# Patient Record
Sex: Female | Born: 1985 | Race: White | Hispanic: No | Marital: Married | Smoking: Never smoker
Health system: Southern US, Community
[De-identification: ages and names within clinical notes are randomized; demographics above are authoritative.]

## PROBLEM LIST (undated history)

## (undated) DIAGNOSIS — Z3490 Encounter for supervision of normal pregnancy, unspecified, unspecified trimester: Secondary | ICD-10-CM

## (undated) DIAGNOSIS — Z8619 Personal history of other infectious and parasitic diseases: Secondary | ICD-10-CM

## (undated) DIAGNOSIS — K219 Gastro-esophageal reflux disease without esophagitis: Secondary | ICD-10-CM

## (undated) DIAGNOSIS — G43909 Migraine, unspecified, not intractable, without status migrainosus: Secondary | ICD-10-CM

## (undated) DIAGNOSIS — F419 Anxiety disorder, unspecified: Secondary | ICD-10-CM

## (undated) HISTORY — DX: Migraine, unspecified, not intractable, without status migrainosus: G43.909

## (undated) HISTORY — PX: COLONOSCOPY: SHX174

## (undated) HISTORY — PX: OOPHORECTOMY: SHX86

## (undated) HISTORY — DX: Anxiety disorder, unspecified: F41.9

## (undated) HISTORY — PX: WISDOM TOOTH EXTRACTION: SHX21

## (undated) HISTORY — DX: Encounter for supervision of normal pregnancy, unspecified, unspecified trimester: Z34.90

## (undated) HISTORY — PX: OTHER SURGICAL HISTORY: SHX169

## (undated) HISTORY — DX: Personal history of other infectious and parasitic diseases: Z86.19

## (undated) HISTORY — PX: UPPER GI ENDOSCOPY: SHX6162

---

## 2012-09-19 LAB — HEPATIC FUNCTION PANEL
ALT: 9 U/L (ref 7–35)
AST: 12 U/L — AB (ref 13–35)
Alkaline Phosphatase: 40 U/L (ref 25–125)
BILIRUBIN, TOTAL: 0.7 mg/dL

## 2012-09-19 LAB — LIPID PANEL
Cholesterol: 188 mg/dL (ref 0–200)
HDL: 64 mg/dL (ref 35–70)
LDL Cholesterol: 105 mg/dL
TRIGLYCERIDES: 94 mg/dL (ref 40–160)

## 2012-09-19 LAB — CBC AND DIFFERENTIAL
HCT: 39 % (ref 36–46)
Hemoglobin: 13.2 g/dL (ref 12.0–16.0)
WBC: 5.6 10^3/mL

## 2012-09-19 LAB — BASIC METABOLIC PANEL
BUN: 11 mg/dL (ref 4–21)
CREATININE: 0.7 mg/dL (ref 0.5–1.1)
Glucose: 93 mg/dL
POTASSIUM: 4.2 mmol/L (ref 3.4–5.3)
SODIUM: 137 mmol/L (ref 137–147)

## 2012-09-19 LAB — TSH: TSH: 0.55 u[IU]/mL (ref 0.41–5.90)

## 2014-06-21 NOTE — L&D Delivery Note (Cosign Needed)
Delivery Note Pt pushed approx 2hrs and 10 mins and at 10:49 PM a viable female was delivered via Vaginal, Spontaneous Delivery (Presentation: ; Occiput Anterior).  APGAR: 8, 9; weight 7 lb 1.9 oz (3229 g).  Infant lifted to pt's abd and dried/bulb suctioned; cord clamped and cut by grandmother of baby. Placenta status: Intact, Spontaneous.  Cord: 3 vessels     Anesthesia: Epidural  Episiotomy: None Lacerations: 2nd degree;Vaginal Suture Repair: 3.0 vicryl Est. Blood Loss (mL): 150  Mom to postpartum.  Baby to Couplet care / Skin to Skin.   Serita Grammes CNM 04/04/2015, 11:22 PM

## 2014-08-20 ENCOUNTER — Ambulatory Visit (INDEPENDENT_AMBULATORY_CARE_PROVIDER_SITE_OTHER): Payer: Commercial Indemnity | Admitting: Adult Health

## 2014-08-20 ENCOUNTER — Encounter: Payer: Self-pay | Admitting: Adult Health

## 2014-08-20 VITALS — BP 124/90 | HR 72 | Ht 64.0 in | Wt 163.5 lb

## 2014-08-20 DIAGNOSIS — Z3490 Encounter for supervision of normal pregnancy, unspecified, unspecified trimester: Secondary | ICD-10-CM

## 2014-08-20 DIAGNOSIS — Z3201 Encounter for pregnancy test, result positive: Secondary | ICD-10-CM | POA: Diagnosis not present

## 2014-08-20 HISTORY — DX: Encounter for supervision of normal pregnancy, unspecified, unspecified trimester: Z34.90

## 2014-08-20 LAB — POCT URINE PREGNANCY: Preg Test, Ur: POSITIVE

## 2014-08-20 MED ORDER — PRENATAL PLUS 27-1 MG PO TABS: 1.0000 | ORAL_TABLET | Freq: Every day | ORAL | Status: DC

## 2014-08-20 NOTE — Progress Notes (Signed)
Subjective:     Patient ID: Teresa Bright, female   DOB: 05/12/1986, 29 y.o.   MRN: [id number]  HPI Teresa Bright is a 29 year old white female, married in for having missed a period, requesting a UPT.She is new to this practice.  Review of Systems  +missed period Patient denies any headaches, hearing loss, fatigue, blurred vision, shortness of breath, chest pain, abdominal pain, problems with bowel movements, urination, or intercourse. No joint pain or mood swings.She has some heartburn and has had before now.  Reviewed past medical,surgical, social and family history. Reviewed medications and allergies.     Objective:   Physical Exam BP 124/90 mmHg  Pulse 72  Ht 5\' 4"  (1.626 m)  Wt 163 lb 8 oz (74.163 kg)  BMI 28.05 kg/m2  LMP 01/13/2016UPT +, about 6+5 weeks by LMP with  EDD 04/10/15, Skin warm and dry.. Lungs: clear to ausculation bilaterally. Cardiovascular: regular rate and rhythm.Abdomen soft, non tender.    Assessment:     Pregnant +UPT    Plan:     Rx prenatal plus #30 1 daily with 11 refills Return in 2 weeks for dating [place] Review handout on first trimester and new OB packet

## 2014-08-20 NOTE — Patient Instructions (Signed)
First Trimester of Pregnancy The first trimester of pregnancy is from week 1 until the end of week 12 (months 1 through 3). A week after a sperm fertilizes an egg, the egg will implant on the wall of the uterus. This embryo will begin to develop into a baby. Genes from you and your partner are forming the baby. The female genes determine whether the baby is a boy or a girl. At 6-8 weeks, the eyes and face are formed, and the heartbeat can be seen on ultrasound. At the end of 12 weeks, all the baby's organs are formed.  Now that you are pregnant, you will want to do everything you can to have a healthy baby. Two of the most important things are to get good prenatal care and to follow your health care provider's instructions. Prenatal care is all the medical care you receive before the baby's birth. This care will help prevent, find, and treat any problems during the pregnancy and childbirth. BODY CHANGES Your body goes through many changes during pregnancy. The changes vary from woman to woman.   You may gain or lose a couple of pounds at first.  You may feel sick to your stomach (nauseous) and throw up (vomit). If the vomiting is uncontrollable, call your health care provider.  You may tire easily.  You may develop headaches that can be relieved by medicines approved by your health care provider.  You may urinate more often. Painful urination may mean you have a bladder infection.  You may develop heartburn as a result of your pregnancy.  You may develop constipation because certain hormones are causing the muscles that push waste through your intestines to slow down.  You may develop hemorrhoids or swollen, bulging veins (varicose veins).  Your breasts may begin to grow larger and become tender. Your nipples may stick out more, and the tissue that surrounds them (areola) may become darker.  Your gums may bleed and may be sensitive to brushing and flossing.  Dark spots or blotches (chloasma,  mask of pregnancy) may develop on your face. This will likely fade after the baby is born.  Your menstrual periods will stop.  You may have a loss of appetite.  You may develop cravings for certain kinds of food.  You may have changes in your emotions from day to day, such as being excited to be pregnant or being concerned that something may go wrong with the pregnancy and baby.  You may have more vivid and strange dreams.  You may have changes in your hair. These can include thickening of your hair, rapid growth, and changes in texture. Some women also have hair loss during or after pregnancy, or hair that feels dry or thin. Your hair will most likely return to normal after your baby is born. WHAT TO EXPECT AT YOUR PRENATAL VISITS During a routine prenatal visit:  You will be weighed to make sure you and the baby are growing normally.  Your blood pressure will be taken.  Your abdomen will be measured to track your baby's growth.  The fetal heartbeat will be listened to starting around week 10 or 12 of your pregnancy.  Test results from any previous visits will be discussed. Your health care provider may ask you:  How you are feeling.  If you are feeling the baby move.  If you have had any abnormal symptoms, such as leaking fluid, bleeding, severe headaches, or abdominal cramping.  If you have any questions. Other tests   that may be performed during your first trimester include:  Blood tests to find your blood type and to check for the presence of any previous infections. They will also be used to check for low iron levels (anemia) and Rh antibodies. Later in the pregnancy, blood tests for diabetes will be done along with other tests if problems develop.  Urine tests to check for infections, diabetes, or protein in the urine.  An ultrasound to confirm the proper growth and development of the baby.  An amniocentesis to check for possible genetic problems.  Fetal screens for  spina bifida and Down syndrome.  You may need other tests to make sure you and the baby are doing well. HOME CARE INSTRUCTIONS  Medicines  Follow your health care provider's instructions regarding medicine use. Specific medicines may be either safe or unsafe to take during pregnancy.  Take your prenatal vitamins as directed.  If you develop constipation, try taking a stool softener if your health care provider approves. Diet  Eat regular, well-balanced meals. Choose a variety of foods, such as meat or vegetable-based protein, fish, milk and low-fat dairy products, vegetables, fruits, and whole grain breads and cereals. Your health care provider will help you determine the amount of weight gain that is right for you.  Avoid raw meat and uncooked cheese. These carry germs that can cause birth defects in the baby.  Eating four or five small meals rather than three large meals a day may help relieve nausea and vomiting. If you start to feel nauseous, eating a few soda crackers can be helpful. Drinking liquids between meals instead of during meals also seems to help nausea and vomiting.  If you develop constipation, eat more high-fiber foods, such as fresh vegetables or fruit and whole grains. Drink enough fluids to keep your urine clear or pale yellow. Activity and Exercise  Exercise only as directed by your health care provider. Exercising will help you:  Control your weight.  Stay in shape.  Be prepared for labor and delivery.  Experiencing pain or cramping in the lower abdomen or low back is a good sign that you should stop exercising. Check with your health care provider before continuing normal exercises.  Try to avoid standing for long periods of time. Move your legs often if you must stand in one place for a long time.  Avoid heavy lifting.  Wear low-heeled shoes, and practice good posture.  You may continue to have sex unless your health care provider directs you  otherwise. Relief of Pain or Discomfort  Wear a good support bra for breast tenderness.   Take warm sitz baths to soothe any pain or discomfort caused by hemorrhoids. Use hemorrhoid cream if your health care provider approves.   Rest with your legs elevated if you have leg cramps or low back pain.  If you develop varicose veins in your legs, wear support hose. Elevate your feet for 15 minutes, 3-4 times a day. Limit salt in your diet. Prenatal Care  Schedule your prenatal visits by the twelfth week of pregnancy. They are usually scheduled monthly at first, then more often in the last 2 months before delivery.  Write down your questions. Take them to your prenatal visits.  Keep all your prenatal visits as directed by your health care provider. Safety  Wear your seat belt at all times when driving.  Make a list of emergency phone numbers, including numbers for family, friends, the hospital, and police and fire departments. General Tips    Ask your health care provider for a referral to a local prenatal education class. Begin classes no later than at the beginning of month 6 of your pregnancy.  Ask for help if you have counseling or nutritional needs during pregnancy. Your health care provider can offer advice or refer you to specialists for help with various needs.  Do not use hot tubs, steam rooms, or saunas.  Do not douche or use tampons or scented sanitary pads.  Do not cross your legs for long periods of time.  Avoid cat litter boxes and soil used by cats. These carry germs that can cause birth defects in the baby and possibly loss of the fetus by miscarriage or stillbirth.  Avoid all smoking, herbs, alcohol, and medicines not prescribed by your health care provider. Chemicals in these affect the formation and growth of the baby.  Schedule a dentist appointment. At home, brush your teeth with a soft toothbrush and be gentle when you floss. SEEK MEDICAL CARE IF:   You have  dizziness.  You have mild pelvic cramps, pelvic pressure, or nagging pain in the abdominal area.  You have persistent nausea, vomiting, or diarrhea.  You have a bad smelling vaginal discharge.  You have pain with urination.  You notice increased swelling in your face, hands, legs, or ankles. SEEK IMMEDIATE MEDICAL CARE IF:   You have a fever.  You are leaking fluid from your vagina.  You have spotting or bleeding from your vagina.  You have severe abdominal cramping or pain.  You have rapid weight gain or loss.  You vomit blood or material that looks like coffee grounds.  You are exposed to [place] measles and have never had them.  You are exposed to fifth disease or chickenpox.  You develop a severe headache.  You have shortness of breath.  You have any kind of trauma, such as from a fall or a car accident. Document Released: 06/01/2001 Document Revised: 10/22/2013 Document Reviewed: 04/17/2013 [facility] Patient Information 2015 [place], [place]. This information is not intended to replace advice given to you by your health care provider. Make sure you discuss any questions you have with your health care provider. Return in 2 weeks for dating [place]

## 2014-09-03 ENCOUNTER — Other Ambulatory Visit: Payer: Commercial Indemnity

## 2014-09-09 ENCOUNTER — Ambulatory Visit (INDEPENDENT_AMBULATORY_CARE_PROVIDER_SITE_OTHER): Payer: Commercial Indemnity

## 2014-09-09 ENCOUNTER — Other Ambulatory Visit: Payer: Self-pay | Admitting: Adult Health

## 2014-09-09 ENCOUNTER — Encounter: Payer: Self-pay | Admitting: Adult Health

## 2014-09-09 DIAGNOSIS — O3680X0 Pregnancy with inconclusive fetal viability, not applicable or unspecified: Secondary | ICD-10-CM | POA: Diagnosis not present

## 2014-09-09 DIAGNOSIS — Z3490 Encounter for supervision of normal pregnancy, unspecified, unspecified trimester: Secondary | ICD-10-CM

## 2014-09-09 NOTE — Progress Notes (Signed)
U/S(9+5wks)single fetus,  CRL c/w LMP dates, cx appears closed, bilateral adnexa appears WNL, FHR-180 bpm,

## 2014-09-19 ENCOUNTER — Telehealth: Payer: Self-pay | Admitting: *Deleted

## 2014-09-19 NOTE — Telephone Encounter (Signed)
Left message x 1. JSY 

## 2014-09-20 NOTE — Telephone Encounter (Signed)
Left message x 2. JSY 

## 2014-09-23 NOTE — Telephone Encounter (Signed)
Pt never returned call after leaving 3 messages. Encounter closed. Roland

## 2014-09-23 NOTE — Telephone Encounter (Signed)
Left message x 3. JSY 

## 2014-09-24 ENCOUNTER — Ambulatory Visit (INDEPENDENT_AMBULATORY_CARE_PROVIDER_SITE_OTHER): Payer: Commercial Indemnity | Admitting: Women's Health

## 2014-09-24 ENCOUNTER — Encounter: Payer: Self-pay | Admitting: Women's Health

## 2014-09-24 VITALS — BP 122/78 | HR 84 | Wt 166.0 lb

## 2014-09-24 DIAGNOSIS — Z3401 Encounter for supervision of normal first pregnancy, first trimester: Secondary | ICD-10-CM

## 2014-09-24 DIAGNOSIS — Z0283 Encounter for blood-alcohol and blood-drug test: Secondary | ICD-10-CM

## 2014-09-24 DIAGNOSIS — Z369 Encounter for antenatal screening, unspecified: Secondary | ICD-10-CM

## 2014-09-24 DIAGNOSIS — Z331 Pregnant state, incidental: Secondary | ICD-10-CM

## 2014-09-24 DIAGNOSIS — Z3682 Encounter for antenatal screening for nuchal translucency: Secondary | ICD-10-CM

## 2014-09-24 DIAGNOSIS — Z1389 Encounter for screening for other disorder: Secondary | ICD-10-CM

## 2014-09-24 DIAGNOSIS — Z3400 Encounter for supervision of normal first pregnancy, unspecified trimester: Secondary | ICD-10-CM | POA: Insufficient documentation

## 2014-09-24 DIAGNOSIS — Z124 Encounter for screening for malignant neoplasm of cervix: Secondary | ICD-10-CM

## 2014-09-24 LAB — OB RESULTS CONSOLE ABO/RH: RH TYPE: POSITIVE

## 2014-09-24 LAB — OB RESULTS CONSOLE HIV ANTIBODY (ROUTINE TESTING): HIV: NONREACTIVE

## 2014-09-24 LAB — OB RESULTS CONSOLE GC/CHLAMYDIA
CHLAMYDIA, DNA PROBE: NEGATIVE
Gonorrhea: NEGATIVE

## 2014-09-24 LAB — OB RESULTS CONSOLE RUBELLA ANTIBODY, IGM: RUBELLA: IMMUNE

## 2014-09-24 LAB — POCT URINALYSIS DIPSTICK
Glucose, UA: NEGATIVE
KETONES UA: NEGATIVE
Leukocytes, UA: NEGATIVE
Nitrite, UA: NEGATIVE
Protein, UA: NEGATIVE
RBC UA: NEGATIVE

## 2014-09-24 NOTE — Addendum Note (Signed)
Addended by: Traci Sermon A on: 09/24/2014 03:29 PM   Modules accepted: Orders

## 2014-09-24 NOTE — Patient Instructions (Signed)
Nausea & Vomiting  Have saltine crackers or pretzels by your bed and eat a few bites before you raise your head out of bed in the morning  Eat small frequent meals throughout the day instead of large meals  Drink plenty of fluids throughout the day to stay hydrated, just don't drink a lot of fluids with your meals.  This can make your stomach fill up faster making you feel sick  Do not brush your teeth right after you eat  Products with real ginger are good for nausea, like ginger ale and ginger hard candy Make sure it says made with real ginger!  Sucking on sour candy like lemon heads is also good for nausea  If your prenatal vitamins make you nauseated, take them at night so you will sleep through the nausea  Sea Bands  If you feel like you need medicine for the nausea & vomiting please let us know  If you are unable to keep any fluids or food down please let us know   First Trimester of Pregnancy The first trimester of pregnancy is from week 1 until the end of week 12 (months 1 through 3). A week after a sperm fertilizes an egg, the egg will implant on the wall of the uterus. This embryo will begin to develop into a baby. Genes from you and your partner are forming the baby. The female genes determine whether the baby is a boy or a girl. At 6-8 weeks, the eyes and face are formed, and the heartbeat can be seen on ultrasound. At the end of 12 weeks, all the baby's organs are formed.  Now that you are pregnant, you will want to do everything you can to have a healthy baby. Two of the most important things are to get good prenatal care and to follow your health care provider's instructions. Prenatal care is all the medical care you receive before the baby's birth. This care will help prevent, find, and treat any problems during the pregnancy and childbirth. BODY CHANGES Your body goes through many changes during pregnancy. The changes vary from woman to woman.   You may gain or lose a  couple of pounds at first.  You may feel sick to your stomach (nauseous) and throw up (vomit). If the vomiting is uncontrollable, call your health care provider.  You may tire easily.  You may develop headaches that can be relieved by medicines approved by your health care provider.  You may urinate more often. Painful urination may mean you have a bladder infection.  You may develop heartburn as a result of your pregnancy.  You may develop constipation because certain hormones are causing the muscles that push waste through your intestines to slow down.  You may develop hemorrhoids or swollen, bulging veins (varicose veins).  Your breasts may begin to grow larger and become tender. Your nipples may stick out more, and the tissue that surrounds them (areola) may become darker.  Your gums may bleed and may be sensitive to brushing and flossing.  Dark spots or blotches (chloasma, mask of pregnancy) may develop on your face. This will likely fade after the baby is born.  Your menstrual periods will stop.  You may have a loss of appetite.  You may develop cravings for certain kinds of food.  You may have changes in your emotions from day to day, such as being excited to be pregnant or being concerned that something may go wrong with the pregnancy and baby.  You may have more vivid and strange dreams.  You may have changes in your hair. These can include thickening of your hair, rapid growth, and changes in texture. Some women also have hair loss during or after pregnancy, or hair that feels dry or thin. Your hair will most likely return to normal after your baby is born. WHAT TO EXPECT AT YOUR PRENATAL VISITS During a routine prenatal visit:  You will be weighed to make sure you and the baby are growing normally.  Your blood pressure will be taken.  Your abdomen will be measured to track your baby's growth.  The fetal heartbeat will be listened to starting around week 10 or 12  of your pregnancy.  Test results from any previous visits will be discussed. Your health care provider may ask you:  How you are feeling.  If you are feeling the baby move.  If you have had any abnormal symptoms, such as leaking fluid, bleeding, severe headaches, or abdominal cramping.  If you have any questions. Other tests that may be performed during your first trimester include:  Blood tests to find your blood type and to check for the presence of any previous infections. They will also be used to check for low iron levels (anemia) and Rh antibodies. Later in the pregnancy, blood tests for diabetes will be done along with other tests if problems develop.  Urine tests to check for infections, diabetes, or protein in the urine.  An ultrasound to confirm the proper growth and development of the baby.  An amniocentesis to check for possible genetic problems.  Fetal screens for spina bifida and Down syndrome.  You may need other tests to make sure you and the baby are doing well. HOME CARE INSTRUCTIONS  Medicines  Follow your health care provider's instructions regarding medicine use. Specific medicines may be either safe or unsafe to take during pregnancy.  Take your prenatal vitamins as directed.  If you develop constipation, try taking a stool softener if your health care provider approves. Diet  Eat regular, well-balanced meals. Choose a variety of foods, such as meat or vegetable-based protein, fish, milk and low-fat dairy products, vegetables, fruits, and whole grain breads and cereals. Your health care provider will help you determine the amount of weight gain that is right for you.  Avoid raw meat and uncooked cheese. These carry germs that can cause birth defects in the baby.  Eating four or five small meals rather than three large meals a day may help relieve nausea and vomiting. If you start to feel nauseous, eating a few soda crackers can be helpful. Drinking liquids  between meals instead of during meals also seems to help nausea and vomiting.  If you develop constipation, eat more high-fiber foods, such as fresh vegetables or fruit and whole grains. Drink enough fluids to keep your urine clear or pale yellow. Activity and Exercise  Exercise only as directed by your health care provider. Exercising will help you:  Control your weight.  Stay in shape.  Be prepared for labor and delivery.  Experiencing pain or cramping in the lower abdomen or low back is a good sign that you should stop exercising. Check with your health care provider before continuing normal exercises.  Try to avoid standing for long periods of time. Move your legs often if you must stand in one place for a long time.  Avoid heavy lifting.  Wear low-heeled shoes, and practice good posture.  You may continue to have  sex unless your health care provider directs you otherwise. Relief of Pain or Discomfort  Wear a good support bra for breast tenderness.   Take warm sitz baths to soothe any pain or discomfort caused by hemorrhoids. Use hemorrhoid cream if your health care provider approves.   Rest with your legs elevated if you have leg cramps or low back pain.  If you develop varicose veins in your legs, wear support hose. Elevate your feet for 15 minutes, 3-4 times a day. Limit salt in your diet. Prenatal Care  Schedule your prenatal visits by the twelfth week of pregnancy. They are usually scheduled monthly at first, then more often in the last 2 months before delivery.  Write down your questions. Take them to your prenatal visits.  Keep all your prenatal visits as directed by your health care provider. Safety  Wear your seat belt at all times when driving.  Make a list of emergency phone numbers, including numbers for family, friends, the hospital, and police and fire departments. General Tips  Ask your health care provider for a referral to a local prenatal education  class. Begin classes no later than at the beginning of month 6 of your pregnancy.  Ask for help if you have counseling or nutritional needs during pregnancy. Your health care provider can offer advice or refer you to specialists for help with various needs.  Do not use hot tubs, steam rooms, or saunas.  Do not douche or use tampons or scented sanitary pads.  Do not cross your legs for long periods of time.  Avoid cat litter boxes and soil used by cats. These carry germs that can cause birth defects in the baby and possibly loss of the fetus by miscarriage or stillbirth.  Avoid all smoking, herbs, alcohol, and medicines not prescribed by your health care provider. Chemicals in these affect the formation and growth of the baby.  Schedule a dentist appointment. At home, brush your teeth with a soft toothbrush and be gentle when you floss. SEEK MEDICAL CARE IF:   You have dizziness.  You have mild pelvic cramps, pelvic pressure, or nagging pain in the abdominal area.  You have persistent nausea, vomiting, or diarrhea.  You have a bad smelling vaginal discharge.  You have pain with urination.  You notice increased swelling in your face, hands, legs, or ankles. SEEK IMMEDIATE MEDICAL CARE IF:   You have a fever.  You are leaking fluid from your vagina.  You have spotting or bleeding from your vagina.  You have severe abdominal cramping or pain.  You have rapid weight gain or loss.  You vomit blood or material that looks like coffee grounds.  You are exposed to [place] measles and have never had them.  You are exposed to fifth disease or chickenpox.  You develop a severe headache.  You have shortness of breath.  You have any kind of trauma, such as from a fall or a car accident. Document Released: 06/01/2001 Document Revised: 10/22/2013 Document Reviewed: 04/17/2013 [facility] Patient Information 2015 [place], [place]. This information is not intended to replace advice given  to you by your health care provider. Make sure you discuss any questions you have with your health care provider.

## 2014-09-24 NOTE — Progress Notes (Signed)
  Subjective:  Teresa Bright is a 29 y.o. G59P0 Caucasian female at [redacted]w[redacted]d by LMP c/w 9wk u/s, being seen today for her first obstetrical visit.  Her obstetrical history is significant for primigravida.  Pregnancy history fully reviewed.  Patient reports no complaints. Denies vb, cramping, uti s/s, abnormal/malodorous vag d/c, or vulvovaginal itching/irritation.  BP 122/78 mmHg  Pulse 84  Wt 166 lb (75.297 kg)  LMP 07/03/2014  HISTORY: OB History  Gravida Para Term Preterm AB SAB TAB Ectopic Multiple Living  1             # Outcome Date GA Lbr Len/2nd Weight Sex Delivery Anes PTL Lv  1 Current              Past Medical History  Diagnosis Date  . Pregnant 08/20/2014  . Medical history non-contributory    Past Surgical History  Procedure Laterality Date  . Wisdom tooth extraction     Family History  Problem Relation Age of Onset  . Cancer Maternal Grandmother     skin  . Diabetes Maternal Grandmother   . Stroke Maternal Grandfather   . Aneurysm Maternal Grandfather     brain  . Cancer Paternal Grandmother     bone and breast    Exam   System:     General: Well developed & nourished, no acute distress   Skin: Warm & dry, normal coloration and turgor, no rashes   Neurologic: Alert & oriented, normal mood   Cardiovascular: Regular rate & rhythm   Respiratory: Effort & rate normal, LCTAB, acyanotic   Abdomen: Soft, non tender   Extremities: normal strength, tone  Thin prep pap smear neg 2014 in [place]  FHR: 175 via doppler   Assessment:   Pregnancy: G1P0 Patient Active Problem List   Diagnosis Date Noted  . Supervision of normal first pregnancy 09/24/2014    Priority: High    [redacted]w[redacted]d G1P0 New OB visit  Plan:  Initial labs drawn Continue prenatal vitamins Problem list reviewed and updated Reviewed n/v relief measures and warning s/s to report Reviewed recommended weight gain based on pre-gravid BMI Encouraged well-balanced diet Genetic Screening  discussed Integrated Screen: requested Cystic fibrosis screening discussed requested Ultrasound discussed; fetal survey: requested Follow up in 1 weeks for 1st IT/NT and visit [place] completed  Tawnya Crook CNM, [facility] 09/24/2014 3:17 PM

## 2014-09-26 LAB — GC/CHLAMYDIA PROBE AMP
Chlamydia trachomatis, NAA: NEGATIVE
Neisseria gonorrhoeae by PCR: NEGATIVE

## 2014-09-26 LAB — URINE CULTURE: ORGANISM ID, BACTERIA: NO GROWTH

## 2014-09-28 LAB — PMP SCREEN PROFILE (10S), URINE
AMPHETAMINE SCRN UR: NEGATIVE ng/mL
BARBITURATE SCRN UR: NEGATIVE ng/mL
Benzodiazepine Screen, Urine: NEGATIVE ng/mL
Cannabinoids Ur Ql Scn: NEGATIVE ng/mL
Cocaine(Metab.)Screen, Urine: NEGATIVE ng/mL
Creatinine(Crt), U: 98.2 mg/dL (ref 20.0–300.0)
Methadone Scn, Ur: NEGATIVE ng/mL
Opiate Scrn, Ur: NEGATIVE ng/mL
Oxycodone+Oxymorphone Ur Ql Scn: NEGATIVE ng/mL
PCP Scrn, Ur: NEGATIVE ng/mL
PH UR, DRUG SCRN: 5.3 (ref 4.5–8.9)
PROPOXYPHENE SCREEN: NEGATIVE ng/mL

## 2014-09-28 LAB — URINALYSIS, ROUTINE W REFLEX MICROSCOPIC
Bilirubin, UA: NEGATIVE
Glucose, UA: NEGATIVE
KETONES UA: NEGATIVE
LEUKOCYTES UA: NEGATIVE
Nitrite, UA: NEGATIVE
Protein, UA: NEGATIVE
RBC, UA: NEGATIVE
SPEC GRAV UA: 1.017 (ref 1.005–1.030)
Urobilinogen, Ur: 0.2 mg/dL (ref 0.2–1.0)
pH, UA: 6 (ref 5.0–7.5)

## 2014-09-28 LAB — CBC
HCT: 39.4 % (ref 34.0–46.6)
HEMOGLOBIN: 12.8 g/dL (ref 11.1–15.9)
MCH: 29.2 pg (ref 26.6–33.0)
MCHC: 32.5 g/dL (ref 31.5–35.7)
MCV: 90 fL (ref 79–97)
Platelets: 271 10*3/uL (ref 150–379)
RBC: 4.39 x10E6/uL (ref 3.77–5.28)
RDW: 13.2 % (ref 12.3–15.4)
WBC: 8 10*3/uL (ref 3.4–10.8)

## 2014-09-28 LAB — ABO/RH: Rh Factor: POSITIVE

## 2014-09-28 LAB — ANTIBODY SCREEN: Antibody Screen: NEGATIVE

## 2014-09-28 LAB — HEPATITIS B SURFACE ANTIGEN: Hepatitis B Surface Ag: NEGATIVE

## 2014-09-28 LAB — VARICELLA ZOSTER ANTIBODY, IGG: Varicella zoster IgG: 2216 index (ref >165)

## 2014-09-28 LAB — RPR: RPR: NONREACTIVE

## 2014-09-28 LAB — CYSTIC FIBROSIS MUTATION 97: Interpretation: NOT DETECTED

## 2014-09-28 LAB — HIV ANTIBODY (ROUTINE TESTING W REFLEX): HIV Screen 4th Generation wRfx: NONREACTIVE

## 2014-09-28 LAB — RUBELLA SCREEN: RUBELLA: 2.43 {index} (ref >0.99)

## 2014-10-03 ENCOUNTER — Ambulatory Visit (INDEPENDENT_AMBULATORY_CARE_PROVIDER_SITE_OTHER): Payer: Commercial Indemnity | Admitting: Advanced Practice Midwife

## 2014-10-03 ENCOUNTER — Ambulatory Visit (INDEPENDENT_AMBULATORY_CARE_PROVIDER_SITE_OTHER): Payer: Commercial Indemnity

## 2014-10-03 ENCOUNTER — Encounter: Payer: Self-pay | Admitting: Advanced Practice Midwife

## 2014-10-03 VITALS — BP 120/60 | HR 80 | Wt 168.0 lb

## 2014-10-03 DIAGNOSIS — Z36 Encounter for antenatal screening of mother: Secondary | ICD-10-CM

## 2014-10-03 DIAGNOSIS — Z369 Encounter for antenatal screening, unspecified: Secondary | ICD-10-CM

## 2014-10-03 DIAGNOSIS — Z3401 Encounter for supervision of normal first pregnancy, first trimester: Secondary | ICD-10-CM

## 2014-10-03 DIAGNOSIS — Z3682 Encounter for antenatal screening for nuchal translucency: Secondary | ICD-10-CM

## 2014-10-03 DIAGNOSIS — O3481 Maternal care for other abnormalities of pelvic organs, first trimester: Secondary | ICD-10-CM

## 2014-10-03 DIAGNOSIS — Z331 Pregnant state, incidental: Secondary | ICD-10-CM

## 2014-10-03 DIAGNOSIS — N83201 Unspecified ovarian cyst, right side: Secondary | ICD-10-CM | POA: Insufficient documentation

## 2014-10-03 DIAGNOSIS — Z1389 Encounter for screening for other disorder: Secondary | ICD-10-CM

## 2014-10-03 DIAGNOSIS — N83209 Unspecified ovarian cyst, unspecified side: Secondary | ICD-10-CM

## 2014-10-03 LAB — POCT URINALYSIS DIPSTICK
Blood, UA: NEGATIVE
GLUCOSE UA: NEGATIVE
Ketones, UA: NEGATIVE
Leukocytes, UA: NEGATIVE
NITRITE UA: NEGATIVE
Protein, UA: NEGATIVE

## 2014-10-03 NOTE — Patient Instructions (Signed)
Ovarian Cyst (Simple) An ovarian cyst is a fluid-filled sac that forms on an ovary. The ovaries are small organs that produce eggs in women. Various types of cysts can form on the ovaries. Most are not cancerous. Many do not cause problems, and they often go away on their own. Some may cause symptoms and require treatment. Common types of ovarian cysts include:  Functional cysts--These cysts may occur every month during the menstrual cycle. This is normal. The cysts usually go away with the next menstrual cycle if the woman does not get pregnant. Usually, there are no symptoms with a functional cyst.  Endometrioma cysts--These cysts form from the tissue that lines the uterus. They are also called "chocolate cysts" because they become filled with blood that turns brown. This type of cyst can cause pain in the lower abdomen during intercourse and with your menstrual period.  Cystadenoma cysts--This type develops from the cells on the outside of the ovary. These cysts can get very big and cause lower abdomen pain and pain with intercourse. This type of cyst can twist on itself, cut off its blood supply, and cause severe pain. It can also easily rupture and cause a lot of pain.  Dermoid cysts--This type of cyst is sometimes found in both ovaries. These cysts may contain different kinds of body tissue, such as skin, teeth, hair, or cartilage. They usually do not cause symptoms unless they get very big.  Theca lutein cysts--These cysts occur when too much of a certain hormone (human chorionic gonadotropin) is produced and overstimulates the ovaries to produce an egg. This is most common after procedures used to assist with the conception of a baby (in vitro fertilization). CAUSES   Fertility drugs can cause a condition in which multiple large cysts are formed on the ovaries. This is called ovarian hyperstimulation syndrome.  A condition called polycystic ovary syndrome can cause hormonal imbalances that  can lead to nonfunctional ovarian cysts. SIGNS AND SYMPTOMS  Many ovarian cysts do not cause symptoms. If symptoms are present, they may include:  Pelvic pain or pressure.  Pain in the lower abdomen.  Pain during sexual intercourse.  Increasing girth (swelling) of the abdomen.  Abnormal menstrual periods.  Increasing pain with menstrual periods.  Stopping having menstrual periods without being pregnant. DIAGNOSIS  These cysts are commonly found during a routine or annual pelvic exam. Tests may be ordered to find out more about the cyst. These tests may include:  Ultrasound.  X-ray of the pelvis.  CT scan.  MRI.  Blood tests. TREATMENT  Many ovarian cysts go away on their own without treatment. Your health care provider may want to check your cyst regularly for 2-3 months to see if it changes. For women in menopause, it is particularly important to monitor a cyst closely because of the higher rate of ovarian cancer in menopausal women. When treatment is needed, it may include any of the following:  A procedure to drain the cyst (aspiration). This may be done using a long needle and ultrasound. It can also be done through a laparoscopic procedure. This involves using a thin, lighted tube with a tiny camera on the end (laparoscope) inserted through a small incision.  Surgery to remove the whole cyst. This may be done using laparoscopic surgery or an open surgery involving a larger incision in the lower abdomen.  Hormone treatment or birth control pills. These methods are sometimes used to help dissolve a cyst. HOME CARE INSTRUCTIONS   Only take  over-the-counter or prescription medicines as directed by your health care provider.  Follow up with your health care provider as directed.  Get regular pelvic exams and Pap tests. SEEK MEDICAL CARE IF:   Your periods are late, irregular, or painful, or they stop.  Your pelvic pain or abdominal pain does not go away.  Your  abdomen becomes larger or swollen.  You have pressure on your bladder or trouble emptying your bladder completely.  You have pain during sexual intercourse.  You have feelings of fullness, pressure, or discomfort in your stomach.  You lose weight for no apparent reason.  You feel generally ill.  You become constipated.  You lose your appetite.  You develop acne.  You have an increase in body and facial hair.  You are gaining weight, without changing your exercise and eating habits.  You think you are pregnant. SEEK IMMEDIATE MEDICAL CARE IF:   You have increasing abdominal pain.  You feel sick to your stomach (nauseous), and you throw up (vomit).  You develop a fever that comes on suddenly.  You have abdominal pain during a bowel movement.  Your menstrual periods become heavier than usual. MAKE SURE YOU:  Understand these instructions.  Will watch your condition.  Will get help right away if you are not doing well or get worse. Document Released: 06/07/2005 Document Revised: 06/12/2013 Document Reviewed: 02/12/2013 [facility] Patient Information 2015 [place], [place]. This information is not intended to replace advice given to you by your health care provider. Make sure you discuss any questions you have with your health care provider.

## 2014-10-03 NOTE — Progress Notes (Signed)
Pt denies any problems or concerns at this time.  

## 2014-10-03 NOTE — Progress Notes (Signed)
G1P0 [redacted]w[redacted]d Estimated Date of Delivery: 04/09/15  Last menstrual period 07/03/2014.   BP weight and urine results all reviewed and noted.  Please refer to the obstetrical flow sheet for the fundal height and fetal heart rate documentation: NT/IT today:  [place] [redacted]w[redacted]d c/w dates,nt 1.20mm,nb present,RT ov simple cyst 11.1x7.4x9.6cm,normal lt ov,pos fht 158bpm  Patient denies any bleeding and no rupture of membranes symptoms or regular contractions. She is completely asymptomatic as far as the cyst goes Patient is without complaints. All questions were answered.  Plan:  Continued routine obstetrical care  Follow up in 4 weeks for OB appointment, 2nd IT

## 2014-10-03 NOTE — Progress Notes (Signed)
[place] [redacted]w[redacted]d c/w dates,nt 1.64mm,nb present,RT ov simple cyst 11.1x7.4x9.6cm,normal lt ov,pos fht 158bpm

## 2014-10-05 LAB — MATERNAL SCREEN, INTEGRATED #1
CROWN RUMP LENGTH MAT SCREEN: 71.2 mm
GEST. AGE ON COLLECTION DATE: 13 wk
Maternal Age at EDD: 30 years
Nuchal Translucency (NT): 1.4 mm
Number of Fetuses: 1
PAPP-A Value: 361.5 ng/mL
Weight: 168 [lb_av]

## 2014-10-31 ENCOUNTER — Ambulatory Visit (INDEPENDENT_AMBULATORY_CARE_PROVIDER_SITE_OTHER): Payer: Commercial Indemnity | Admitting: Advanced Practice Midwife

## 2014-10-31 VITALS — BP 112/78 | HR 72 | Wt 177.0 lb

## 2014-10-31 DIAGNOSIS — Z3402 Encounter for supervision of normal first pregnancy, second trimester: Secondary | ICD-10-CM

## 2014-10-31 DIAGNOSIS — Z363 Encounter for antenatal screening for malformations: Secondary | ICD-10-CM

## 2014-10-31 DIAGNOSIS — Z1389 Encounter for screening for other disorder: Secondary | ICD-10-CM

## 2014-10-31 DIAGNOSIS — Z331 Pregnant state, incidental: Secondary | ICD-10-CM

## 2014-10-31 LAB — POCT URINALYSIS DIPSTICK
Blood, UA: NEGATIVE
Glucose, UA: NEGATIVE
KETONES UA: NEGATIVE
Nitrite, UA: NEGATIVE
Protein, UA: NEGATIVE

## 2014-10-31 NOTE — Patient Instructions (Signed)
[place] [employer] based

## 2014-10-31 NOTE — Addendum Note (Signed)
Addended by: Doyne Keel on: 10/31/2014 02:23 PM   Modules accepted: Orders

## 2014-10-31 NOTE — Progress Notes (Signed)
G1P0 [redacted]w[redacted]d Estimated Date of Delivery: 04/09/15  Blood pressure 112/78, pulse 72, weight 177 lb (80.287 kg), last menstrual period 07/03/2014.   BP weight and urine results all reviewed and noted.  Please refer to the obstetrical flow sheet for the fundal height and fetal heart rate documentation:  Patient denies any bleeding and no rupture of membranes symptoms or regular contractions. Patient is without complaints. All questions were answered.  Plan:  Continued routine obstetrical care, 2nd it  Follow up in 2 weeks for OB appointment, anatomy scan

## 2014-11-02 LAB — MATERNAL SCREEN, INTEGRATED #2
ADSF: 1.18
AFP MARKER: 27.8 ng/mL
AFP MoM: 0.87
CROWN RUMP LENGTH: 71.2 mm
DIA MOM: 0.29
DIA VALUE: 46.6 pg/mL
Estriol, Unconjugated: 1.18 ng/mL
Gest. Age on Collection Date: 13 weeks
Gestational Age: 17 weeks
MATERNAL AGE AT EDD: 30 a
NUMBER OF FETUSES: 1
Nuchal Translucency (NT): 1.4 mm
Nuchal Translucency MoM: 0.88
PAPP-A MoM: 0.34
PAPP-A Value: 361.5 ng/mL
Test Results:: NEGATIVE
Weight: 168 [lb_av]
Weight: 177 [lb_av]
hCG MoM: 0.49
hCG Value: 12.5 IU/mL

## 2014-11-14 ENCOUNTER — Ambulatory Visit (INDEPENDENT_AMBULATORY_CARE_PROVIDER_SITE_OTHER): Payer: Commercial Indemnity

## 2014-11-14 ENCOUNTER — Ambulatory Visit (INDEPENDENT_AMBULATORY_CARE_PROVIDER_SITE_OTHER): Payer: Commercial Indemnity | Admitting: Advanced Practice Midwife

## 2014-11-14 ENCOUNTER — Encounter: Payer: Self-pay | Admitting: Advanced Practice Midwife

## 2014-11-14 ENCOUNTER — Other Ambulatory Visit: Payer: Self-pay | Admitting: Advanced Practice Midwife

## 2014-11-14 VITALS — BP 120/70 | HR 84 | Wt 169.0 lb

## 2014-11-14 DIAGNOSIS — Z331 Pregnant state, incidental: Secondary | ICD-10-CM

## 2014-11-14 DIAGNOSIS — Z36 Encounter for antenatal screening of mother: Secondary | ICD-10-CM | POA: Diagnosis not present

## 2014-11-14 DIAGNOSIS — O3482 Maternal care for other abnormalities of pelvic organs, second trimester: Principal | ICD-10-CM

## 2014-11-14 DIAGNOSIS — N83201 Unspecified ovarian cyst, right side: Secondary | ICD-10-CM

## 2014-11-14 DIAGNOSIS — Z3402 Encounter for supervision of normal first pregnancy, second trimester: Secondary | ICD-10-CM

## 2014-11-14 DIAGNOSIS — N83209 Unspecified ovarian cyst, unspecified side: Secondary | ICD-10-CM

## 2014-11-14 DIAGNOSIS — Z1389 Encounter for screening for other disorder: Secondary | ICD-10-CM

## 2014-11-14 DIAGNOSIS — Z363 Encounter for antenatal screening for malformations: Secondary | ICD-10-CM

## 2014-11-14 LAB — POCT URINALYSIS DIPSTICK
Blood, UA: NEGATIVE
Glucose, UA: NEGATIVE
KETONES UA: NEGATIVE
LEUKOCYTES UA: NEGATIVE
Nitrite, UA: NEGATIVE
PROTEIN UA: NEGATIVE

## 2014-11-14 NOTE — Progress Notes (Signed)
Pt denies any problems or concerns at this time.  

## 2014-11-14 NOTE — Progress Notes (Signed)
[place] 19+1wks measurements c/w dates,simple rt ov cyst 9.2 x 8.6 x 7.6cm venous and art flow seen,fht 154bpm,sdp of fluid 5.1cm,4.7 cm cx, fundal pl gr 0,limited view of spine,no obvious abn seen,efw 293g

## 2014-11-14 NOTE — Progress Notes (Signed)
G1P0 [redacted]w[redacted]d Estimated Date of Delivery: 04/09/15  Last menstrual period 07/03/2014.   BP weight and urine results all reviewed and noted.  Please refer to the obstetrical flow sheet for the fundal height and fetal heart rate documentation: Anatomy scan today:  Normal appearing female, but couldn't see all of spine.  Cyst decreased to 9cms  Patient reports good fetal movement, denies any bleeding and no rupture of membranes symptoms or regular contractions. Patient is without complaints. All questions were answered.  Plan:  Continued routine obstetrical care, recheck anatomy 28 weeks  Follow up in 4 weeks for OB appointment,

## 2014-11-16 LAB — US OB COMP + 14 WK

## 2014-12-12 ENCOUNTER — Ambulatory Visit (INDEPENDENT_AMBULATORY_CARE_PROVIDER_SITE_OTHER): Payer: Commercial Indemnity | Admitting: Obstetrics & Gynecology

## 2014-12-12 ENCOUNTER — Encounter: Payer: Self-pay | Admitting: Obstetrics & Gynecology

## 2014-12-12 VITALS — BP 128/80 | HR 80 | Wt 178.0 lb

## 2014-12-12 DIAGNOSIS — Z1389 Encounter for screening for other disorder: Secondary | ICD-10-CM

## 2014-12-12 DIAGNOSIS — Z331 Pregnant state, incidental: Secondary | ICD-10-CM

## 2014-12-12 DIAGNOSIS — Z3492 Encounter for supervision of normal pregnancy, unspecified, second trimester: Secondary | ICD-10-CM

## 2014-12-12 LAB — POCT URINALYSIS DIPSTICK
Blood, UA: NEGATIVE
Glucose, UA: NEGATIVE
Ketones, UA: NEGATIVE
Leukocytes, UA: NEGATIVE
Nitrite, UA: NEGATIVE
Protein, UA: NEGATIVE

## 2014-12-12 NOTE — Progress Notes (Signed)
G1P0 [redacted]w[redacted]d Estimated Date of Delivery: 04/09/15  Blood pressure 128/80, pulse 80, weight 178 lb (80.74 kg), last menstrual period 07/03/2014.   BP weight and urine results all reviewed and noted.  Please refer to the obstetrical flow sheet for the fundal height and fetal heart rate documentation:  Patient reports good fetal movement, denies any bleeding and no rupture of membranes symptoms or regular contractions. Patient is without complaints. All questions were answered.  Plan:  Continued routine obstetrical care,   Follow up in 4 weeks for OB appointment,

## 2015-01-08 ENCOUNTER — Other Ambulatory Visit: Payer: Self-pay | Admitting: Obstetrics & Gynecology

## 2015-01-08 DIAGNOSIS — IMO0002 Reserved for concepts with insufficient information to code with codable children: Secondary | ICD-10-CM

## 2015-01-08 DIAGNOSIS — Z0489 Encounter for examination and observation for other specified reasons: Secondary | ICD-10-CM

## 2015-01-09 ENCOUNTER — Ambulatory Visit (INDEPENDENT_AMBULATORY_CARE_PROVIDER_SITE_OTHER): Payer: Commercial Indemnity | Admitting: Advanced Practice Midwife

## 2015-01-09 ENCOUNTER — Other Ambulatory Visit: Payer: Self-pay | Admitting: Obstetrics & Gynecology

## 2015-01-09 ENCOUNTER — Ambulatory Visit (INDEPENDENT_AMBULATORY_CARE_PROVIDER_SITE_OTHER): Payer: Commercial Indemnity

## 2015-01-09 ENCOUNTER — Other Ambulatory Visit: Payer: Commercial Indemnity

## 2015-01-09 VITALS — BP 114/56 | HR 71 | Wt 184.0 lb

## 2015-01-09 DIAGNOSIS — O3482 Maternal care for other abnormalities of pelvic organs, second trimester: Principal | ICD-10-CM

## 2015-01-09 DIAGNOSIS — Z331 Pregnant state, incidental: Secondary | ICD-10-CM

## 2015-01-09 DIAGNOSIS — Z36 Encounter for antenatal screening of mother: Secondary | ICD-10-CM

## 2015-01-09 DIAGNOSIS — IMO0002 Reserved for concepts with insufficient information to code with codable children: Secondary | ICD-10-CM

## 2015-01-09 DIAGNOSIS — N83201 Unspecified ovarian cyst, right side: Secondary | ICD-10-CM

## 2015-01-09 DIAGNOSIS — Z131 Encounter for screening for diabetes mellitus: Secondary | ICD-10-CM

## 2015-01-09 DIAGNOSIS — N83209 Unspecified ovarian cyst, unspecified side: Secondary | ICD-10-CM

## 2015-01-09 DIAGNOSIS — Z0489 Encounter for examination and observation for other specified reasons: Secondary | ICD-10-CM

## 2015-01-09 DIAGNOSIS — Z3402 Encounter for supervision of normal first pregnancy, second trimester: Secondary | ICD-10-CM

## 2015-01-09 DIAGNOSIS — Z369 Encounter for antenatal screening, unspecified: Secondary | ICD-10-CM

## 2015-01-09 DIAGNOSIS — N8320 Unspecified ovarian cysts: Secondary | ICD-10-CM | POA: Diagnosis not present

## 2015-01-09 DIAGNOSIS — Z1389 Encounter for screening for other disorder: Secondary | ICD-10-CM

## 2015-01-09 LAB — POCT URINALYSIS DIPSTICK
Blood, UA: NEGATIVE
KETONES UA: NEGATIVE
NITRITE UA: NEGATIVE

## 2015-01-09 NOTE — Progress Notes (Signed)
G1P0 [redacted]w[redacted]d Estimated Date of Delivery: 04/09/15  Blood pressure 114/56, pulse 71, weight 184 lb (83.462 kg), last menstrual period 07/03/2014.   BP weight and urine results all reviewed and noted.  Please refer to the obstetrical flow sheet for the fundal height and fetal heart rate documentation:Us 27+1wks,measurements c/w dates,post fundal pl,spinal anatomy complete w/no obvious abn seen, simple rt ov cyst w/venous and art flow seen 9.7x 7.3 x 9.4cm,normal lt ov,sdp of fluid 3.3HL,KT 4.4cm,cephalic,fht 625WLS  Patient reports good fetal movement, denies any bleeding and no rupture of membranes symptoms or regular contractions. Patient is without complaints. All questions were answered.  Plan:  Continued routine obstetrical care, PN2 today  Follow up in 3 weeks for OB appointment,

## 2015-01-09 NOTE — Progress Notes (Signed)
[place] 27+1wks,measurements c/w dates,post fundal pl,spinal anatomy complete w/no obvious abn seen, simple rt ov cyst w/venous and art flow seen 9.7x 7.3 x 9.4cm,normal lt ov,sdp of fluid 9.7QB,HA 4.4cm,cephalic,fht 193XTK

## 2015-01-10 LAB — GLUCOSE TOLERANCE, 2 HOURS W/ 1HR
GLUCOSE, 1 HOUR: 161 mg/dL (ref 65–179)
GLUCOSE, FASTING: 77 mg/dL (ref 65–91)
Glucose, 2 hour: 113 mg/dL (ref 65–152)

## 2015-01-10 LAB — CBC
HEMATOCRIT: 36.3 % (ref 34.0–46.6)
Hemoglobin: 12.2 g/dL (ref 11.1–15.9)
MCH: 30.3 pg (ref 26.6–33.0)
MCHC: 33.6 g/dL (ref 31.5–35.7)
MCV: 90 fL (ref 79–97)
Platelets: 239 10*3/uL (ref 150–379)
RBC: 4.02 x10E6/uL (ref 3.77–5.28)
RDW: 13.3 % (ref 12.3–15.4)
WBC: 8 10*3/uL (ref 3.4–10.8)

## 2015-01-10 LAB — HSV 2 ANTIBODY, IGG

## 2015-01-10 LAB — HIV ANTIBODY (ROUTINE TESTING W REFLEX): HIV SCREEN 4TH GENERATION: NONREACTIVE

## 2015-01-10 LAB — RPR: RPR: NONREACTIVE

## 2015-01-10 LAB — ANTIBODY SCREEN: Antibody Screen: NEGATIVE

## 2015-01-14 ENCOUNTER — Telehealth: Payer: Self-pay | Admitting: Women's Health

## 2015-01-22 ENCOUNTER — Telehealth: Payer: Self-pay | Admitting: Women's Health

## 2015-01-22 NOTE — Telephone Encounter (Signed)
Pt states what can she take for allergies at 29 weeks of pregnancy. Pt informed she can take OTC Zyrtec or Claritin. Pt verbalized understanding.

## 2015-01-29 ENCOUNTER — Ambulatory Visit (INDEPENDENT_AMBULATORY_CARE_PROVIDER_SITE_OTHER): Payer: Commercial Indemnity | Admitting: Women's Health

## 2015-01-29 ENCOUNTER — Encounter: Payer: Self-pay | Admitting: Women's Health

## 2015-01-29 VITALS — BP 102/64 | HR 80 | Wt 192.0 lb

## 2015-01-29 DIAGNOSIS — Z1389 Encounter for screening for other disorder: Secondary | ICD-10-CM

## 2015-01-29 DIAGNOSIS — Z3403 Encounter for supervision of normal first pregnancy, third trimester: Secondary | ICD-10-CM

## 2015-01-29 DIAGNOSIS — Z331 Pregnant state, incidental: Secondary | ICD-10-CM

## 2015-01-29 LAB — POCT URINALYSIS DIPSTICK
Blood, UA: NEGATIVE
Glucose, UA: NEGATIVE
Ketones, UA: NEGATIVE
Nitrite, UA: NEGATIVE
PROTEIN UA: NEGATIVE

## 2015-01-29 NOTE — Patient Instructions (Signed)
[place] Pediatricians/Family Doctors:  [facility] [phone]                 [place] [phone] (usually not accepting new patients unless you have family there already, you are always welcome to call and ask)            [facility] ([place]) [phone]   [facility] Pediatricians/Family Doctors:   [place]: [phone]  [facility]: [phone]   Call the office [phone]) or go to [facility] if:  You begin to have strong, frequent contractions  Your water breaks.  Sometimes it is a big gush of fluid, sometimes it is just a trickle that keeps getting your panties wet or running down your legs  You have vaginal bleeding.  It is normal to have a small amount of spotting if your cervix was checked.   You don't feel your baby moving like normal.  If you don't, get you something to eat and drink and lay down and focus on feeling your baby move.  You should feel at least 10 movements in 2 hours.  If you don't, you should call the office or go to [facility].    Tdap Vaccine  It is recommended that you get the Tdap vaccine during the third trimester of EACH pregnancy to help protect your baby from getting pertussis (whooping cough)  27-36 weeks is the BEST time to do this so that you can pass the protection on to your baby. During pregnancy is better than after pregnancy, but if you are unable to get it during pregnancy it will be offered at the hospital.   You can get this vaccine at the health department or your family doctor  Everyone who will be around your baby should also be up-to-date on their vaccines. Adults (who are not pregnant) only need 1 dose of Tdap during adulthood.   Third Trimester of Pregnancy The third trimester is from week 29 through week 42, months 7 through 9. The third trimester is a time when the fetus  is growing rapidly. At the end of the ninth month, the fetus is about 20 inches in length and weighs 6-10 pounds.  BODY CHANGES Your body goes through many changes during pregnancy. The changes vary from woman to woman.   Your weight will continue to increase. You can expect to gain 25-35 pounds (11-16 kg) by the end of the pregnancy.  You may begin to get stretch marks on your hips, abdomen, and breasts.  You may urinate more often because the fetus is moving lower into your pelvis and pressing on your bladder.  You may develop or continue to have heartburn as a result of your pregnancy.  You may develop constipation because certain hormones are causing the muscles that push waste through your intestines to slow down.  You may develop hemorrhoids or swollen, bulging veins (varicose veins).  You may have pelvic pain because of the weight gain and pregnancy hormones relaxing your joints between the bones in your pelvis. Backaches may result from overexertion of the muscles supporting your posture.  You may have changes in your hair. These can include thickening of your hair, rapid growth, and changes in texture. Some women also have hair loss during or after pregnancy, or hair that feels dry or thin. Your hair will most likely return to normal after your  baby is born.  Your breasts will continue to grow and be tender. A yellow discharge may leak from your breasts called colostrum.  Your belly button may stick out.  You may feel short of breath because of your expanding uterus.  You may notice the fetus "dropping," or moving lower in your abdomen.  You may have a bloody mucus discharge. This usually occurs a few days to a week before labor begins.  Your cervix becomes thin and soft (effaced) near your due date. WHAT TO EXPECT AT YOUR PRENATAL EXAMS  You will have prenatal exams every 2 weeks until week 36. Then, you will have weekly prenatal exams. During a routine prenatal  visit:  You will be weighed to make sure you and the fetus are growing normally.  Your blood pressure is taken.  Your abdomen will be measured to track your baby's growth.  The fetal heartbeat will be listened to.  Any test results from the previous visit will be discussed.  You may have a cervical check near your due date to see if you have effaced. At around 36 weeks, your caregiver will check your cervix. At the same time, your caregiver will also perform a test on the secretions of the vaginal tissue. This test is to determine if a type of bacteria, Group B streptococcus, is present. Your caregiver will explain this further. Your caregiver may ask you:  What your birth plan is.  How you are feeling.  If you are feeling the baby move.  If you have had any abnormal symptoms, such as leaking fluid, bleeding, severe headaches, or abdominal cramping.  If you have any questions. Other tests or screenings that may be performed during your third trimester include:  Blood tests that check for low iron levels (anemia).  Fetal testing to check the health, activity level, and growth of the fetus. Testing is done if you have certain medical conditions or if there are problems during the pregnancy. FALSE LABOR You may feel small, irregular contractions that eventually go away. These are called Braxton Hicks contractions, or false labor. Contractions may last for hours, days, or even weeks before true labor sets in. If contractions come at regular intervals, intensify, or become painful, it is best to be seen by your caregiver.  SIGNS OF LABOR   Menstrual-like cramps.  Contractions that are 5 minutes apart or less.  Contractions that start on the top of the uterus and spread down to the lower abdomen and back.  A sense of increased pelvic pressure or back pain.  A watery or bloody mucus discharge that comes from the vagina. If you have any of these signs before the 37th week of  pregnancy, call your caregiver right away. You need to go to the hospital to get checked immediately. HOME CARE INSTRUCTIONS   Avoid all smoking, herbs, alcohol, and unprescribed drugs. These chemicals affect the formation and growth of the baby.  Follow your caregiver's instructions regarding medicine use. There are medicines that are either safe or unsafe to take during pregnancy.  Exercise only as directed by your caregiver. Experiencing uterine cramps is a good sign to stop exercising.  Continue to eat regular, healthy meals.  Wear a good support bra for breast tenderness.  Do not use hot tubs, steam rooms, or saunas.  Wear your seat belt at all times when driving.  Avoid raw meat, uncooked cheese, cat litter boxes, and soil used by cats. These carry germs that can cause birth defects  in the baby.  Take your prenatal vitamins.  Try taking a stool softener (if your caregiver approves) if you develop constipation. Eat more high-fiber foods, such as fresh vegetables or fruit and whole grains. Drink plenty of fluids to keep your urine clear or pale yellow.  Take warm sitz baths to soothe any pain or discomfort caused by hemorrhoids. Use hemorrhoid cream if your caregiver approves.  If you develop varicose veins, wear support hose. Elevate your feet for 15 minutes, 3-4 times a day. Limit salt in your diet.  Avoid heavy lifting, wear low heal shoes, and practice good posture.  Rest a lot with your legs elevated if you have leg cramps or low back pain.  Visit your dentist if you have not gone during your pregnancy. Use a soft toothbrush to brush your teeth and be gentle when you floss.  A sexual relationship may be continued unless your caregiver directs you otherwise.  Do not travel far distances unless it is absolutely necessary and only with the approval of your caregiver.  Take prenatal classes to understand, practice, and ask questions about the labor and delivery.  Make a  trial run to the hospital.  Pack your hospital bag.  Prepare the baby's nursery.  Continue to go to all your prenatal visits as directed by your caregiver. SEEK MEDICAL CARE IF:  You are unsure if you are in labor or if your water has broken.  You have dizziness.  You have mild pelvic cramps, pelvic pressure, or nagging pain in your abdominal area.  You have persistent nausea, vomiting, or diarrhea.  You have a bad smelling vaginal discharge.  You have pain with urination. SEEK IMMEDIATE MEDICAL CARE IF:   You have a fever.  You are leaking fluid from your vagina.  You have spotting or bleeding from your vagina.  You have severe abdominal cramping or pain.  You have rapid weight loss or gain.  You have shortness of breath with chest pain.  You notice sudden or extreme swelling of your face, hands, ankles, feet, or legs.  You have not felt your baby move in over an hour.  You have severe headaches that do not go away with medicine.  You have vision changes. Document Released: 06/01/2001 Document Revised: 06/12/2013 Document Reviewed: 08/08/2012 [facility] Patient Information 2015 [place], [place]. This information is not intended to replace advice given to you by your health care provider. Make sure you discuss any questions you have with your health care provider.

## 2015-01-29 NOTE — Progress Notes (Signed)
Low-risk OB appointment G1P0 [redacted]w[redacted]d Estimated Date of Delivery: 04/09/15 BP 102/64 mmHg  Pulse 80  Wt 192 lb (87.091 kg)  LMP 07/03/2014  BP, weight, and urine reviewed.  Refer to obstetrical flow sheet for FH & FHR.  Reports good fm.  Denies regular uc's, lof, vb, or uti s/s. No complaints. Reviewed pn2 results, ptl s/s, fkc. Plan:  Continue routine obstetrical care  F/U in 2wks for OB appointment

## 2015-02-12 ENCOUNTER — Ambulatory Visit (INDEPENDENT_AMBULATORY_CARE_PROVIDER_SITE_OTHER): Payer: Commercial Indemnity | Admitting: Advanced Practice Midwife

## 2015-02-12 VITALS — BP 120/86 | HR 84 | Wt 194.0 lb

## 2015-02-12 DIAGNOSIS — Z3403 Encounter for supervision of normal first pregnancy, third trimester: Secondary | ICD-10-CM

## 2015-02-13 NOTE — Progress Notes (Signed)
G1P0 [redacted]w[redacted]d Estimated Date of Delivery: 04/09/15  Blood pressure 120/86, pulse 84, weight 194 lb (87.998 kg), last menstrual period 07/03/2014.   BP weight and urine results all reviewed and noted.  Please refer to the obstetrical flow sheet for the fundal height and fetal heart rate documentation:  Patient reports good fetal movement, denies any bleeding and no rupture of membranes symptoms or regular contractions. Patient is without complaints. All questions were answered.  Plan:  Continued routine obstetrical care,   Follow up in 2 weeks for OB appointment,

## 2015-02-25 ENCOUNTER — Encounter: Payer: Self-pay | Admitting: Women's Health

## 2015-02-25 ENCOUNTER — Ambulatory Visit (INDEPENDENT_AMBULATORY_CARE_PROVIDER_SITE_OTHER): Payer: Commercial Indemnity | Admitting: Women's Health

## 2015-02-25 VITALS — BP 110/62 | HR 88 | Wt 195.0 lb

## 2015-02-25 DIAGNOSIS — Z331 Pregnant state, incidental: Secondary | ICD-10-CM

## 2015-02-25 DIAGNOSIS — Z1389 Encounter for screening for other disorder: Secondary | ICD-10-CM

## 2015-02-25 DIAGNOSIS — Z3403 Encounter for supervision of normal first pregnancy, third trimester: Secondary | ICD-10-CM

## 2015-02-25 LAB — POCT URINALYSIS DIPSTICK
Blood, UA: NEGATIVE
GLUCOSE UA: NEGATIVE
Ketones, UA: NEGATIVE
Nitrite, UA: NEGATIVE
PROTEIN UA: NEGATIVE

## 2015-02-25 NOTE — Patient Instructions (Signed)
Call the office ([phone]) or go to [facility] if:  You begin to have strong, frequent contractions  Your water breaks.  Sometimes it is a big gush of fluid, sometimes it is just a trickle that keeps getting your panties wet or running down your legs  You have vaginal bleeding.  It is normal to have a small amount of spotting if your cervix was checked.   You don't feel your baby moving like normal.  If you don't, get you something to eat and drink and lay down and focus on feeling your baby move.  You should feel at least 10 movements in 2 hours.  If you don't, you should call the office or go to [facility].    Preterm Labor Information Preterm labor is when labor starts at less than 37 weeks of pregnancy. The normal length of a pregnancy is 39 to 41 weeks. CAUSES Often, there is no identifiable underlying cause as to why a woman goes into preterm labor. One of the most common known causes of preterm labor is infection. Infections of the uterus, cervix, vagina, amniotic sac, bladder, kidney, or even the lungs (pneumonia) can cause labor to start. Other suspected causes of preterm labor include:   Urogenital infections, such as yeast infections and bacterial vaginosis.   Uterine abnormalities (uterine shape, uterine septum, fibroids, or bleeding from the placenta).   A cervix that has been operated on (it may fail to stay closed).   Malformations in the fetus.   Multiple gestations (twins, triplets, and so on).   Breakage of the amniotic sac.  RISK FACTORS  Having a previous history of preterm labor.   Having premature rupture of membranes (PROM).   Having a placenta that covers the opening of the cervix (placenta previa).   Having a placenta that separates from the uterus (placental abruption).   Having a cervix that is too weak to hold the fetus in the uterus (incompetent cervix).   Having too much fluid in the amniotic sac (polyhydramnios).   Taking  illegal drugs or smoking while pregnant.   Not gaining enough weight while pregnant.   Being younger than 18 and older than 30 years old.   Having a low socioeconomic status.   Being African American. SYMPTOMS Signs and symptoms of preterm labor include:   Menstrual-like cramps, abdominal pain, or back pain.  Uterine contractions that are regular, as frequent as six in an hour, regardless of their intensity (may be mild or painful).  Contractions that start on the top of the uterus and spread down to the lower abdomen and back.   A sense of increased pelvic pressure.   A watery or bloody mucus discharge that comes from the vagina.  TREATMENT Depending on the length of the pregnancy and other circumstances, your health care provider may suggest bed rest. If necessary, there are medicines that can be given to stop contractions and to mature the fetal lungs. If labor happens before 34 weeks of pregnancy, a prolonged hospital stay may be recommended. Treatment depends on the condition of both you and the fetus.  WHAT SHOULD YOU DO IF YOU THINK YOU ARE IN PRETERM LABOR? Call your health care provider right away. You will need to go to the hospital to get checked immediately. HOW CAN YOU PREVENT PRETERM LABOR IN FUTURE PREGNANCIES? You should:   Stop smoking if you smoke.  Maintain healthy weight gain and avoid chemicals and drugs that are not necessary.  Be watchful for   any type of infection.  Inform your health care provider if you have a known history of preterm labor. Document Released: 08/28/2003 Document Revised: 02/07/2013 Document Reviewed: 07/10/2012 [employer] Patient Information 2015 [employer]. This information is not intended to replace advice given to you by your health care provider. Make sure you discuss any questions you have with your health care provider.  

## 2015-02-25 NOTE — Progress Notes (Signed)
Low-risk OB appointment G1P0 [redacted]w[redacted]d Estimated Date of Delivery: 04/09/15 BP 110/62 mmHg  Pulse 88  Wt 195 lb (88.451 kg)  LMP 07/03/2014  BP, weight, and urine reviewed.  Refer to obstetrical flow sheet for FH & FHR.  Reports good fm.  Denies regular uc's, lof, vb, or uti s/s. No complaints. Reviewed ptl s/s, fkc. Plan:  Continue routine obstetrical care  F/U in 2wks for OB appointment

## 2015-03-11 ENCOUNTER — Ambulatory Visit (INDEPENDENT_AMBULATORY_CARE_PROVIDER_SITE_OTHER): Payer: Commercial Indemnity | Admitting: Women's Health

## 2015-03-11 ENCOUNTER — Encounter: Payer: Self-pay | Admitting: Women's Health

## 2015-03-11 VITALS — BP 116/60 | HR 80 | Wt 200.0 lb

## 2015-03-11 DIAGNOSIS — Z331 Pregnant state, incidental: Secondary | ICD-10-CM

## 2015-03-11 DIAGNOSIS — Z3403 Encounter for supervision of normal first pregnancy, third trimester: Secondary | ICD-10-CM

## 2015-03-11 DIAGNOSIS — Z1389 Encounter for screening for other disorder: Secondary | ICD-10-CM

## 2015-03-11 LAB — POCT URINALYSIS DIPSTICK
GLUCOSE UA: NEGATIVE
Ketones, UA: NEGATIVE
Nitrite, UA: NEGATIVE
RBC UA: NEGATIVE

## 2015-03-11 NOTE — Patient Instructions (Signed)
Call the office ([phone]) or go to [facility] if:  You begin to have strong, frequent contractions  Your water breaks.  Sometimes it is a big gush of fluid, sometimes it is just a trickle that keeps getting your panties wet or running down your legs  You have vaginal bleeding.  It is normal to have a small amount of spotting if your cervix was checked.   You don't feel your baby moving like normal.  If you don't, get you something to eat and drink and lay down and focus on feeling your baby move.  You should feel at least 10 movements in 2 hours.  If you don't, you should call the office or go to [facility].    Preterm Labor Information Preterm labor is when labor starts at less than 37 weeks of pregnancy. The normal length of a pregnancy is 39 to 41 weeks. CAUSES Often, there is no identifiable underlying cause as to why a woman goes into preterm labor. One of the most common known causes of preterm labor is infection. Infections of the uterus, cervix, vagina, amniotic sac, bladder, kidney, or even the lungs (pneumonia) can cause labor to start. Other suspected causes of preterm labor include:   Urogenital infections, such as yeast infections and bacterial vaginosis.   Uterine abnormalities (uterine shape, uterine septum, fibroids, or bleeding from the placenta).   A cervix that has been operated on (it may fail to stay closed).   Malformations in the fetus.   Multiple gestations (twins, triplets, and so on).   Breakage of the amniotic sac.  RISK FACTORS  Having a previous history of preterm labor.   Having premature rupture of membranes (PROM).   Having a placenta that covers the opening of the cervix (placenta previa).   Having a placenta that separates from the uterus (placental abruption).   Having a cervix that is too weak to hold the fetus in the uterus (incompetent cervix).   Having too much fluid in the amniotic sac (polyhydramnios).   Taking  illegal drugs or smoking while pregnant.   Not gaining enough weight while pregnant.   Being younger than 18 and older than 30 years old.   Having a low socioeconomic status.   Being African American. SYMPTOMS Signs and symptoms of preterm labor include:   Menstrual-like cramps, abdominal pain, or back pain.  Uterine contractions that are regular, as frequent as six in an hour, regardless of their intensity (may be mild or painful).  Contractions that start on the top of the uterus and spread down to the lower abdomen and back.   A sense of increased pelvic pressure.   A watery or bloody mucus discharge that comes from the vagina.  TREATMENT Depending on the length of the pregnancy and other circumstances, your health care provider may suggest bed rest. If necessary, there are medicines that can be given to stop contractions and to mature the fetal lungs. If labor happens before 34 weeks of pregnancy, a prolonged hospital stay may be recommended. Treatment depends on the condition of both you and the fetus.  WHAT SHOULD YOU DO IF YOU THINK YOU ARE IN PRETERM LABOR? Call your health care provider right away. You will need to go to the hospital to get checked immediately. HOW CAN YOU PREVENT PRETERM LABOR IN FUTURE PREGNANCIES? You should:   Stop smoking if you smoke.  Maintain healthy weight gain and avoid chemicals and drugs that are not necessary.  Be watchful for   any type of infection.  Inform your health care provider if you have a known history of preterm labor. Document Released: 08/28/2003 Document Revised: 02/07/2013 Document Reviewed: 07/10/2012 [employer] Patient Information 2015 [employer]. This information is not intended to replace advice given to you by your health care provider. Make sure you discuss any questions you have with your health care provider.  

## 2015-03-11 NOTE — Progress Notes (Signed)
Low-risk OB appointment G1P0 [redacted]w[redacted]d Estimated Date of Delivery: 04/09/15 BP 116/60 mmHg  Pulse 80  Wt 200 lb (90.719 kg)  LMP 07/03/2014  BP, weight, and urine reviewed.  Refer to obstetrical flow sheet for FH & FHR.  Reports good fm.  Denies regular uc's, lof, vb, or uti s/s. 37lb wt gain of recommended 15-25lb. Discussed decreasing carbs, increasing water/exercise.  Reviewed ptl s/s, fkc. Plan:  Continue routine obstetrical care  F/U in 1wk for OB appointment and gbs

## 2015-03-20 ENCOUNTER — Encounter: Payer: Self-pay | Admitting: Advanced Practice Midwife

## 2015-03-20 ENCOUNTER — Ambulatory Visit (INDEPENDENT_AMBULATORY_CARE_PROVIDER_SITE_OTHER): Payer: Commercial Indemnity | Admitting: Advanced Practice Midwife

## 2015-03-20 VITALS — BP 110/72 | HR 84 | Wt 202.0 lb

## 2015-03-20 DIAGNOSIS — Z1389 Encounter for screening for other disorder: Secondary | ICD-10-CM

## 2015-03-20 DIAGNOSIS — Z369 Encounter for antenatal screening, unspecified: Secondary | ICD-10-CM

## 2015-03-20 DIAGNOSIS — Z23 Encounter for immunization: Secondary | ICD-10-CM | POA: Diagnosis not present

## 2015-03-20 DIAGNOSIS — Z331 Pregnant state, incidental: Secondary | ICD-10-CM

## 2015-03-20 DIAGNOSIS — Z3403 Encounter for supervision of normal first pregnancy, third trimester: Secondary | ICD-10-CM

## 2015-03-20 LAB — OB RESULTS CONSOLE GBS: GBS: NEGATIVE

## 2015-03-20 LAB — POCT URINALYSIS DIPSTICK
Glucose, UA: NEGATIVE
Ketones, UA: NEGATIVE
Leukocytes, UA: NEGATIVE
Nitrite, UA: NEGATIVE
PROTEIN UA: NEGATIVE
RBC UA: NEGATIVE

## 2015-03-20 NOTE — Progress Notes (Signed)
G1P0 [redacted]w[redacted]d Estimated Date of Delivery: 04/09/15  Blood pressure 110/72, pulse 84, weight 202 lb (91.627 kg), last menstrual period 07/03/2014.   BP weight and urine results all reviewed and noted.  Please refer to the obstetrical flow sheet for the fundal height and fetal heart rate documentation:  Patient reports good fetal movement, denies any bleeding and no rupture of membranes symptoms or regular contractions. Patient is without complaints. All questions were answered.  Orders Placed This Encounter  Procedures  . GC/Chlamydia Probe Amp  . Strep Gp B NAA+Rflx  . POCT urinalysis dipstick    Plan:  Continued routine obstetrical care, GBS today  Return in about 1 week (around 03/27/2015) for LROB.

## 2015-03-20 NOTE — Progress Notes (Signed)
Pt states she still has some sensitivity at her bottom, possible hemorrhoid.

## 2015-03-22 LAB — STREP GP B NAA+RFLX: Strep Gp B NAA+Rflx: NEGATIVE

## 2015-03-22 LAB — GC/CHLAMYDIA PROBE AMP
CHLAMYDIA, DNA PROBE: NEGATIVE
Neisseria gonorrhoeae by PCR: NEGATIVE

## 2015-03-26 ENCOUNTER — Ambulatory Visit (INDEPENDENT_AMBULATORY_CARE_PROVIDER_SITE_OTHER): Payer: Commercial Indemnity | Admitting: Women's Health

## 2015-03-26 ENCOUNTER — Encounter: Payer: Self-pay | Admitting: Women's Health

## 2015-03-26 VITALS — BP 118/62 | HR 72 | Wt 205.0 lb

## 2015-03-26 DIAGNOSIS — Z1389 Encounter for screening for other disorder: Secondary | ICD-10-CM

## 2015-03-26 DIAGNOSIS — Z331 Pregnant state, incidental: Secondary | ICD-10-CM

## 2015-03-26 DIAGNOSIS — Z3403 Encounter for supervision of normal first pregnancy, third trimester: Secondary | ICD-10-CM

## 2015-03-26 LAB — POCT URINALYSIS DIPSTICK
GLUCOSE UA: NEGATIVE
NITRITE UA: NEGATIVE
Protein, UA: NEGATIVE
RBC UA: NEGATIVE

## 2015-03-26 NOTE — Progress Notes (Signed)
Low-risk OB appointment G1P0 [redacted]w[redacted]d Estimated Date of Delivery: 04/09/15 BP 118/62 mmHg  Pulse 72  Wt 205 lb (92.987 kg)  LMP 07/03/2014  BP, weight, and urine reviewed.  Refer to obstetrical flow sheet for FH & FHR.  Reports good fm.  Denies regular uc's, lof, vb, or uti s/s. No complaints. Reviewed gbs-, labor s/s, fkc. Plan:  Continue routine obstetrical care  F/U in 1wk for OB appointment

## 2015-03-26 NOTE — Patient Instructions (Signed)
Call the office ([phone]) or go to [facility] if:  You begin to have strong, frequent contractions  Your water breaks.  Sometimes it is a big gush of fluid, sometimes it is just a trickle that keeps getting your panties wet or running down your legs  You have vaginal bleeding.  It is normal to have a small amount of spotting if your cervix was checked.   You don't feel your baby moving like normal.  If you don't, get you something to eat and drink and lay down and focus on feeling your baby move.  You should feel at least 10 movements in 2 hours.  If you don't, you should call the office or go to [facility].    Braxton Hicks Contractions Contractions of the uterus can occur throughout pregnancy. Contractions are not always a sign that you are in labor.  WHAT ARE BRAXTON HICKS CONTRACTIONS?  Contractions that occur before labor are called Braxton Hicks contractions, or false labor. Toward the end of pregnancy (32-34 weeks), these contractions can develop more often and may become more forceful. This is not true labor because these contractions do not result in opening (dilatation) and thinning of the cervix. They are sometimes difficult to tell apart from true labor because these contractions can be forceful and people have different pain tolerances. You should not feel embarrassed if you go to the hospital with false labor. Sometimes, the only way to tell if you are in true labor is for your health care provider to look for changes in the cervix. If there are no prenatal problems or other health problems associated with the pregnancy, it is completely safe to be sent home with false labor and await the onset of true labor. HOW CAN YOU TELL THE DIFFERENCE BETWEEN TRUE AND FALSE LABOR? False Labor  The contractions of false labor are usually shorter and not as hard as those of true labor.   The contractions are usually irregular.   The contractions are often felt in the front of  the lower abdomen and in the groin.   The contractions may go away when you walk around or change positions while lying down.   The contractions get weaker and are shorter lasting as time goes on.   The contractions do not usually become progressively stronger, regular, and closer together as with true labor.  True Labor  Contractions in true labor last 30-70 seconds, become very regular, usually become more intense, and increase in frequency.   The contractions do not go away with walking.   The discomfort is usually felt in the top of the uterus and spreads to the lower abdomen and low back.   True labor can be determined by your health care provider with an exam. This will show that the cervix is dilating and getting thinner.  WHAT TO REMEMBER  Keep up with your usual exercises and follow other instructions given by your health care provider.   Take medicines as directed by your health care provider.   Keep your regular prenatal appointments.   Eat and drink lightly if you think you are going into labor.   If Braxton Hicks contractions are making you uncomfortable:   Change your position from lying down or resting to walking, or from walking to resting.   Sit and rest in a tub of warm water.   Drink 2-3 glasses of water. Dehydration may cause these contractions.   Do slow and deep breathing several times an hour.    WHEN SHOULD I SEEK IMMEDIATE MEDICAL CARE? Seek immediate medical care if:  Your contractions become stronger, more regular, and closer together.   You have fluid leaking or gushing from your vagina.   You have a fever.   You pass blood-tinged mucus.   You have vaginal bleeding.   You have continuous abdominal pain.   You have low back pain that you never had before.   You feel your baby's head pushing down and causing pelvic pressure.   Your baby is not moving as much as it used to.    This information is not intended to  replace advice given to you by your health care provider. Make sure you discuss any questions you have with your health care provider.   Document Released: 06/07/2005 Document Revised: 06/12/2013 Document Reviewed: 03/19/2013 Elsevier Interactive Patient Education [employer].

## 2015-04-02 ENCOUNTER — Encounter: Payer: Self-pay | Admitting: Women's Health

## 2015-04-02 ENCOUNTER — Ambulatory Visit (INDEPENDENT_AMBULATORY_CARE_PROVIDER_SITE_OTHER): Payer: Commercial Indemnity | Admitting: Women's Health

## 2015-04-02 VITALS — BP 122/80 | HR 80 | Wt 205.5 lb

## 2015-04-02 DIAGNOSIS — Z3A39 39 weeks gestation of pregnancy: Secondary | ICD-10-CM | POA: Diagnosis not present

## 2015-04-02 DIAGNOSIS — Z3403 Encounter for supervision of normal first pregnancy, third trimester: Secondary | ICD-10-CM

## 2015-04-02 DIAGNOSIS — Z331 Pregnant state, incidental: Secondary | ICD-10-CM

## 2015-04-02 DIAGNOSIS — Z1389 Encounter for screening for other disorder: Secondary | ICD-10-CM

## 2015-04-02 LAB — POCT URINALYSIS DIPSTICK
GLUCOSE UA: NEGATIVE
Ketones, UA: NEGATIVE
LEUKOCYTES UA: NEGATIVE
NITRITE UA: NEGATIVE
Protein, UA: NEGATIVE
RBC UA: NEGATIVE

## 2015-04-02 NOTE — Progress Notes (Signed)
Low-risk OB appointment G1P0 [redacted]w[redacted]d Estimated Date of Delivery: 04/09/15 BP 122/80 mmHg  Pulse 80  Wt 205 lb 8 oz (93.214 kg)  LMP 07/03/2014  BP, weight, and urine reviewed.  Refer to obstetrical flow sheet for FH & FHR.  Reports good fm.  Denies regular uc's, lof, vb, or uti s/s. No complaints. SVE per request: 1/th/-3, vtx Reviewed labor s/s, fkc. Plan:  Continue routine obstetrical care  F/U in 1wk for OB appointment

## 2015-04-02 NOTE — Patient Instructions (Signed)
Call the office ([phone]) or go to [facility] if:  You begin to have strong, frequent contractions  Your water breaks.  Sometimes it is a big gush of fluid, sometimes it is just a trickle that keeps getting your panties wet or running down your legs  You have vaginal bleeding.  It is normal to have a small amount of spotting if your cervix was checked.   You don't feel your baby moving like normal.  If you don't, get you something to eat and drink and lay down and focus on feeling your baby move.  You should feel at least 10 movements in 2 hours.  If you don't, you should call the office or go to [facility].    Braxton Hicks Contractions Contractions of the uterus can occur throughout pregnancy. Contractions are not always a sign that you are in labor.  WHAT ARE BRAXTON HICKS CONTRACTIONS?  Contractions that occur before labor are called Braxton Hicks contractions, or false labor. Toward the end of pregnancy (32-34 weeks), these contractions can develop more often and may become more forceful. This is not true labor because these contractions do not result in opening (dilatation) and thinning of the cervix. They are sometimes difficult to tell apart from true labor because these contractions can be forceful and people have different pain tolerances. You should not feel embarrassed if you go to the hospital with false labor. Sometimes, the only way to tell if you are in true labor is for your health care provider to look for changes in the cervix. If there are no prenatal problems or other health problems associated with the pregnancy, it is completely safe to be sent home with false labor and await the onset of true labor. HOW CAN YOU TELL THE DIFFERENCE BETWEEN TRUE AND FALSE LABOR? False Labor  The contractions of false labor are usually shorter and not as hard as those of true labor.   The contractions are usually irregular.   The contractions are often felt in the front of  the lower abdomen and in the groin.   The contractions may go away when you walk around or change positions while lying down.   The contractions get weaker and are shorter lasting as time goes on.   The contractions do not usually become progressively stronger, regular, and closer together as with true labor.  True Labor  Contractions in true labor last 30-70 seconds, become very regular, usually become more intense, and increase in frequency.   The contractions do not go away with walking.   The discomfort is usually felt in the top of the uterus and spreads to the lower abdomen and low back.   True labor can be determined by your health care provider with an exam. This will show that the cervix is dilating and getting thinner.  WHAT TO REMEMBER  Keep up with your usual exercises and follow other instructions given by your health care provider.   Take medicines as directed by your health care provider.   Keep your regular prenatal appointments.   Eat and drink lightly if you think you are going into labor.   If Braxton Hicks contractions are making you uncomfortable:   Change your position from lying down or resting to walking, or from walking to resting.   Sit and rest in a tub of warm water.   Drink 2-3 glasses of water. Dehydration may cause these contractions.   Do slow and deep breathing several times an hour.    WHEN SHOULD I SEEK IMMEDIATE MEDICAL CARE? Seek immediate medical care if:  Your contractions become stronger, more regular, and closer together.   You have fluid leaking or gushing from your vagina.   You have a fever.   You pass blood-tinged mucus.   You have vaginal bleeding.   You have continuous abdominal pain.   You have low back pain that you never had before.   You feel your baby's head pushing down and causing pelvic pressure.   Your baby is not moving as much as it used to.    This information is not intended to  replace advice given to you by your health care provider. Make sure you discuss any questions you have with your health care provider.   Document Released: 06/07/2005 Document Revised: 06/12/2013 Document Reviewed: 03/19/2013 Elsevier Interactive Patient Education [employer].

## 2015-04-04 ENCOUNTER — Inpatient Hospital Stay (HOSPITAL_COMMUNITY)
Admission: AD | Admit: 2015-04-04 | Discharge: 2015-04-06 | DRG: 775 | Disposition: A | Discharge status: Home / Self Care | Payer: Managed Care, Other (non HMO) | Source: Ambulatory Visit | Attending: Obstetrics and Gynecology | Admitting: Obstetrics and Gynecology

## 2015-04-04 ENCOUNTER — Inpatient Hospital Stay (HOSPITAL_COMMUNITY): Payer: Managed Care, Other (non HMO) | Admitting: Anesthesiology

## 2015-04-04 ENCOUNTER — Encounter (HOSPITAL_COMMUNITY): Payer: Self-pay | Admitting: *Deleted

## 2015-04-04 DIAGNOSIS — O3483 Maternal care for other abnormalities of pelvic organs, third trimester: Secondary | ICD-10-CM

## 2015-04-04 DIAGNOSIS — O99214 Obesity complicating childbirth: Secondary | ICD-10-CM | POA: Diagnosis present

## 2015-04-04 DIAGNOSIS — Z6835 Body mass index (BMI) 35.0-35.9, adult: Secondary | ICD-10-CM | POA: Diagnosis not present

## 2015-04-04 DIAGNOSIS — Z3A39 39 weeks gestation of pregnancy: Secondary | ICD-10-CM

## 2015-04-04 DIAGNOSIS — N83209 Unspecified ovarian cyst, unspecified side: Secondary | ICD-10-CM

## 2015-04-04 DIAGNOSIS — Z3403 Encounter for supervision of normal first pregnancy, third trimester: Secondary | ICD-10-CM

## 2015-04-04 DIAGNOSIS — IMO0001 Reserved for inherently not codable concepts without codable children: Secondary | ICD-10-CM

## 2015-04-04 HISTORY — DX: Gastro-esophageal reflux disease without esophagitis: K21.9

## 2015-04-04 LAB — TYPE AND SCREEN
ABO/RH(D): O POS
Antibody Screen: NEGATIVE

## 2015-04-04 LAB — ABO/RH: ABO/RH(D): O POS

## 2015-04-04 LAB — CBC
HEMATOCRIT: 41 % (ref 36.0–46.0)
HEMOGLOBIN: 14 g/dL (ref 12.0–15.0)
MCH: 30 pg (ref 26.0–34.0)
MCHC: 34.1 g/dL (ref 30.0–36.0)
MCV: 88 fL (ref 78.0–100.0)
Platelets: 246 10*3/uL (ref 150–400)
RBC: 4.66 MIL/uL (ref 3.87–5.11)
RDW: 13.2 % (ref 11.5–15.5)
WBC: 13.9 10*3/uL — AB (ref 4.0–10.5)

## 2015-04-04 MED ORDER — OXYTOCIN BOLUS FROM INFUSION
500.0000 mL | INTRAVENOUS | Status: DC
  Administered 2015-04-04: 500 mL via INTRAVENOUS

## 2015-04-04 MED ORDER — FENTANYL 2.5 MCG/ML BUPIVACAINE 1/10 % EPIDURAL INFUSION (WH - ANES)
14.0000 mL/h | INTRAMUSCULAR | Status: DC | PRN
  Administered 2015-04-04: 14 mL/h via EPIDURAL
  Filled 2015-04-04: qty 125

## 2015-04-04 MED ORDER — FENTANYL 2.5 MCG/ML BUPIVACAINE 1/10 % EPIDURAL INFUSION (WH - ANES)
INTRAMUSCULAR | Status: DC | PRN
  Administered 2015-04-04: 14 mL/h via EPIDURAL

## 2015-04-04 MED ORDER — OXYCODONE-ACETAMINOPHEN 5-325 MG PO TABS: 2.0000 | ORAL_TABLET | ORAL | Status: DC | PRN

## 2015-04-04 MED ORDER — LIDOCAINE HCL (PF) 1 % IJ SOLN
30.0000 mL | INTRAMUSCULAR | Status: DC | PRN
  Filled 2015-04-04: qty 30

## 2015-04-04 MED ORDER — FENTANYL 2.5 MCG/ML BUPIVACAINE 1/10 % EPIDURAL INFUSION (WH - ANES): 14.0000 mL/h | INTRAMUSCULAR | Status: DC | PRN

## 2015-04-04 MED ORDER — LIDOCAINE HCL (PF) 1 % IJ SOLN
INTRAMUSCULAR | Status: DC | PRN
  Administered 2015-04-04 (×2): 5 mL

## 2015-04-04 MED ORDER — ONDANSETRON HCL 4 MG/2ML IJ SOLN: 4.0000 mg | Freq: Four times a day (QID) | INTRAMUSCULAR | Status: DC | PRN

## 2015-04-04 MED ORDER — PRENATAL PLUS 27-1 MG PO TABS: 1.0000 | ORAL_TABLET | Freq: Every day | ORAL | Status: DC

## 2015-04-04 MED ORDER — EPHEDRINE 5 MG/ML INJ: 10.0000 mg | INTRAVENOUS | Status: DC | PRN

## 2015-04-04 MED ORDER — FLEET ENEMA 7-19 GM/118ML RE ENEM: 1.0000 | ENEMA | RECTAL | Status: DC | PRN

## 2015-04-04 MED ORDER — OXYTOCIN 40 UNITS IN LACTATED RINGERS INFUSION - SIMPLE MED
62.5000 mL/h | INTRAVENOUS | Status: DC
  Filled 2015-04-04: qty 1000

## 2015-04-04 MED ORDER — LACTATED RINGERS IV SOLN
INTRAVENOUS | Status: DC
  Administered 2015-04-04 (×2): via INTRAVENOUS

## 2015-04-04 MED ORDER — ACETAMINOPHEN 325 MG PO TABS
650.0000 mg | ORAL_TABLET | ORAL | Status: DC | PRN
  Administered 2015-04-04: 650 mg via ORAL
  Filled 2015-04-04: qty 2

## 2015-04-04 MED ORDER — OXYCODONE-ACETAMINOPHEN 5-325 MG PO TABS: 1.0000 | ORAL_TABLET | ORAL | Status: DC | PRN

## 2015-04-04 MED ORDER — CITRIC ACID-SODIUM CITRATE 334-500 MG/5ML PO SOLN
30.0000 mL | ORAL | Status: DC | PRN
  Administered 2015-04-04: 30 mL via ORAL
  Filled 2015-04-04: qty 15

## 2015-04-04 MED ORDER — LACTATED RINGERS IV SOLN
500.0000 mL | INTRAVENOUS | Status: DC | PRN
Start: 2015-04-04 — End: 2015-04-05

## 2015-04-04 MED ORDER — DIPHENHYDRAMINE HCL 50 MG/ML IJ SOLN
12.5000 mg | INTRAMUSCULAR | Status: DC | PRN
Start: 2015-04-04 — End: 2015-04-05

## 2015-04-04 MED ORDER — PHENYLEPHRINE 40 MCG/ML (10ML) SYRINGE FOR IV PUSH (FOR BLOOD PRESSURE SUPPORT)
80.0000 ug | PREFILLED_SYRINGE | INTRAVENOUS | Status: DC | PRN
  Filled 2015-04-04: qty 20

## 2015-04-04 NOTE — H&P (Signed)
Chief Complaint:  Labor Eval  HPI: Teresa Bright is a 29 y.o. G1P0 at [redacted]w[redacted]d who presents to maternity admissions reporting contractions starting ~4am this morning. Denies leakage of fluid. Contractions have increased in intensity over time. In the MAU patient had a notable change in cervical dilation and some bloody show. Denies flank blood. No fevers, chills, HA, vision change, or leg pain.  Patient admitted to the L&D suites. FOB at bedside.  Pregnancy Course:  [place]  Initiated Care at  [place], 42yo, 2nd baby, married  Dating By LMP c/w 9wk u/s  Pap 2014 neg in [place]  GC/CT Initial: -/-               36+wks:-/-  Genetic Screen NT/IT: normal  CF screen neg  Anatomic [place] Normal female  Flu vaccine 03/20/15  Tdap Recommended ~ 28wks, got 03/21/15  Glucose Screen  2 hr normal: 77/161/113  GBS neg  Feed Preference breast  Contraception Possibly nexplanon  Circumcision Yes at [place] Registered for cb & bf classes at [facility]!  Pediatrician [place]     Past Medical History: Past Medical History  Diagnosis Date  . Pregnant 08/20/2014  . GERD (gastroesophageal reflux disease)     Past obstetric history: OB History  Gravida Para Term Preterm AB SAB TAB Ectopic Multiple Living  1             # Outcome Date GA Lbr Len/2nd Weight Sex Delivery Anes PTL Lv  1 Current               Past Surgical History: Past Surgical History  Procedure Laterality Date  . Wisdom tooth extraction       Family History: Family History  Problem Relation Age of Onset  . Cancer Maternal Grandmother     skin  . Diabetes Maternal Grandmother   . Stroke Maternal Grandfather   . Aneurysm Maternal Grandfather     brain  . Cancer Paternal Grandmother     bone and breast    Social History: Social History  Substance Use Topics  . Smoking status: Never Smoker   . Smokeless tobacco: Never Used  . Alcohol Use: No     Comment:  occassionally prior to pregnancy    Allergies: No Known Allergies  Meds:  Prescriptions prior to admission  Medication Sig Dispense Refill Last Dose  . calcium carbonate (TUMS - DOSED IN MG ELEMENTAL CALCIUM) 500 MG chewable tablet Chew 2 tablets by mouth 3 (three) times daily as needed for heartburn.    04/03/2015 at Unknown time  . Omega-3 Fatty Acids (OVEGA-3) 500 MG CAPS Take 1 capsule by mouth daily.   04/03/2015 at Unknown time  . prenatal vitamin w/FE, FA (PRENATAL 1 + 1) 27-1 MG TABS tablet Take 1 tablet by mouth daily at 12 noon. 30 each 11 04/03/2015 at Unknown time    ROS: Pertinent findings in history of present illness.  Physical Exam  Blood pressure 124/82, pulse 100, temperature 98.2 F (36.8 C), temperature source Oral, resp. rate 18, last menstrual period 07/03/2014. GENERAL: Well-developed, well-nourished female in no acute distress.  HEENT: normocephalic HEART: normal rate RESP: normal effort ABDOMEN:  gravid appropriate for gestational age 70: alert and oriented Dilation: 5.5 Effacement (%): 100 Cervical Position: Middle Station: -2 Presentation: Vertex Exam by:: Velna Ochs RN  FHT:  Baseline 145, moderate variability, no decelerations Contractions: q 3-4 mins   Labs: No results found for  this or any previous visit (from the past 24 hour(s)).  Imaging:  No results found.  Assessment: 1. Labor: active, spontaneous 2. Fetal Wellbeing: Category 1  3. Pain Control: Epidural desired 4. GBS: neg 5. 39.2 week IUP  Plan:  1. Admit to BS per consult with MD 2. Routine L&D orders 3. Analgesia/anesthesia PRN      Medication List    ASK your doctor about these medications        calcium carbonate 500 MG chewable tablet  Commonly known as:  TUMS - dosed in mg elemental calcium  Chew 2 tablets by mouth 3 (three) times daily as needed for heartburn.     OVEGA-3 500 MG Caps  Take 1 capsule by mouth daily.     prenatal vitamin w/FE, FA 27-1 MG Tabs  tablet  Take 1 tablet by mouth daily at 12 noon.        Elberta Leatherwood, MD 04/04/2015 3:13 PM   OB fellow attestation: I have seen and examined this patient; I agree with above documentation in the resident's note.   Teresa Bright is a 29 y.o. G1P0 here for SOL  PE: BP 131/88 mmHg  Pulse 100  Temp(Src) 98.2 F (36.8 C) (Oral)  Resp 20  Ht 5\' 4"  (1.626 m)  Wt 205 lb (92.987 kg)  BMI 35.17 kg/m2  LMP 07/03/2014 Gen: calm comfortable, NAD Resp: normal effort, no distress Abd: gravid  ROS, labs, PMH reviewed  Plan:  1. Admit to BS, expectant management.  2. Routine L&D orders 3. Analgesia/anesthesia PRN   Caren Macadam, MD Family Medicine, OB Fellow 04/04/2015, 4:08 PM

## 2015-04-04 NOTE — Progress Notes (Signed)
Teresa Bright is a 29 y.o. G1P0 at [redacted]w[redacted]d   Subjective:  Pushing x 36mins now; making good progress  Objective: BP 110/76 mmHg  Pulse 98  Temp(Src) 100 F (37.8 C) (Axillary)  Resp 18  Ht 5\' 4"  (1.626 m)  Wt 92.987 kg (205 lb)  BMI 35.17 kg/m2  SpO2 97%  LMP 07/03/2014      FHT:  FHR: 140s bpm, variability: moderate,  accelerations:  Present,  decelerations:  Absent- occ mi variables UC:   regular, every 2 minutes SVE:   Dilation: 10 Effacement (%): 100 Station: +2 Exam by:: Rochel Brome, RN   Labs: Lab Results  Component Value Date   WBC 13.9* 04/04/2015   HGB 14.0 04/04/2015   HCT 41.0 04/04/2015   MCV 88.0 04/04/2015   PLT 246 04/04/2015    Assessment / Plan: End 1st stage  Keep pushing w/ contractions Anticipate SVD  Teresa Bright CNM 04/04/2015, 9:17 PM

## 2015-04-04 NOTE — Anesthesia Preprocedure Evaluation (Signed)
Anesthesia Evaluation  Patient identified by MRN, date of birth, ID band Patient awake and Patient confused    Reviewed: Allergy & Precautions, H&P , NPO status , Patient's Chart, lab work & pertinent test results  Airway Mallampati: II       Dental   Pulmonary    Pulmonary exam normal breath sounds clear to auscultation       Cardiovascular Exercise Tolerance: Good Normal cardiovascular exam Rhythm:regular Rate:Normal     Neuro/Psych    GI/Hepatic   Endo/Other  Morbid obesity  Renal/GU      Musculoskeletal   Abdominal   Peds  Hematology   Anesthesia Other Findings   Reproductive/Obstetrics (+) Pregnancy                             Anesthesia Physical Anesthesia Plan  ASA: II  Anesthesia Plan: Epidural   Post-op Pain Management:    Induction:   Airway Management Planned:   Additional Equipment:   Intra-op Plan:   Post-operative Plan:   Informed Consent: I have reviewed the patients History and Physical, chart, labs and discussed the procedure including the risks, benefits and alternatives for the proposed anesthesia with the patient or authorized representative who has indicated his/her understanding and acceptance.     Plan Discussed with:   Anesthesia Plan Comments:         Anesthesia Quick Evaluation

## 2015-04-04 NOTE — MAU Note (Signed)
Been contracting since 0400, now every 5-7 min.  No bleeding or leaking, passed mucous plug.  Was 1 cm on Wed.

## 2015-04-04 NOTE — Progress Notes (Signed)
Kyleah Pensabene is a 29 y.o. G1P0 at [redacted]w[redacted]d admitted for active labor  Subjective: Patient progressing very well. Family at bedside. Epidural in place w/ adequate pain relief. Bulging bag noted by nursing. Agreeable w/ AROM.  Objective: BP 126/83 mmHg  Pulse 92  Temp(Src) 98.1 F (36.7 C) (Oral)  Resp 20  Ht 5\' 4"  (1.626 m)  Wt 92.987 kg (205 lb)  BMI 35.17 kg/m2  SpO2 97%  LMP 07/03/2014   Gen: NAD, A/O FHT:  FHR: 145 bpm, variability: moderate,  accelerations:  Present,  decelerations:  Absent UC:   regular, every 2 minutes  SVE:   Dilation: Lip/rim Effacement (%): 100 Station: 0 Exam by:: McKeag  Labs: Lab Results  Component Value Date   WBC 13.9* 04/04/2015   HGB 14.0 04/04/2015   HCT 41.0 04/04/2015   MCV 88.0 04/04/2015   PLT 246 04/04/2015    Assessment / Plan: Spontaneous labor, progressing normally  AROM performed. Patient tolerated well. Light Meconium stained amniotic fluid noted.   Labor: Progressing normally Fetal Wellbeing:  Category I Pain Control:  Epidural Pre-eclampsia: no I/D:  GBS neg Anticipated MOD:  NSVD  Elberta Leatherwood, MD,MS,  PGY2 04/04/2015 6:12 PM

## 2015-04-04 NOTE — Anesthesia Procedure Notes (Signed)
Epidural Patient location during procedure: OB Start time: 04/04/2015 4:16 PM End time: 04/04/2015 4:32 PM  Staffing Anesthesiologist: Alexis Frock  Preanesthetic Checklist Completed: patient identified, site marked, surgical consent, pre-op evaluation, timeout performed, IV checked, risks and benefits discussed and monitors and equipment checked  Epidural Patient position: sitting Prep: site prepped and draped and DuraPrep Patient monitoring: heart rate, continuous pulse ox and blood pressure Approach: midline Location: L3-L4 Injection technique: LOR air  Needle:  Needle type: Tuohy  Needle gauge: 17 G Needle length: 9 cm and 9 Needle insertion depth: 5 cm Catheter type: closed end flexible Catheter size: 20 Guage Catheter at skin depth: 14 cm Test dose: negative  Assessment Events: blood not aspirated, injection not painful, no injection resistance, negative IV test and no paresthesia  Additional Notes   Patient tolerated the insertion well without complications.Reason for block:procedure for pain

## 2015-04-05 LAB — RPR: RPR: NONREACTIVE

## 2015-04-05 MED ORDER — DIBUCAINE 1 % RE OINT
1.0000 "application " | TOPICAL_OINTMENT | RECTAL | Status: DC | PRN
  Filled 2015-04-05: qty 28

## 2015-04-05 MED ORDER — OXYCODONE-ACETAMINOPHEN 5-325 MG PO TABS: 2.0000 | ORAL_TABLET | ORAL | Status: DC | PRN

## 2015-04-05 MED ORDER — IBUPROFEN 600 MG PO TABS
600.0000 mg | ORAL_TABLET | Freq: Four times a day (QID) | ORAL | Status: DC
  Administered 2015-04-05 – 2015-04-06 (×5): 600 mg via ORAL
  Filled 2015-04-05 (×6): qty 1

## 2015-04-05 MED ORDER — ONDANSETRON HCL 4 MG/2ML IJ SOLN: 4.0000 mg | INTRAMUSCULAR | Status: DC | PRN

## 2015-04-05 MED ORDER — SIMETHICONE 80 MG PO CHEW: 80.0000 mg | CHEWABLE_TABLET | ORAL | Status: DC | PRN

## 2015-04-05 MED ORDER — DIPHENHYDRAMINE HCL 25 MG PO CAPS: 25.0000 mg | ORAL_CAPSULE | Freq: Four times a day (QID) | ORAL | Status: DC | PRN

## 2015-04-05 MED ORDER — OXYCODONE-ACETAMINOPHEN 5-325 MG PO TABS
1.0000 | ORAL_TABLET | ORAL | Status: DC | PRN
  Administered 2015-04-05: 1 via ORAL
  Filled 2015-04-05: qty 1

## 2015-04-05 MED ORDER — ACETAMINOPHEN 325 MG PO TABS: 650.0000 mg | ORAL_TABLET | ORAL | Status: DC | PRN

## 2015-04-05 MED ORDER — WITCH HAZEL-GLYCERIN EX PADS: 1.0000 "application " | MEDICATED_PAD | CUTANEOUS | Status: DC | PRN

## 2015-04-05 MED ORDER — LANOLIN HYDROUS EX OINT: TOPICAL_OINTMENT | CUTANEOUS | Status: DC | PRN

## 2015-04-05 MED ORDER — ZOLPIDEM TARTRATE 5 MG PO TABS: 5.0000 mg | ORAL_TABLET | Freq: Every evening | ORAL | Status: DC | PRN

## 2015-04-05 MED ORDER — BENZOCAINE-MENTHOL 20-0.5 % EX AERO
1.0000 "application " | INHALATION_SPRAY | CUTANEOUS | Status: DC | PRN
  Administered 2015-04-05: 1 via TOPICAL
  Filled 2015-04-05 (×2): qty 56

## 2015-04-05 MED ORDER — SENNOSIDES-DOCUSATE SODIUM 8.6-50 MG PO TABS
2.0000 | ORAL_TABLET | ORAL | Status: DC
  Administered 2015-04-05: 2 via ORAL
  Filled 2015-04-05: qty 2

## 2015-04-05 MED ORDER — PRENATAL MULTIVITAMIN CH
1.0000 | ORAL_TABLET | Freq: Every day | ORAL | Status: DC
  Administered 2015-04-05 – 2015-04-06 (×2): 1 via ORAL
  Filled 2015-04-05 (×2): qty 1

## 2015-04-05 MED ORDER — TETANUS-DIPHTH-ACELL PERTUSSIS 5-2.5-18.5 LF-MCG/0.5 IM SUSP
0.5000 mL | Freq: Once | INTRAMUSCULAR | Status: DC
  Filled 2015-04-05: qty 0.5

## 2015-04-05 MED ORDER — ONDANSETRON HCL 4 MG PO TABS: 4.0000 mg | ORAL_TABLET | ORAL | Status: DC | PRN

## 2015-04-05 NOTE — Lactation Note (Signed)
This note was copied from the chart of [place]. Lactation Consultation Note  Patient Name: Teresa Bright Date: 04/05/2015 Reason for consult: Initial assessment   Initial consult at 75 hours old.  GA 39.2; BW 7 lbs, 1.9 oz.  Moderate MSF.  Mom is a P1.   Infant has breastfed x1 (23 min) + attempts x5 (0-6 min); voids-2; stools-3; LS-5 by RN. Dad holding infant upon entering room.  Offered assistance with latching and mom consented.   Taught mom how to sandwich breast and use asymmetrical latching technique.  Taught dad how to assist at breast with teacup hold.   Infant latched, after unwrapping, to left breast football hold.  Several small sucks; few swallows and then infant went to sleep after 10 minutes.  Lots stimulation needed to keep him sucking.  When infant came off breast parents were able to re-latch him back onto left breast using techniques taught independently.  LS-7 Educated on size of infant's stomach, cluster feeding, and feeding cues.  Encouraged putting baby STS and hand expressing if it has been 3 hours since last feeding.   Mom has hand pump in room.  Taught how to hand express with return demonstration and observation of colostrum appearing on nipple tip.  Mom reports having a medela DEBP at home she received from insurance company she plans to use after discharge. Lactation brochure given and informed of hospital support group and outpatient services. Encouraged to call for assistance as needed during the night.     Maternal Data Has patient been taught Hand Expression?: Yes (with return demonstration and tiny drop colostrum appearing on nipple tip) Does the patient have breastfeeding experience prior to this delivery?: No  Feeding Feeding Type: Breast Fed  LATCH Score/Interventions Latch: Repeated attempts needed to sustain latch, nipple held in mouth throughout feeding, stimulation needed to elicit sucking reflex. Intervention(s): Breast  compression;Assist with latch;Breast massage  Audible Swallowing: A few with stimulation Intervention(s): Hand expression  Type of Nipple: Everted at rest and after stimulation (semi flat but everts well with stimulation) Intervention(s): Shells  Comfort (Breast/Nipple): Soft / non-tender     Hold (Positioning): Assistance needed to correctly position infant at breast and maintain latch. Intervention(s): Skin to skin;Support Pillows;Breastfeeding basics reviewed  LATCH Score: 7  Lactation Tools Discussed/Used WIC Program: No   Consult Status Consult Status: Follow-up Date: 04/06/15 Follow-up type: In-patient    Merlene Laughter 04/05/2015, 8:34 PM

## 2015-04-05 NOTE — Progress Notes (Signed)
Post Partum Day #1 Subjective: no complaints, up ad lib and tolerating PO; breastfeeding going well; plans Nexplanon for contraception  Objective: Blood pressure 115/69, pulse 94, temperature 98.7 F (37.1 C), temperature source Oral, resp. rate 20, height 5\' 4"  (1.626 m), weight 92.987 kg (205 lb), last menstrual period 07/03/2014, SpO2 96 %, unknown if currently breastfeeding.  Physical Exam:  General: alert, cooperative and no distress  Heart: RRR Lungs: nl effort Lochia: appropriate Uterine Fundus: firm DVT Evaluation: No evidence of DVT seen on physical exam.   Recent Labs  04/04/15 1550  HGB 14.0  HCT 41.0    Assessment/Plan: Plan for discharge tomorrow   LOS: 1 day   Aztlan Coll, [place] 04/05/2015, 7:03 AM

## 2015-04-05 NOTE — Anesthesia Postprocedure Evaluation (Signed)
Anesthesia Post Note  Patient: [name]  Procedure(s) Performed: * No procedures listed *  Anesthesia type: Epidural  Patient location: Mother/Baby  Post pain: Pain level controlled  Post assessment: Post-op Vital signs reviewed  Last Vitals:  Filed Vitals:   04/05/15 0600  BP: 115/69  Pulse: 94  Temp: 37.1 C  Resp: 20    Post vital signs: Reviewed  Level of consciousness:alert  Complications: No apparent anesthesia complications

## 2015-04-06 MED ORDER — IBUPROFEN 600 MG PO TABS: 600.0000 mg | ORAL_TABLET | Freq: Four times a day (QID) | ORAL | Status: DC | PRN

## 2015-04-06 NOTE — Discharge Instructions (Signed)
NO SEX UNTIL AFTER YOU GET YOUR BIRTH CONTROL  Postpartum Care After Vaginal Delivery After you deliver your newborn (postpartum period), the usual stay in the hospital is 24-72 hours. If there were problems with your labor or delivery, or if you have other medical problems, you might be in the hospital longer.  While you are in the hospital, you will receive help and instructions on how to care for yourself and your newborn during the postpartum period.  While you are in the hospital:  Be sure to tell your nurses if you have pain or discomfort, as well as where you feel the pain and what makes the pain worse.  If you had an incision made near your vagina (episiotomy) or if you had some tearing during delivery, the nurses may put ice packs on your episiotomy or tear. The ice packs may help to reduce the pain and swelling.  If you are breastfeeding, you may feel uncomfortable contractions of your uterus for a couple of weeks. This is normal. The contractions help your uterus get back to normal size.  It is normal to have some bleeding after delivery.  For the first 1-3 days after delivery, the flow is red and the amount may be similar to a period.  It is common for the flow to start and stop.  In the first few days, you may pass some small clots. Let your nurses know if you begin to pass large clots or your flow increases.  Do not  flush blood clots down the toilet before having the nurse look at them.  During the next 3-10 days after delivery, your flow should become more watery and pink or brown-tinged in color.  Ten to fourteen days after delivery, your flow should be a small amount of yellowish-white discharge.  The amount of your flow will decrease over the first few weeks after delivery. Your flow may stop in 6-8 weeks. Most women have had their flow stop by 12 weeks after delivery.  You should change your sanitary pads frequently.  Wash your hands thoroughly with soap and water  for at least 20 seconds after changing pads, using the toilet, or before holding or feeding your newborn.  You should feel like you need to empty your bladder within the first 6-8 hours after delivery.  In case you become weak, lightheaded, or faint, call your nurse before you get out of bed for the first time and before you take a shower for the first time.  Within the first few days after delivery, your breasts may begin to feel tender and full. This is called engorgement. Breast tenderness usually goes away within 48-72 hours after engorgement occurs. You may also notice milk leaking from your breasts. If you are not breastfeeding, do not stimulate your breasts. Breast stimulation can make your breasts produce more milk.  Spending as much time as possible with your newborn is very important. During this time, you and your newborn can feel close and get to know each other. Having your newborn stay in your room (rooming in) will help to strengthen the bond with your newborn. It will give you time to get to know your newborn and become comfortable caring for your newborn.  Your hormones change after delivery. Sometimes the hormone changes can temporarily cause you to feel sad or tearful. These feelings should not last more than a few days. If these feelings last longer than that, you should talk to your caregiver.  If desired, talk  to your caregiver about methods of family planning or contraception.  Talk to your caregiver about immunizations. Your caregiver may want you to have the following immunizations before leaving the hospital:  Tetanus, diphtheria, and pertussis (Tdap) or tetanus and diphtheria (Td) immunization. It is very important that you and your family (including grandparents) or others caring for your newborn are up-to-date with the Tdap or Td immunizations. The Tdap or Td immunization can help protect your newborn from getting ill.  Rubella immunization.  Varicella (chickenpox)  immunization.  Influenza immunization. You should receive this annual immunization if you did not receive the immunization during your pregnancy.   This information is not intended to replace advice given to you by your health care provider. Make sure you discuss any questions you have with your health care provider.   Document Released: 04/04/2007 Document Revised: 03/01/2012 Document Reviewed: 02/02/2012 Elsevier Interactive Patient Education [employer].  Breastfeeding Deciding to breastfeed is one of the best choices you can make for you and your baby. A change in hormones during pregnancy causes your breast tissue to grow and increases the number and size of your milk ducts. These hormones also allow proteins, sugars, and fats from your blood supply to make breast milk in your milk-producing glands. Hormones prevent breast milk from being released before your baby is born as well as prompt milk flow after birth. Once breastfeeding has begun, thoughts of your baby, as well as his or her sucking or crying, can stimulate the release of milk from your milk-producing glands.  BENEFITS OF BREASTFEEDING For Your Baby  Your first milk (colostrum) helps your baby's digestive system function better.  There are antibodies in your milk that help your baby fight off infections.  Your baby has a lower incidence of asthma, allergies, and sudden infant death syndrome.  The nutrients in breast milk are better for your baby than infant formulas and are designed uniquely for your baby's needs.  Breast milk improves your baby's brain development.  Your baby is less likely to develop other conditions, such as childhood obesity, asthma, or type 2 diabetes mellitus. For You  Breastfeeding helps to create a very special bond between you and your baby.  Breastfeeding is convenient. Breast milk is always available at the correct temperature and costs nothing.  Breastfeeding helps to burn calories  and helps you lose the weight gained during pregnancy.  Breastfeeding makes your uterus contract to its prepregnancy size faster and slows bleeding (lochia) after you give birth.   Breastfeeding helps to lower your risk of developing type 2 diabetes mellitus, osteoporosis, and breast or ovarian cancer later in life. SIGNS THAT YOUR BABY IS HUNGRY Early Signs of Hunger  Increased alertness or activity.  Stretching.  Movement of the head from side to side.  Movement of the head and opening of the mouth when the corner of the mouth or cheek is stroked (rooting).  Increased sucking sounds, smacking lips, cooing, sighing, or squeaking.  Hand-to-mouth movements.  Increased sucking of fingers or hands. Late Signs of Hunger  Fussing.  Intermittent crying. Extreme Signs of Hunger Signs of extreme hunger will require calming and consoling before your baby will be able to breastfeed successfully. Do not wait for the following signs of extreme hunger to occur before you initiate breastfeeding:  Restlessness.  A loud, strong cry.  Screaming. BREASTFEEDING BASICS Breastfeeding Initiation  Find a comfortable place to sit or lie down, with your neck and back well supported.  Place a  pillow or rolled up blanket under your baby to bring him or her to the level of your breast (if you are seated). Nursing pillows are specially designed to help support your arms and your baby while you breastfeed.  Make sure that your baby's abdomen is facing your abdomen.  Gently massage your breast. With your fingertips, massage from your chest wall toward your nipple in a circular motion. This encourages milk flow. You may need to continue this action during the feeding if your milk flows slowly.  Support your breast with 4 fingers underneath and your thumb above your nipple. Make sure your fingers are well away from your nipple and your baby's mouth.  Stroke your baby's lips gently with your finger or  nipple.  When your baby's mouth is open wide enough, quickly bring your baby to your breast, placing your entire nipple and as much of the colored area around your nipple (areola) as possible into your baby's mouth.  More areola should be visible above your baby's upper lip than below the lower lip.  Your baby's tongue should be between his or her lower gum and your breast.  Ensure that your baby's mouth is correctly positioned around your nipple (latched). Your baby's lips should create a seal on your breast and be turned out (everted).  It is common for your baby to suck about 2-3 minutes in order to start the flow of breast milk. Latching Teaching your baby how to latch on to your breast properly is very important. An improper latch can cause nipple pain and decreased milk supply for you and poor weight gain in your baby. Also, if your baby is not latched onto your nipple properly, he or she may swallow some air during feeding. This can make your baby fussy. Burping your baby when you switch breasts during the feeding can help to get rid of the air. However, teaching your baby to latch on properly is still the best way to prevent fussiness from swallowing air while breastfeeding. Signs that your baby has successfully latched on to your nipple:  Silent tugging or silent sucking, without causing you pain.  Swallowing heard between every 3-4 sucks.  Muscle movement above and in front of his or her ears while sucking. Signs that your baby has not successfully latched on to nipple:  Sucking sounds or smacking sounds from your baby while breastfeeding.  Nipple pain. If you think your baby has not latched on correctly, slip your finger into the corner of your baby's mouth to break the suction and place it between your baby's gums. Attempt breastfeeding initiation again. Signs of Successful Breastfeeding Signs from your baby:  A gradual decrease in the number of sucks or complete cessation of  sucking.  Falling asleep.  Relaxation of his or her body.  Retention of a small amount of milk in his or her mouth.  Letting go of your breast by himself or herself. Signs from you:  Breasts that have increased in firmness, weight, and size 1-3 hours after feeding.  Breasts that are softer immediately after breastfeeding.  Increased milk volume, as well as a change in milk consistency and color by the fifth day of breastfeeding.  Nipples that are not sore, cracked, or bleeding. Signs That Your [employer] Books is Getting Enough Milk  Wetting at least 3 diapers in a 24-hour period. The urine should be clear and pale yellow by age 37 days.  At least 3 stools in a 24-hour period by age 35  days. The stool should be soft and yellow.  At least 3 stools in a 24-hour period by age 54 days. The stool should be seedy and yellow.  No loss of weight greater than 10% of birth weight during the first 63 days of age.  Average weight gain of 4-7 ounces (113-198 g) per week after age 43 days.  Consistent daily weight gain by age 57 days, without weight loss after the age of 2 weeks. After a feeding, your baby may spit up a small amount. This is common. BREASTFEEDING FREQUENCY AND DURATION Frequent feeding will help you make more milk and can prevent sore nipples and breast engorgement. Breastfeed when you feel the need to reduce the fullness of your breasts or when your baby shows signs of hunger. This is called "breastfeeding on demand." Avoid introducing a pacifier to your baby while you are working to establish breastfeeding (the first 4-6 weeks after your baby is born). After this time you may choose to use a pacifier. Research has shown that pacifier use during the first year of a baby's life decreases the risk of sudden infant death syndrome (SIDS). Allow your baby to feed on each breast as long as he or she wants. Breastfeed until your baby is finished feeding. When your baby unlatches or falls asleep while  feeding from the first breast, offer the second breast. Because newborns are often sleepy in the first few weeks of life, you may need to awaken your baby to get him or her to feed. Breastfeeding times will vary from baby to baby. However, the following rules can serve as a guide to help you ensure that your baby is properly fed:  Newborns (babies 73 weeks of age or younger) may breastfeed every 1-3 hours.  Newborns should not go longer than 3 hours during the day or 5 hours during the night without breastfeeding.  You should breastfeed your baby a minimum of 8 times in a 24-hour period until you begin to introduce solid foods to your baby at around 53 months of age. BREAST MILK PUMPING Pumping and storing breast milk allows you to ensure that your baby is exclusively fed your breast milk, even at times when you are unable to breastfeed. This is especially important if you are going back to work while you are still breastfeeding or when you are not able to be present during feedings. Your lactation consultant can give you guidelines on how long it is safe to store breast milk. A breast pump is a machine that allows you to pump milk from your breast into a sterile bottle. The pumped breast milk can then be stored in a refrigerator or freezer. Some breast pumps are operated by hand, while others use electricity. Ask your lactation consultant which type will work best for you. Breast pumps can be purchased, but some hospitals and breastfeeding support groups lease breast pumps on a monthly basis. A lactation consultant can teach you how to hand express breast milk, if you prefer not to use a pump. CARING FOR YOUR BREASTS WHILE YOU BREASTFEED Nipples can become dry, cracked, and sore while breastfeeding. The following recommendations can help keep your breasts moisturized and healthy:  Avoid using soap on your nipples.  Wear a supportive bra. Although not required, special nursing bras and tank tops are  designed to allow access to your breasts for breastfeeding without taking off your entire bra or top. Avoid wearing underwire-style bras or extremely tight bras.  Air dry your  nipples for 3-79minutes after each feeding.  Use only cotton bra pads to absorb leaked breast milk. Leaking of breast milk between feedings is normal.  Use lanolin on your nipples after breastfeeding. Lanolin helps to maintain your skin's normal moisture barrier. If you use pure lanolin, you do not need to wash it off before feeding your baby again. Pure lanolin is not toxic to your baby. You may also hand express a few drops of breast milk and gently massage that milk into your nipples and allow the milk to air dry. In the first few weeks after giving birth, some women experience extremely full breasts (engorgement). Engorgement can make your breasts feel heavy, warm, and tender to the touch. Engorgement peaks within 3-5 days after you give birth. The following recommendations can help ease engorgement:  Completely empty your breasts while breastfeeding or pumping. You may want to start by applying warm, moist heat (in the shower or with warm water-soaked hand towels) just before feeding or pumping. This increases circulation and helps the milk flow. If your baby does not completely empty your breasts while breastfeeding, pump any extra milk after he or she is finished.  Wear a snug bra (nursing or regular) or tank top for 1-2 days to signal your body to slightly decrease milk production.  Apply ice packs to your breasts, unless this is too uncomfortable for you.  Make sure that your baby is latched on and positioned properly while breastfeeding. If engorgement persists after 48 hours of following these recommendations, contact your health care provider or a Science writer. OVERALL HEALTH CARE RECOMMENDATIONS WHILE BREASTFEEDING  Eat healthy foods. Alternate between meals and snacks, eating 3 of each per day. Because  what you eat affects your breast milk, some of the foods may make your baby more irritable than usual. Avoid eating these foods if you are sure that they are negatively affecting your baby.  Drink milk, fruit juice, and water to satisfy your thirst (about 10 glasses a day).  Rest often, relax, and continue to take your prenatal vitamins to prevent fatigue, stress, and anemia.  Continue breast self-awareness checks.  Avoid chewing and smoking tobacco. Chemicals from cigarettes that pass into breast milk and exposure to secondhand smoke may harm your baby.  Avoid alcohol and drug use, including marijuana. Some medicines that may be harmful to your baby can pass through breast milk. It is important to ask your health care provider before taking any medicine, including all over-the-counter and prescription medicine as well as vitamin and herbal supplements. It is possible to become pregnant while breastfeeding. If birth control is desired, ask your health care provider about options that will be safe for your baby. SEEK MEDICAL CARE IF:  You feel like you want to stop breastfeeding or have become frustrated with breastfeeding.  You have painful breasts or nipples.  Your nipples are cracked or bleeding.  Your breasts are red, tender, or warm.  You have a swollen area on either breast.  You have a fever or chills.  You have nausea or vomiting.  You have drainage other than breast milk from your nipples.  Your breasts do not become full before feedings by the fifth day after you give birth.  You feel sad and depressed.  Your baby is too sleepy to eat well.  Your baby is having trouble sleeping.   Your baby is wetting less than 3 diapers in a 24-hour period.  Your baby has less than 3 stools in a  24-hour period.  Your baby's skin or the white part of his or her eyes becomes yellow.   Your baby is not gaining weight by 57 days of age. SEEK IMMEDIATE MEDICAL CARE IF:  Your baby  is overly tired (lethargic) and does not want to wake up and feed.  Your baby develops an unexplained fever.   This information is not intended to replace advice given to you by your health care provider. Make sure you discuss any questions you have with your health care provider.   Document Released: 06/07/2005 Document Revised: 02/26/2015 Document Reviewed: 11/29/2012 Elsevier Interactive Patient Education [employer].

## 2015-04-06 NOTE — Lactation Note (Signed)
This note was copied from the chart of [place]. Lactation Consultation Note  Patient Name: Boy Priti Consoli [id number] Date: 04/06/2015  baby is 63 hour old , using a Nipple shield for latching #20 , Resized - #20 NS fits the best, #24 NS to large.  LC instructed mom and dad how to apply the NS , and to use the curved tip syringe to instill EBM or formula  Into the top of the NIpple shield to give the baby an appetizer. LC suggested continuing appetizer until the mil came in  And breast milk was more free flowing. Also recommended feeding the baby on the 1st breast 15 -20 mins , and supplementing  Afterwards 20 -30 ml. Post pump both breast for for 10 -15 mins to enhance volume , save milk, supplement back to baby. Sore nipple and engorgement prevention and tx reviewed. Per mom has a DEBP at home [facility] ) , and a hand pump from the hospital.  LC stressed to mom and dad in the event the baby won't latch with the NS , to feed the baby with a medium slow flow nipple.  Feed the baby at least at 3 hours. ( Goal is at least 8 times a day or greater )  LC offered mom and dad an [place] O/P apt . Mom concerned if insurance will pay for it. Mom preferred to call insurance company Monday and  Call [facility] office back to schedule apt .  LC also instructed mom on the use shells .  Baby awake for a short interval at consult - [place] assisted with latch , right breast , football position, and latched for a few strong sucks and released.  Areola semi compressible , and needed to be compressed until the baby started sucking . Lc reviewed basics - hand expressed small drops. Dad took notes for the Lactation Plan and both mom and dad receptive to teaching. Mother informed of post-discharge support and given phone number to the lactation department, including services for phone call assistance; out-patient appointments; and breastfeeding support group. List of other breastfeeding resources in the community given in  the handout. Encouraged mother to call for problems or concerns related to breastfeeding.    Maternal Data    Feeding  [facility] Score/Interventions                      Lactation Tools Discussed/Used     Consult Status      Myer Haff 04/06/2015, 1:22 PM

## 2015-04-06 NOTE — Discharge Summary (Signed)
OB Discharge Summary     Patient Name: Teresa Bright DOB: [birth year] MRN: [id number]  Date of admission: 04/04/2015 Delivering MD: Serita Grammes D   Date of discharge: 04/06/2015  Admitting diagnosis: 39WKS,LABOR Intrauterine pregnancy: [redacted]w[redacted]d     Secondary diagnosis: None     Discharge diagnosis: Term Pregnancy Delivered                                                                                                Post partum procedures:none  Augmentation: AROM  Complications: None  Hospital course:  Onset of Labor With Vaginal Delivery     29 y.o. yo G1P1001 at [redacted]w[redacted]d was admitted in Active Laboron 04/04/2015. Patient had an uncomplicated labor course as follows:  Membrane Rupture Time/Date: 5:57 PM ,04/04/2015   Intrapartum Procedures: Episiotomy: None [1]                                         Lacerations:  2nd degree [3];Vaginal [6]  Patient had a delivery of a Viable infant. 04/04/2015  Information for the patient's newborn:  Kyran, [place] [[id number]]  Delivery Method: Vaginal, Spontaneous Delivery (Filed from Delivery Summary)    Pateint had an uncomplicated postpartum course.  She is ambulating, tolerating a regular diet, passing flatus, and urinating well. Patient is discharged home in stable condition on No discharge date for patient encounter.Marland Kitchen    Physical exam  Filed Vitals:   04/05/15 0950 04/05/15 1500 04/05/15 1846 04/06/15 0550  BP: 115/64 112/78 113/63 112/50  Pulse: 88 92 91 73  Temp: 98.1 F (36.7 C) 98.8 F (37.1 C) 98.4 F (36.9 C) 98 F (36.7 C)  TempSrc: Oral Oral Oral Oral  Resp: 18 18 18 16   Height:      Weight:      SpO2: 98% 98%     General: alert, cooperative and no distress Lochia: appropriate Uterine Fundus: firm Incision: N/A DVT Evaluation: No evidence of DVT seen on physical exam. Negative Homan's sign. No cords or calf tenderness. No significant calf/ankle edema. Labs: Lab Results  Component Value Date   WBC  13.9* 04/04/2015   HGB 14.0 04/04/2015   HCT 41.0 04/04/2015   MCV 88.0 04/04/2015   PLT 246 04/04/2015   No flowsheet data found.  Discharge instruction: per After Visit Summary and "Baby and Me Booklet".  Medications:  Current facility-administered medications:  .  acetaminophen (TYLENOL) tablet 650 mg, 650 mg, Oral, Q4H PRN, Myrtis Ser, CNM .  benzocaine-Menthol (DERMOPLAST) 20-0.5 % topical spray 1 application, 1 application, Topical, PRN, Myrtis Ser, CNM, 1 application at 37/10/62 1005 .  witch hazel-glycerin (TUCKS) pad 1 application, 1 application, Topical, PRN **AND** dibucaine (NUPERCAINAL) 1 % rectal ointment 1 application, 1 application, Rectal, PRN, Myrtis Ser, CNM .  diphenhydrAMINE (BENADRYL) capsule 25 mg, 25 mg, Oral, Q6H PRN, Myrtis Ser, CNM .  ibuprofen (ADVIL,MOTRIN) tablet 600 mg, 600 mg, Oral, 4 times per day, Myrtis Ser, CNM, 600 mg at 04/05/15  2310 .  lanolin ointment, , Topical, PRN, Myrtis Ser, CNM .  ondansetron (ZOFRAN) tablet 4 mg, 4 mg, Oral, Q4H PRN **OR** ondansetron (ZOFRAN) injection 4 mg, 4 mg, Intravenous, Q4H PRN, Myrtis Ser, CNM .  oxyCODONE-acetaminophen (PERCOCET/ROXICET) 5-325 MG per tablet 1 tablet, 1 tablet, Oral, Q4H PRN, Myrtis Ser, CNM, 1 tablet at 04/05/15 1521 .  oxyCODONE-acetaminophen (PERCOCET/ROXICET) 5-325 MG per tablet 2 tablet, 2 tablet, Oral, Q4H PRN, Myrtis Ser, CNM .  prenatal multivitamin tablet 1 tablet, 1 tablet, Oral, Q1200, Myrtis Ser, CNM, 1 tablet at 04/05/15 1152 .  senna-docusate (Senokot-S) tablet 2 tablet, 2 tablet, Oral, Q24H, Myrtis Ser, CNM, 2 tablet at 04/05/15 2311 .  simethicone (MYLICON) chewable tablet 80 mg, 80 mg, Oral, PRN, Myrtis Ser, CNM .  Tdap (BOOSTRIX) injection 0.5 mL, 0.5 mL, Intramuscular, Once, Myrtis Ser, CNM, 0.5 mL at 04/05/15 1000 .  zolpidem (AMBIEN) tablet 5 mg, 5 mg, Oral, QHS PRN, Myrtis Ser, CNM  Diet: routine  diet  Activity: Advance as tolerated. Pelvic rest for 6 weeks.   Outpatient follow up:6 weeks  Postpartum contraception: Nexplanon, abstinence until  Newborn Data: Live born female  Birth Weight: 7 lb 1.9 oz (3229 g) APGAR: 8, 9  Baby Feeding: Breast Disposition:home with mother  [facility]   04/06/2015 Tawnya Crook, CNM

## 2015-04-09 ENCOUNTER — Encounter: Payer: Commercial Indemnity | Admitting: Advanced Practice Midwife

## 2015-04-11 ENCOUNTER — Ambulatory Visit (HOSPITAL_COMMUNITY)
Admission: RE | Admit: 2015-04-11 | Discharge: 2015-04-11 | Disposition: A | Discharge status: Home / Self Care | Payer: Managed Care, Other (non HMO) | Source: Ambulatory Visit | Attending: Obstetrics and Gynecology | Admitting: Obstetrics and Gynecology

## 2015-04-11 NOTE — Lactation Note (Signed)
Lactation Consult for [place] (DOB: [birth year])  Mother's reason for visit: "help w/latching & tips for BF w/large breasts" Consult:  Initial Lactation Consultant:  Teresa Bright  ________________________________________________________________________ BW: 5397Q (7# 1.9oz) D/c wt: 6# 12.3 oz Wt on 10-18 & 10-20: 6# 15 oz Today's weight: 3182g (7# 0.2oz) (Only 1.5% below BW)  ________________________________________________________________________  Mother's Name: Teresa Bright Type of delivery:  Vag Breastfeeding Experience: primip Maternal Medical Conditions:  None Maternal Medications:  PNV, IB  ________________________________________________________________________  Breastfeeding History (Post Discharge)  Frequency of breastfeeding:8-10 times/day (half of the feeds are good feedings) Duration of feeding:   Supplementation  Formula:  Volume 15-12ml Frequency: prn Total volume per day: variable       Brand: Similac Alimentum  Breastmilk: Volume 30-60 ml Frequency: as available from pumping Total volume per day: variable  Method:  Bottle  Infant Intake and Output Assessment  Voids: 5-6 in 24 hrs.  Color:  Clear yellow Stools: 5-6 in 24 hrs.  Color:  Brown  ________________________________________________________________________  Maternal Breast Assessment  Breast:  Soft and Filling Nipple:  Erect _______________________________________________________________________ Feeding Assessment/Evaluation  Initial feeding assessment:  Infant's oral assessment:  Variance. See below.   Attached assessment:  Deep  Lips flanged:  Yes.    Suck assessment:  Nutritive  Tools:  Nipple shield 20 mm Instructed on use and cleaning of tool:  Yes.    Pre-feed weight: 3182 g  (7 lb. 0.2 oz.) Post-feed weight: 3250 g Amount transferred: 68 ml Amount supplemented: 1 ml or less (to prefill nipple shield) L breast, football, 40  min  Pre-feed weight: 3250g Post-feed weight: 3278 g  Amount transferred: 28 ml Amount supplemented:1 ml or less(to prefill nipple shield) R breast, football  Total amount transferred: 96 ml Total supplement given: 2 ml or less  Teresa Bright is 62 week old and is almost back to BW. Per parents, he does not consistently feed well at the breast. On average, he feeds well at the breast about 50% of the time. When he does not feed well, parents give him a bottle of EBM or formula. Mom uses a size 20 nipple shield for feedings.   Mom is large-breasted and she has had concerns about Teresa Bright being able to breathe during feedings, etc. Her milk has transitioned, but by palpation, her volume is not as much as I had expected. Mom was unsure if her milk had come all the way in.  Teresa Bright was placed at the breast with the nipple shield. Once the nipple shield was prefilled w/a little bit of formula he latched with ease. His lips were flanged & his swallows were readily noticeable. We repeated the same on the other side & he did very well.  Mom was shown how to get an asymmetric latch. He transferred a total of 29mL.    It is probable that the parents have had such a difficult time feeding Teresa Bright b/c they were not consistently prefilling the nipple shield to entice him to latch. Also, after speaking w/Mom, it became apparent that she was handling her breasts in a way that was likely interfering w/latch (e.g. pulling the breast away from his nose out of concern for the size of her breasts).  Mom was reassured and shown ways to deal with latching a baby when large-breasted.   Mom does not have "low milk supply;" I believe that her breasts have been somewhat under-stimulated and she could potentially have a higher volume. Over the past  2 days, Mom has pumped tid.  Prior to that, she had only pumped once/day. Mom's nipples are a small diameter. The size 24 flange she has been using was likely too big.  Mom was provided size 21  flanges. Flange size was checked on R side & it fit her well.   Oral exam: Teresa Bright has a good suck & normal palate. He was noted to have restricted elevation & lateralization. A frenulum is easily visible when he cries & it is easily palpated. There is a possibility that he would not need a nipple shield if there were an increase in tongue mobility.    Parents were pleased w/how well Teresa Bright did at the breast. He finished the feeding on his own.  Parents were given tips on how they could make the feeding at home more like this one.    Plan:  1. Feed him at breast, using nipple shield that has been prefilled w/EBM or breastmilk (extra curved-tip syringes were provided). Mom also provided w/a "contact" nipple shield.  2. Elevate breasts for feeding & "lift" underside of breast during feedings to increase drainage of lower quadrants. 3. Obtain better-fitting bras so she can have good support, etc. 4. If Teresa Bright feeds well, Mom can pump bid.  If Teresa Bright does not feed well, she needs to pump after those feedings. Mom to use size 21 flanges.   Parents given anticipatory guidance about his impending growth spurt.  Parents to return to peds office for a weight check on Tuesday, Oct 25th.  Call if any questions.   Elinor Dodge, RN, IBCLC

## 2015-04-22 ENCOUNTER — Telehealth: Payer: Self-pay | Admitting: *Deleted

## 2015-04-22 DIAGNOSIS — Z029 Encounter for administrative examinations, unspecified: Secondary | ICD-10-CM

## 2015-04-22 NOTE — Telephone Encounter (Signed)
Pt informed FMLA forms completed and ready for pick up will need to pay $29.00 form fee if not paid already. Pt verbalized understanding.

## 2015-05-12 ENCOUNTER — Encounter: Payer: Self-pay | Admitting: Women's Health

## 2015-05-12 ENCOUNTER — Ambulatory Visit (INDEPENDENT_AMBULATORY_CARE_PROVIDER_SITE_OTHER): Payer: Managed Care, Other (non HMO) | Admitting: Women's Health

## 2015-05-12 VITALS — BP 126/64 | HR 64 | Wt 188.0 lb

## 2015-05-12 DIAGNOSIS — N83291 Other ovarian cyst, right side: Secondary | ICD-10-CM | POA: Diagnosis not present

## 2015-05-12 DIAGNOSIS — N83201 Unspecified ovarian cyst, right side: Secondary | ICD-10-CM

## 2015-05-12 NOTE — Patient Instructions (Signed)
NO SEX UNTIL AFTER YOU GET YOUR BIRTH CONTROL   Etonogestrel implant What is this medicine? ETONOGESTREL (et oh noe JES trel) is a contraceptive (birth control) device. It is used to prevent pregnancy. It can be used for up to 3 years. This medicine may be used for other purposes; ask your health care provider or pharmacist if you have questions. What should I tell my health care provider before I take this medicine? They need to know if you have any of these conditions: -abnormal vaginal bleeding -blood vessel disease or blood clots -cancer of the breast, cervix, or liver -depression -diabetes -gallbladder disease -headaches -heart disease or recent heart attack -high blood pressure -high cholesterol -kidney disease -liver disease -renal disease -seizures -tobacco smoker -an unusual or allergic reaction to etonogestrel, other hormones, anesthetics or antiseptics, medicines, foods, dyes, or preservatives -pregnant or trying to get pregnant -breast-feeding How should I use this medicine? This device is inserted just under the skin on the inner side of your upper arm by a health care professional. Talk to your pediatrician regarding the use of this medicine in children. Special care may be needed. Overdosage: If you think you have taken too much of this medicine contact a poison control center or emergency room at once. NOTE: This medicine is only for you. Do not share this medicine with others. What if I miss a dose? This does not apply. What may interact with this medicine? Do not take this medicine with any of the following medications: -amprenavir -bosentan -fosamprenavir This medicine may also interact with the following medications: -barbiturate medicines for inducing sleep or treating seizures -certain medicines for fungal infections like ketoconazole and itraconazole -griseofulvin -medicines to treat seizures like carbamazepine, felbamate, oxcarbazepine, phenytoin,  topiramate -modafinil -phenylbutazone -rifampin -some medicines to treat HIV infection like atazanavir, indinavir, lopinavir, nelfinavir, tipranavir, ritonavir -St. John's wort This list may not describe all possible interactions. Give your health care provider a list of all the medicines, herbs, non-prescription drugs, or dietary supplements you use. Also tell them if you smoke, drink alcohol, or use illegal drugs. Some items may interact with your medicine. What should I watch for while using this medicine? This product does not protect you against HIV infection (AIDS) or other sexually transmitted diseases. You should be able to feel the implant by pressing your fingertips over the skin where it was inserted. Contact your doctor if you cannot feel the implant, and use a non-hormonal birth control method (such as condoms) until your doctor confirms that the implant is in place. If you feel that the implant may have broken or become bent while in your arm, contact your healthcare provider. What side effects may I notice from receiving this medicine? Side effects that you should report to your doctor or health care professional as soon as possible: -allergic reactions like skin rash, itching or hives, swelling of the face, lips, or tongue -breast lumps -changes in emotions or moods -depressed mood -heavy or prolonged menstrual bleeding -pain, irritation, swelling, or bruising at the insertion site -scar at site of insertion -signs of infection at the insertion site such as fever, and skin redness, pain or discharge -signs of pregnancy -signs and symptoms of a blood clot such as breathing problems; changes in vision; chest pain; severe, sudden headache; pain, swelling, warmth in the leg; trouble speaking; sudden numbness or weakness of the face, arm or leg -signs and symptoms of liver injury like dark yellow or brown urine; general ill feeling or flu-like  symptoms; light-colored stools; loss of  appetite; nausea; right upper belly pain; unusually weak or tired; yellowing of the eyes or skin -unusual vaginal bleeding, discharge -signs and symptoms of a stroke like changes in vision; confusion; trouble speaking or understanding; severe headaches; sudden numbness or weakness of the face, arm or leg; trouble walking; dizziness; loss of balance or coordination Side effects that usually do not require medical attention (Report these to your doctor or health care professional if they continue or are bothersome.): -acne -back pain -breast pain -changes in weight -dizziness -general ill feeling or flu-like symptoms -headache -irregular menstrual bleeding -nausea -sore throat -vaginal irritation or inflammation This list may not describe all possible side effects. Call your doctor for medical advice about side effects. You may report side effects to FDA at 1-800-FDA-1088. Where should I keep my medicine? This drug is given in a hospital or clinic and will not be stored at home. NOTE: This sheet is a summary. It may not cover all possible information. If you have questions about this medicine, talk to your doctor, pharmacist, or health care provider.    2016, Elsevier/Gold Standard. (2014-03-22 14:07:06)

## 2015-05-12 NOTE — Progress Notes (Signed)
Patient ID: Teresa Bright, female   DOB: 01-23-1986, 29 y.o.   MRN: [id number] Subjective:    Teresa Bright is a 29 y.o. G44P1001 Caucasian female who presents for a postpartum visit. She is 5 weeks postpartum following a spontaneous vaginal delivery at 39.2 gestational weeks. Anesthesia: epidural. I have fully reviewed the prenatal and intrapartum course. Postpartum course has been uncomplicated. Baby's course has been uncomplicated. Baby is feeding by breast x 2wks, now bottle. Bleeding no bleeding. Bowel function is normal. Bladder function is normal. Patient is sexually active. Last sexual activity: 11/19. Contraception method is coitus interruptus and wants nexplanon. Postpartum depression screening: negative. Score 8.  Last pap 2014 and was neg in [place]. Large 9.7cm Rt simple ovarian cyst during pregnancy, asymptomatic  The following portions of the patient's history were reviewed and updated as appropriate: allergies, current medications, past medical history, past surgical history and problem list.  Review of Systems Pertinent items are noted in HPI.   Filed Vitals:   05/12/15 1017  BP: 126/64  Pulse: 64  Weight: 188 lb (85.276 kg)   No LMP recorded.  Objective:   General:  alert, cooperative and no distress   Breasts:  deferred, no complaints  Lungs: clear to auscultation bilaterally  Heart:  regular rate and rhythm  Abdomen: soft, nontender   Vulva: normal  Vagina: normal vagina  Cervix:  closed  Corpus: Well-involuted  Adnexa:  Non-palpable  Rectal Exam: No hemorrhoids        Assessment:   Postpartum exam 5 wks s/p SVB Bottlefeeding Depression screening Contraception counseling  Rt simple ovarian cyst during pregnancy  Plan:   Contraception: abstinence until nexplanon placed Follow up in: 11/29 for bhcg am, then pelvic US to recheck ovarian cyst and f/u US and nexplanon insertion w/ me pm (verified w/ Teresa Bright all can be done in same visit)  Tawnya Crook CNM, WHNP-BC 05/12/2015 10:30 AM

## 2015-05-20 ENCOUNTER — Ambulatory Visit (INDEPENDENT_AMBULATORY_CARE_PROVIDER_SITE_OTHER): Payer: Managed Care, Other (non HMO) | Admitting: Women's Health

## 2015-05-20 ENCOUNTER — Other Ambulatory Visit: Payer: Managed Care, Other (non HMO)

## 2015-05-20 ENCOUNTER — Encounter: Payer: Self-pay | Admitting: Women's Health

## 2015-05-20 ENCOUNTER — Other Ambulatory Visit: Payer: Self-pay | Admitting: Women's Health

## 2015-05-20 ENCOUNTER — Ambulatory Visit (INDEPENDENT_AMBULATORY_CARE_PROVIDER_SITE_OTHER): Payer: Managed Care, Other (non HMO)

## 2015-05-20 VITALS — BP 122/74 | HR 84 | Wt 189.0 lb

## 2015-05-20 DIAGNOSIS — Z30017 Encounter for initial prescription of implantable subdermal contraceptive: Secondary | ICD-10-CM

## 2015-05-20 DIAGNOSIS — Z3040 Encounter for surveillance of contraceptives, unspecified: Secondary | ICD-10-CM

## 2015-05-20 DIAGNOSIS — N83201 Unspecified ovarian cyst, right side: Secondary | ICD-10-CM

## 2015-05-20 LAB — BETA HCG QUANT (REF LAB)

## 2015-05-20 NOTE — Progress Notes (Signed)
PELVIC US TA/TV: homogenous anteverted uterus,normal lt ov,simple cyst rt ov 6.9 x 5.5 x 5cm w/arterial and venous flow seen,EEC 15.19mm

## 2015-05-20 NOTE — Progress Notes (Addendum)
Patient ID: Teresa Bright, female   DOB: 1985-09-08, 29 y.o.   MRN: [id number]  Teresa Bright is a 29 y.o. year old Caucasian female here for Nexplanon insertion. She is 6wks s/p SVB.  No LMP recorded., last sexual intercourse was 11/19, and her pregnancy test today was negative.  Risks/benefits/side effects of Nexplanon have been discussed and her questions have been answered.  Specifically, a failure rate of 06/998 has been reported, with an increased failure rate if pt takes [place] and/or antiseizure medicaitons.  Teresa Bright is aware of the common side effect of irregular bleeding, which the incidence of decreases over time. She also had a Rt simple cyst during pregnancy: 11x7cm> down to 9.7x7.3x9.4cm (July), repeated u/s today and is down to 6.9x5.5x5cm, asymptomatic, w/ good doppler flow  BP 122/74 mmHg  Pulse 84  Wt 189 lb (85.73 kg)  Breastfeeding? No  Results for orders placed or performed in visit on 05/20/15 (from the past 24 hour(s))  Beta HCG, Quant (tumor marker)   Collection Time: 05/20/15  9:14 AM  Result Value Ref Range   hCG Quant <1 mIU/mL     She is right-handed, so her left arm, approximately 4 inches proximal from the elbow, was cleansed with alcohol and anesthetized with 2cc of 2% Lidocaine.  The area was cleansed again with betadine and the Nexplanon was inserted per manufacturer's recommendations without difficulty.  A steri-strip and pressure bandage were applied.  Pt was instructed to keep the area clean and dry, remove pressure bandage in 24 hours, and keep insertion site covered with the steri-strip for 3-5 days.  Back up contraception was recommended for 2 weeks.  She was given a card indicating date Nexplanon was inserted and date it needs to be removed. Follow-up 9mths for pap & physical and f/u u/s ovarian cyst- discussed w/ [facility] who agrees w/ poc. Reviewed torsion s/s.    Tawnya Crook CNM, [facility] 05/20/2015 2:06 PM

## 2015-05-20 NOTE — Addendum Note (Signed)
Addended by: Roma Schanz on: 05/20/2015 02:56 PM   Modules accepted: Orders

## 2015-05-20 NOTE — Patient Instructions (Signed)

## 2015-08-12 ENCOUNTER — Encounter: Payer: Self-pay | Admitting: Women's Health

## 2015-08-12 ENCOUNTER — Ambulatory Visit (INDEPENDENT_AMBULATORY_CARE_PROVIDER_SITE_OTHER): Payer: Managed Care, Other (non HMO) | Admitting: Women's Health

## 2015-08-12 DIAGNOSIS — Z3202 Encounter for pregnancy test, result negative: Secondary | ICD-10-CM | POA: Diagnosis not present

## 2015-08-12 DIAGNOSIS — Z3046 Encounter for surveillance of implantable subdermal contraceptive: Secondary | ICD-10-CM

## 2015-08-12 LAB — POCT URINE PREGNANCY: PREG TEST UR: NEGATIVE

## 2015-08-12 MED ORDER — NORETHIN-ETH ESTRAD-FE BIPHAS 1 MG-10 MCG / 10 MCG PO TABS: 1.0000 | ORAL_TABLET | Freq: Every day | ORAL | Status: DC

## 2015-08-12 NOTE — Addendum Note (Signed)
Addended by: Traci Sermon A on: 08/12/2015 03:47 PM   Modules accepted: Orders

## 2015-08-12 NOTE — Patient Instructions (Signed)
Oral Contraception Use Oral contraceptive pills (OCPs) are medicines taken to prevent pregnancy. OCPs work by preventing the ovaries from releasing eggs. The hormones in OCPs also cause the cervical mucus to thicken, preventing the sperm from entering the uterus. The hormones also cause the uterine lining to become thin, not allowing a fertilized egg to attach to the inside of the uterus. OCPs are highly effective when taken exactly as prescribed. However, OCPs do not prevent sexually transmitted diseases (STDs). Safe sex practices, such as using condoms along with an OCP, can help prevent STDs. Before taking OCPs, you may have a physical exam and Pap test. Your health care provider may also order blood tests if necessary. Your health care provider will make sure you are a good candidate for oral contraception. Discuss with your health care provider the possible side effects of the OCP you may be prescribed. When starting an OCP, it can take 2 to 3 months for the body to adjust to the changes in hormone levels in your body.  HOW TO TAKE ORAL CONTRACEPTIVE PILLS Your health care provider may advise you on how to start taking the first cycle of OCPs. Otherwise, you can:   Start on day 1 of your menstrual period. You will not need any backup contraceptive protection with this start time.   Start on the first Sunday after your menstrual period or the day you get your prescription. In these cases, you will need to use backup contraceptive protection for the first week.   Start the pill at any time of your cycle. If you take the pill within 5 days of the start of your period, you are protected against pregnancy right away. In this case, you will not need a backup form of birth control. If you start at any other time of your menstrual cycle, you will need to use another form of birth control for 7 days. If your OCP is the type called a minipill, it will protect you from pregnancy after taking it for 2 days (48  hours). After you have started taking OCPs:   If you forget to take 1 pill, take it as soon as you remember. Take the next pill at the regular time.   If you miss 2 or more pills, call your health care provider because different pills have different instructions for missed doses. Use backup birth control until your next menstrual period starts.   If you use a 28-day pack that contains inactive pills and you miss 1 of the last 7 pills (pills with no hormones), it will not matter. Throw away the rest of the non-hormone pills and start a new pill pack.  No matter which day you start the OCP, you will always start a new pack on that same day of the week. Have an extra pack of OCPs and a backup contraceptive method available in case you miss some pills or lose your OCP pack.  HOME CARE INSTRUCTIONS   Do not smoke.   Always use a condom to protect against STDs. OCPs do not protect against STDs.   Use a calendar to mark your menstrual period days.   Read the information and directions that came with your OCP. Talk to your health care provider if you have questions.  SEEK MEDICAL CARE IF:   You develop nausea and vomiting.   You have abnormal vaginal discharge or bleeding.   You develop a rash.   You miss your menstrual period.   You are losing   your hair.   You need treatment for mood swings or depression.   You get dizzy when taking the OCP.   You develop acne from taking the OCP.   You become pregnant.  SEEK IMMEDIATE MEDICAL CARE IF:   You develop chest pain.   You develop shortness of breath.   You have an uncontrolled or severe headache.   You develop numbness or slurred speech.   You develop visual problems.   You develop pain, redness, and swelling in the legs.    This information is not intended to replace advice given to you by your health care provider. Make sure you discuss any questions you have with your health care provider.   Document  Released: 05/27/2011 Document Revised: 06/28/2014 Document Reviewed: 11/26/2012 Elsevier Interactive Patient Education 2016 Elsevier Inc.  Ethinyl Estradiol; Norethindrone Acetate tablets (contraception) What is this medicine? ETHINYL ESTRADIOL; NORETHINDRONE ACETATE (ETH in il es tra DYE ole; nor eth IN drone AS e tate) is an oral contraceptive. The products combine two types of female hormones, an estrogen and a progestin. They are used to prevent ovulation and pregnancy. This medicine may be used for other purposes; ask your health care provider or pharmacist if you have questions. What should I tell my health care provider before I take this medicine? They need to know if you have or ever had any of these conditions: -abnormal vaginal bleeding -blood vessel disease or blood clots -breast, cervical, endometrial, ovarian, liver, or uterine cancer -diabetes -gallbladder disease -heart disease or recent heart attack -high blood pressure -high cholesterol -kidney disease -liver disease -migraine headaches -stroke -systemic lupus erythematosus (SLE) -tobacco smoker -an unusual or allergic reaction to estrogens, progestins, other medicines, foods, dyes, or preservatives -pregnant or trying to get pregnant -breast-feeding How should I use this medicine? Take this medicine by mouth. To reduce nausea, this medicine may be taken with food. Follow the directions on the prescription label. Take this medicine at the same time each day and in the order directed on the package. Do not take your medicine more often than directed. Contact your pediatrician regarding the use of this medicine in children. Special care may be needed. This medicine has been used in female children who have started having menstrual periods. A patient package insert for the product will be given with each prescription and refill. Read this sheet carefully each time. The sheet may change frequently. Overdosage: If you  think you have taken too much of this medicine contact a poison control center or emergency room at once. NOTE: This medicine is only for you. Do not share this medicine with others. What if I miss a dose? If you miss a dose, refer to the patient information sheet you received with your medicine for direction. If you miss more than one pill, this medicine may not be as effective and you may need to use another form of birth control. What may interact with this medicine? -acetaminophen -antibiotics or medicines for infections, especially rifampin, rifabutin, rifapentine, and griseofulvin, and possibly penicillins or tetracyclines -aprepitant -ascorbic acid (vitamin C) -atorvastatin -barbiturate medicines, such as phenobarbital -bosentan -carbamazepine -caffeine -clofibrate -cyclosporine -dantrolene -doxercalciferol -felbamate -grapefruit juice -hydrocortisone -medicines for anxiety or sleeping problems, such as diazepam or temazepam -medicines for diabetes, including pioglitazone -mineral oil -modafinil -mycophenolate -nefazodone -oxcarbazepine -phenytoin -prednisolone -ritonavir or other medicines for HIV infection or AIDS -rosuvastatin -selegiline -soy isoflavones supplements -St. John's wort -tamoxifen or raloxifene -theophylline -thyroid hormones -topiramate -warfarin This list may not describe all   possible interactions. Give your health care provider a list of all the medicines, herbs, non-prescription drugs, or dietary supplements you use. Also tell them if you smoke, drink alcohol, or use illegal drugs. Some items may interact with your medicine. What should I watch for while using this medicine? Visit your doctor or health care professional for regular checks on your progress. You will need a regular breast and pelvic exam and Pap smear while on this medicine. Use an additional method of contraception during the first cycle that you take these tablets. If you have  any reason to think you are pregnant, stop taking this medicine right away and contact your doctor or health care professional. If you are taking this medicine for hormone related problems, it may take several cycles of use to see improvement in your condition. Smoking increases the risk of getting a blood clot or having a stroke while you are taking birth control pills, especially if you are more than 31 years old. You are strongly advised not to smoke. This medicine can make your body retain fluid, making your fingers, hands, or ankles swell. Your blood pressure can go up. Contact your doctor or health care professional if you feel you are retaining fluid. This medicine can make you more sensitive to the sun. Keep out of the sun. If you cannot avoid being in the sun, wear protective clothing and use sunscreen. Do not use sun lamps or tanning beds/booths. If you wear contact lenses and notice visual changes, or if the lenses begin to feel uncomfortable, consult your eye care specialist. In some women, tenderness, swelling, or minor bleeding of the gums may occur. Notify your dentist if this happens. Brushing and flossing your teeth regularly may help limit this. See your dentist regularly and inform your dentist of the medicines you are taking. If you are going to have elective surgery, you may need to stop taking this medicine before the surgery. Consult your health care professional for advice. This medicine does not protect you against HIV infection (AIDS) or any other sexually transmitted diseases. What side effects may I notice from receiving this medicine? Side effects that you should report to your doctor or health care professional as soon as possible: -breast tissue changes or discharge -changes in vaginal bleeding during your period or between your periods -chest pain -coughing up blood -dizziness or fainting spells -headaches or migraines -leg, arm or groin pain -severe or sudden  headaches -stomach pain (severe) -sudden shortness of breath -sudden loss of coordination, especially on one side of the body -speech problems -symptoms of vaginal infection like itching, irritation or unusual discharge -tenderness in the upper abdomen -vomiting -weakness or numbness in the arms or legs, especially on one side of the body -yellowing of the eyes or skin Side effects that usually do not require medical attention (report to your doctor or health care professional if they continue or are bothersome): -breakthrough bleeding and spotting that continues beyond the 3 initial cycles of pills -breast tenderness -mood changes, anxiety, depression, frustration, anger, or emotional outbursts -increased sensitivity to sun or ultraviolet light -nausea -skin rash, acne, or brown spots on the skin -weight gain (slight) This list may not describe all possible side effects. Call your doctor for medical advice about side effects. You may report side effects to FDA at 1-800-FDA-1088. Where should I keep my medicine? Keep out of the reach of children. Store at room temperature between 15 and 30 degrees C (59 and 86 degrees F).   Throw away any unused medicine after the expiration date. NOTE: This sheet is a summary. It may not cover all possible information. If you have questions about this medicine, talk to your doctor, pharmacist, or health care provider.    2016, Elsevier/Gold Standard. (2012-10-13 15:35:20)  

## 2015-08-12 NOTE — Progress Notes (Signed)
Patient ID: Teresa Bright, female   DOB: October 06, 1985, 30 y.o.   MRN: [id number] Arlyne Ehler is a 30 y.o. year old Caucasian female here for [place] removal.  Patient given informed consent for removal of her Nexplanon. Had Nexplanon placed Nov 2016, has been bleeding for last 4 weeks, not interested in megace to help stop bleeding. Doesn't like unpredictability of bleeding w/ nexplanon. Wants coc's. Does not smoke, no h/o HTN, DVT/PE, CVA, MI, or migraines w/ aura.   There were no vitals taken for this visit.  Appropriate time out taken. Nexplanon site identified.  Area prepped in usual sterile fashon. One cc of 2% lidocaine was used to anesthetize the area at the distal end of the implant. A small stab incision was made right beside the implant on the distal portion.  The Nexplanon rod was grasped using hemostats and removed without difficulty.  There was less than 3 cc blood loss. There were no complications.  Steri-strips were applied over the small incision and a pressure bandage was applied.  The patient tolerated the procedure well.  She was instructed to keep the area clean and dry, remove pressure bandage in 24 hours, and keep insertion site covered with the steri-strip for 3-5 days.    Rx LoLoestrin 3pk w/ 3RF Keep next appt 3/7 as scheduled for u/s to f/u ovarian cyst, then pap & physical after, then 28mths from now for coc f/u  Tawnya Crook CNM, [facility] 08/12/2015 3:26 PM

## 2015-08-25 ENCOUNTER — Other Ambulatory Visit: Payer: Self-pay | Admitting: Women's Health

## 2015-08-25 DIAGNOSIS — N83201 Unspecified ovarian cyst, right side: Secondary | ICD-10-CM

## 2015-08-26 ENCOUNTER — Other Ambulatory Visit (HOSPITAL_COMMUNITY)
Admission: RE | Admit: 2015-08-26 | Discharge: 2015-08-26 | Disposition: A | Discharge status: Home / Self Care | Payer: Managed Care, Other (non HMO) | Source: Ambulatory Visit | Attending: Obstetrics & Gynecology | Admitting: Obstetrics & Gynecology

## 2015-08-26 ENCOUNTER — Ambulatory Visit (INDEPENDENT_AMBULATORY_CARE_PROVIDER_SITE_OTHER): Payer: Managed Care, Other (non HMO) | Admitting: Women's Health

## 2015-08-26 ENCOUNTER — Ambulatory Visit (INDEPENDENT_AMBULATORY_CARE_PROVIDER_SITE_OTHER): Payer: Managed Care, Other (non HMO)

## 2015-08-26 ENCOUNTER — Other Ambulatory Visit: Payer: Self-pay | Admitting: Women's Health

## 2015-08-26 ENCOUNTER — Encounter: Payer: Self-pay | Admitting: Women's Health

## 2015-08-26 VITALS — BP 118/80 | HR 66 | Ht 62.5 in | Wt 192.0 lb

## 2015-08-26 DIAGNOSIS — N854 Malposition of uterus: Secondary | ICD-10-CM | POA: Diagnosis not present

## 2015-08-26 DIAGNOSIS — N83201 Unspecified ovarian cyst, right side: Secondary | ICD-10-CM | POA: Diagnosis not present

## 2015-08-26 DIAGNOSIS — Z01419 Encounter for gynecological examination (general) (routine) without abnormal findings: Secondary | ICD-10-CM | POA: Insufficient documentation

## 2015-08-26 DIAGNOSIS — Z01411 Encounter for gynecological examination (general) (routine) with abnormal findings: Secondary | ICD-10-CM | POA: Diagnosis not present

## 2015-08-26 DIAGNOSIS — Z1151 Encounter for screening for human papillomavirus (HPV): Secondary | ICD-10-CM | POA: Diagnosis present

## 2015-08-26 NOTE — Progress Notes (Signed)
Patient ID: Teresa Bright, female   DOB: 1985-10-08, 30 y.o.   MRN: [id number] Subjective:   Teresa Bright is a 30 y.o. G22P1001 Caucasian female here for a routine well-woman exam and f/u pelvic u/s this am to assess known Rt ovarian cyst. Denies RLQ/pelvic pain. She is 31 months postpartum. Bottlefeeding. Started on LoLoestrin Fe 08/12/15 when nexplanon removed for report of persistent bleeding since placement and not interested in megace. States period has now stopped. Doing well w/ COCs.  Patient's last menstrual period was 07/23/2015 (approximate).    Pelvic u/s today reveals stable simple Rt ovarian cyst, essentially same dimensions as last u/s in Nov, w/ good doppler flow.  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  Past Medical History Past Medical History  Diagnosis Date  . Pregnant 08/20/2014  . GERD (gastroesophageal reflux disease)     Past Surgical History Past Surgical History  Procedure Laterality Date  . Wisdom tooth extraction      Gynecologic History G1P1001  Patient's last menstrual period was 07/23/2015 (approximate). Contraception: OCP (estrogen/progesterone) Last Pap: 2014. Results were: normal Last mammogram: never. Results were: n/a Last TCS: n/a  Obstetric History OB History  Gravida Para Term Preterm AB SAB TAB Ectopic Multiple Living  1 1 1       0 1    # Outcome Date GA Lbr Len/2nd Weight Sex Delivery Anes PTL Lv   1 Term 04/04/15 [redacted]w[redacted]d 16:08 / 02:41 7 lb 1.9 oz (3.229 kg) M Vag-Spont EPI  Y      Current Medications Current Outpatient Prescriptions on File Prior to Visit  Medication Sig Dispense Refill  . Ascorbic Acid (VITAMIN C) 100 MG tablet Take 100 mg by mouth daily.    Marland Kitchen ibuprofen (ADVIL,MOTRIN) 600 MG tablet Take 1 tablet (600 mg total) by mouth every 6 (six) hours as needed for mild pain, moderate pain or cramping. 30 tablet 0  . Norethindrone-Ethinyl Estradiol-Fe Biphas (LO LOESTRIN FE) 1 MG-10 MCG / 10 MCG tablet Take 1 tablet by mouth daily. 3 Package 3   No current facility-administered medications on file prior to visit.    Review of Systems Patient denies any headaches, blurred vision, shortness of breath, chest pain, abdominal pain, problems with bowel movements, urination, or intercourse.  Objective:  BP 118/80 mmHg  Pulse 66  Ht 5' 2.5" (1.588 m)  Wt 192 lb (87.091 kg)  BMI 34.54 kg/m2  LMP 07/23/2015 (Approximate) Physical Exam  General:  Well developed, well nourished, no acute distress. She is alert and oriented x3. Skin:  Warm and dry Neck:  Midline trachea, no thyromegaly or nodules Cardiovascular: Regular rate and rhythm, no murmur heard Lungs:  Effort normal, all lung fields clear to auscultation bilaterally Breasts:  No dominant palpable mass, retraction, or nipple discharge Abdomen:  Soft, non tender, no hepatosplenomegaly or masses Pelvic:  External genitalia is normal in appearance.  The vagina is normal in appearance. The cervix is bulbous, no CMT.  Thin prep pap is done w/ HR HPV cotesting. Uterus is felt to  be normal size, shape, and contour.  No adnexal masses or tenderness noted. Extremities:  No swelling or varicosities noted Psych:  She has a normal mood and affect  Today's pelvic u/s:  Teresa Bright is a 30 y.o. G1P1001 LMP 07/23/2015 for a pelvic sonogram for f/u rt ov cyst.  Uterus 8.4 x 6.3 x 4.13 cm, normal homogenous  anteverted uterus   Endometrium 8 mm, symmetrical, wnl  Right ovary 6.4 x 7.5 x 5.45 cm, simple right ov cyst 6.9 x 5.8 x 5.8cm no significant change  Left ovary 4.3 x 3.6 x 2.5 cm, wnl  Small amount of cul de sac fluid,no pain,arterial and venous flow seen  Technician Comments:  PELVIC US TA/TV W/DOPPLER: normal homogenous anteverted uterus,simple right ov cyst 6.9 x 5.8 x 5.8cm no significant change,arterial and venous flow seen,normal lt ov,EEC 8 mm,small amount of cul de sac fluid . Amber Heide Guile 08/26/2015 9:19 AM      Assessment:   Healthy well-woman exam Persistent Rt simple ovarian cyst  Plan:  Discussed persistent stable Rt ovarian simple cyst w/ JVF, recommends CA125 option of monitoring vs. Removal Discussed options w/ pt, wants to think about it and let us know, leaning towards removal- will call us back Reviewed ovarian torsion signs/sx, reasons to seek care CBC, CMP, TSH, CA125 today F/U 17mths as scheduled for COC f/u, or sooner if needed Mammogram @30yo  or sooner if problems Colonoscopy @30yo  or sooner if problems  Tawnya Crook CNM, [facility] 08/26/2015 9:11 AM

## 2015-08-26 NOTE — Patient Instructions (Signed)
Ovarian Cystectomy Ovarian cystectomy is surgery to remove a fluid-filled sac (cyst) on an ovary. The ovaries are small organs that produce eggs in women. Various types of cysts can form on the ovaries. Most are not cancerous. Surgery may be done if a cyst is large or is causing symptoms such as pain. It may also be done for a cyst that is or might be cancerous. This surgery can be done using a laparoscopic technique or an open abdominal technique. The laparoscopic technique involves smaller cuts (incisions) and a faster recovery time. The technique used will depend on your age, the type of cyst, and whether the cyst is cancerous. The laparoscopic technique is not used for a cancerous cyst. LET YOUR HEALTH CARE PROVIDER KNOW ABOUT:   Any allergies you have.  All medicines you are taking, including vitamins, herbs, eye drops, creams, and over-the-counter medicines.  Previous problems you or members of your family have had with the use of anesthetics.  Any blood disorders you have.  Previous surgeries you have had.  Medical conditions you have.  Any chance you might be pregnant. RISKS AND COMPLICATIONS Generally, this is a safe procedure. However, as with any procedure, complications can occur. Possible complications include:  Excessive bleeding.  Infection.  Injury to other organs.  Blood clots.  Becoming incapable of getting pregnant (infertile). BEFORE THE PROCEDURE  Ask your health care provider about changing or stopping any regular medicines. Avoid taking aspirin, ibuprofen, or blood thinners as directed by your health care provider.  Do not eat or drink anything after midnight the night before surgery.  If you smoke, do not smoke for at least 2 weeks before your surgery.  Do not drink alcohol the day before your surgery.  Let your health care provider know if you develop a cold or any infection before your surgery.  Arrange for someone to drive you home after the  procedure or after your hospital stay. Also arrange for someone to help you with activities during recovery. PROCEDURE  Either a laparoscopic technique or an open abdominal technique may be used for this surgery.  Small monitors will be put on your body. They are used to check your heart, blood pressure, and oxygen level.   An IV access tube will be put into one of your veins. Medicine will be able to flow directly into your body through this IV tube.   You might be given a medicine to help you relax (sedative).   You will be given a medicine to make you sleep (general anesthetic). A breathing tube may be placed into your lungs during the procedure. Laparoscopic Technique  Several small cuts (incisions) are made in your abdomen. These are typically about 1 to 2 cm long.   Your abdomen will be filled with carbon dioxide gas so that it expands. This gives the surgeon more room to operate and makes your organs easier to see.   A thin, lighted tube with a tiny camera on the end (laparoscope) is put through one of the small incisions. The camera on the laparoscope sends a picture to a TV screen in the operating room. This gives the surgeon a good view inside your abdomen.   Hollow tubes are put through the other small incisions in your abdomen. The tools needed for the procedure are put through these tubes.  The ovary with the cyst is identified, and the cyst is removed. It is sent to the lab for testing. If it is cancer, both ovaries   may need to be removed during a different surgery. °· Tools are removed. The incisions are then closed with stitches or skin glue, and dressings may be applied. °Open Abdominal Technique °· A single large incision is made along your bikini line or in the middle of your lower abdomen. °· The ovary with the cyst is identified, and the cyst is removed. It is sent to the lab for testing. If it is cancer, both ovaries may need to be removed during a different  surgery. °· The incision is then closed with stitches or staples. °AFTER THE PROCEDURE  °· You will wake up from anesthesia and be taken to a recovery area. °· If you had laparoscopic surgery, you may be able to go home the same day, or you may need to stay in the hospital overnight. °· If you had open abdominal surgery, you will need to stay in the hospital for a few days. °· Your IV access tube and catheter will be removed the first or second day, after you are able to eat and drink enough. °· You may be given medicine to relieve pain or to help you sleep. °· You may be given an antibiotic medicine if needed. °  °This information is not intended to replace advice given to you by your health care provider. Make sure you discuss any questions you have with your health care provider. °  °Document Released: 04/04/2007 Document Revised: 03/28/2013 Document Reviewed: 01/17/2013 °Elsevier Interactive Patient Education ©2016 Elsevier Inc. ° °

## 2015-08-26 NOTE — Progress Notes (Signed)
PELVIC US TA/TV W/DOPPLER: normal homogenous anteverted uterus,simple right ov cyst 6.9 x 5.8 x 5.8cm no significant change,arterial and venous flow seen,normal lt ov,EEC 8 mm,small amount of cul de sac fluid .

## 2015-08-27 ENCOUNTER — Telehealth: Payer: Self-pay | Admitting: Women's Health

## 2015-08-27 LAB — COMPREHENSIVE METABOLIC PANEL
ALK PHOS: 50 IU/L (ref 39–117)
ALT: 13 IU/L (ref 0–32)
AST: 14 IU/L (ref 0–40)
Albumin/Globulin Ratio: 2 (ref 1.1–2.5)
Albumin: 4.3 g/dL (ref 3.5–5.5)
BILIRUBIN TOTAL: 0.9 mg/dL (ref 0.0–1.2)
BUN / CREAT RATIO: 19 (ref 8–20)
BUN: 12 mg/dL (ref 6–20)
CHLORIDE: 104 mmol/L (ref 96–106)
CO2: 22 mmol/L (ref 18–29)
Calcium: 8.9 mg/dL (ref 8.7–10.2)
Creatinine, Ser: 0.63 mg/dL (ref 0.57–1.00)
GFR calc Af Amer: 139 mL/min/{1.73_m2} (ref >59)
GFR calc non Af Amer: 121 mL/min/{1.73_m2} (ref >59)
GLUCOSE: 87 mg/dL (ref 65–99)
Globulin, Total: 2.1 g/dL (ref 1.5–4.5)
Potassium: 4.3 mmol/L (ref 3.5–5.2)
SODIUM: 141 mmol/L (ref 134–144)
Total Protein: 6.4 g/dL (ref 6.0–8.5)

## 2015-08-27 LAB — CBC
Hematocrit: 40.7 % (ref 34.0–46.6)
Hemoglobin: 13.5 g/dL (ref 11.1–15.9)
MCH: 29.2 pg (ref 26.6–33.0)
MCHC: 33.2 g/dL (ref 31.5–35.7)
MCV: 88 fL (ref 79–97)
PLATELETS: 285 10*3/uL (ref 150–379)
RBC: 4.63 x10E6/uL (ref 3.77–5.28)
RDW: 13.2 % (ref 12.3–15.4)
WBC: 6.5 10*3/uL (ref 3.4–10.8)

## 2015-08-27 LAB — CA 125: CA 125: 12.2 U/mL (ref 0.0–38.1)

## 2015-08-27 LAB — TSH: TSH: 0.466 u[IU]/mL (ref 0.450–4.500)

## 2015-08-27 NOTE — Telephone Encounter (Signed)
Pt returned my call, notified her of normal labs including CA125. Is leaning towards cystectomy but not sure when she will be able to do it. Will call back and schedule pre-op w/ MD.  Roma Schanz, CNM, [facility] 08/27/2015 10:09 AM

## 2015-08-28 LAB — CYTOLOGY - PAP

## 2015-11-10 ENCOUNTER — Ambulatory Visit: Payer: Managed Care, Other (non HMO) | Admitting: Women's Health

## 2015-11-18 ENCOUNTER — Ambulatory Visit: Payer: Managed Care, Other (non HMO) | Admitting: Women's Health

## 2015-12-02 ENCOUNTER — Ambulatory Visit (INDEPENDENT_AMBULATORY_CARE_PROVIDER_SITE_OTHER): Payer: Managed Care, Other (non HMO) | Admitting: Women's Health

## 2015-12-02 ENCOUNTER — Encounter: Payer: Self-pay | Admitting: Women's Health

## 2015-12-02 VITALS — BP 118/70 | HR 72 | Wt 198.0 lb

## 2015-12-02 DIAGNOSIS — Z3041 Encounter for surveillance of contraceptive pills: Secondary | ICD-10-CM | POA: Diagnosis not present

## 2015-12-02 DIAGNOSIS — N83209 Unspecified ovarian cyst, unspecified side: Secondary | ICD-10-CM | POA: Diagnosis not present

## 2015-12-02 NOTE — Progress Notes (Signed)
Patient ID: Teresa Bright, female   DOB: 1986-06-07, 30 y.o.   MRN: [id number]   [facility] Visit  Patient name: Teresa Bright MRN [id number]  Date of birth: [birth year]  CC & HPI:  [name] is a 30 y.o. G63P1001 Caucasian female presenting today for f/u on COCs. On LoLoestrin since 08/12/15, doing well, normal periods w/ some mild cramping, overall likes the pills. Does have Rt simple ovarian cyst, last u/s 6.9x5.8x5.8 at 53mths pp w/ normal CA125- had discussed monitoring vs. Cystectomy at last visit- pt was to call back and let us know, but states the possibility of infection/damage to surrounding organs w/ cystectomy scares her- would prefer just to watch it at this point.   Denies any RLQ pain.  Patient's last menstrual period was 11/29/2015. The current method of family planning is OCP (estrogen/progesterone). Last pap 08/26/15 neg w/ -HRHPV  Pertinent History Reviewed:  Medical & Surgical Hx:   Past medical, surgical, family, and social history reviewed in electronic medical record Medications: Reviewed & Updated - see associated section Allergies: Reviewed in electronic medical record  Objective Findings:  Vitals: BP 118/70 mmHg  Pulse 72  Wt 198 lb (89.812 kg)  LMP 11/29/2015 Body mass index is 35.62 kg/(m^2).  Physical Examination: General appearance - alert, well appearing, and in no distress  No results found for this or any previous visit (from the past 24 hour(s)).   Assessment & Plan:  A:   COC f/u  Rt ovarian cyst  P:  Continue LoLoestrin  Will recheck ovarian cyst in 69mths per pt preference, unless earlier indicated  Return in about 2 months (around 02/01/2016) for US:GYN and f/u w/ me.  Tawnya Crook CNM, [facility] 12/02/2015 9:04 AM

## 2016-01-30 ENCOUNTER — Other Ambulatory Visit: Payer: Self-pay | Admitting: Women's Health

## 2016-01-30 DIAGNOSIS — N83209 Unspecified ovarian cyst, unspecified side: Secondary | ICD-10-CM

## 2016-02-02 ENCOUNTER — Encounter: Payer: Self-pay | Admitting: Women's Health

## 2016-02-02 ENCOUNTER — Other Ambulatory Visit: Payer: Self-pay | Admitting: Women's Health

## 2016-02-02 ENCOUNTER — Ambulatory Visit (INDEPENDENT_AMBULATORY_CARE_PROVIDER_SITE_OTHER): Payer: Managed Care, Other (non HMO)

## 2016-02-02 ENCOUNTER — Ambulatory Visit (INDEPENDENT_AMBULATORY_CARE_PROVIDER_SITE_OTHER): Payer: Managed Care, Other (non HMO) | Admitting: Women's Health

## 2016-02-02 DIAGNOSIS — N83209 Unspecified ovarian cyst, unspecified side: Secondary | ICD-10-CM

## 2016-02-02 DIAGNOSIS — N83291 Other ovarian cyst, right side: Secondary | ICD-10-CM | POA: Diagnosis not present

## 2016-02-02 DIAGNOSIS — N83292 Other ovarian cyst, left side: Secondary | ICD-10-CM

## 2016-02-02 DIAGNOSIS — N83201 Unspecified ovarian cyst, right side: Secondary | ICD-10-CM

## 2016-02-02 NOTE — Progress Notes (Signed)
   [facility] Visit  Patient name: Teresa Bright MRN [id number]  Date of birth: April 01, 1986  CC & HPI:  Teresa Bright is a 30 y.o. G40P1001 Caucasian female presenting today for f/u after pelvic u/s to evaluate persistent Rt ovarian cyst. Rt simple ovarian cyst first discovered during her recent 2016 pregnancy, was 11.1x9.6x7.4cm at largest measurement at 13wks of pregnancy. Repeat u/s on 08/26/15 at 39mths pp was 6.9x5.8x5.8 with neg CA125, pt declined surgical intervention at that time. Denies RLQ/pelvic pain.  Patient's last menstrual period was 01/29/2016 (exact date). The current method of family planning is OCP (estrogen/progesterone). Last pap 08/26/15, neg w/ -HRHPV  Pertinent History Reviewed:  Medical & Surgical Hx:   Past medical, surgical, family, and social history reviewed in electronic medical record Medications: Reviewed & Updated - see associated section Allergies: Reviewed in electronic medical record  Objective Findings:  Vitals: BP 108/78 (BP Location: Right Arm, Patient Position: Sitting, Cuff Size: Normal)   Pulse 64   Wt 198 lb (89.8 kg)   LMP 01/29/2016 (Exact Date)   BMI 35.64 kg/m  Body mass index is 35.64 kg/m.  Physical Examination: General appearance - alert, well appearing, and in no distress  Today's Pelvic U/S: Teresa Bright is a 30 y.o. G1P1001 LMP 01/29/2016 for a pelvic sonogram to f/u right ovarian cyst.  Uterus                      8.3 x 5.5 x 4.9 cm, homogeneous anteverted uterus,wnl  Endometrium          4.1 mm, symmetrical, wnl  Right ovary             10.4 x 7.4 x 9..2cm,simple right ov cyst 10.4 x 6.6 x 8.2 cm   Left ovary                3.4 x 2.9 x 2.7 cm,simple lt ov cyst 2.6 x 2.7 x 2.3 cm   Arterial and venous flow notes in both ov's,no free fluid seen,no pain during ultrasound  Technician Comments: PELVIC US TA/TV W/DOPPLER: Homogeneous anteverted uterus,simple right ov cyst 10.4 x 6.6 x 8.2 cm,simple lt ov cyst 2.6  x 2.7 x 2.3 cm, arterial and venous flow notes in both ov's,no free fluid seen,no pain during ultrasound [employer] 02/02/2016 9:16 AM  Assessment & Plan:  A:   Large chronic Rt simple ovarian cyst, growing, good flow to ovary  New small Lt simple ovarian cyst  P:  Discussed options of continuing to monitor vs. cystectomy d/t concern for ovarian torsion  Is interested in cystectomy, but still hesitant d/t potential complications w/ surgery  Return for this week if possible w/ [facility] for pre-op. and to discuss concerns/options  Tawnya Crook CNM, [facility] 02/02/2016 9:37 AM

## 2016-02-02 NOTE — Progress Notes (Signed)
PELVIC US TA/TV W/DOPPLER: Homogeneous anteverted uterus,simple right ov cyst 10.4 x 6.6 x 8.2 cm,simple lt ov cyst 2.6 x 2.7 x 2.3 cm, arterial and venous flow notes in both ov's,no free fluid seen,no pain during ultrasound

## 2016-02-02 NOTE — Patient Instructions (Signed)
Ovarian Cystectomy Ovarian cystectomy is surgery to remove a fluid-filled sac (cyst) on an ovary. The ovaries are small organs that produce eggs in women. Various types of cysts can form on the ovaries. Most are not cancerous. Surgery may be done if a cyst is large or is causing symptoms such as pain. It may also be done for a cyst that is or might be cancerous. This surgery can be done using a laparoscopic technique or an open abdominal technique. The laparoscopic technique involves smaller cuts (incisions) and a faster recovery time. The technique used will depend on your age, the type of cyst, and whether the cyst is cancerous. The laparoscopic technique is not used for a cancerous cyst. LET YOUR HEALTH CARE PROVIDER KNOW ABOUT:   Any allergies you have.  All medicines you are taking, including vitamins, herbs, eye drops, creams, and over-the-counter medicines.  Previous problems you or members of your family have had with the use of anesthetics.  Any blood disorders you have.  Previous surgeries you have had.  Medical conditions you have.  Any chance you might be pregnant. RISKS AND COMPLICATIONS Generally, this is a safe procedure. However, as with any procedure, complications can occur. Possible complications include:  Excessive bleeding.  Infection.  Injury to other organs.  Blood clots.  Becoming incapable of getting pregnant (infertile). BEFORE THE PROCEDURE  Ask your health care provider about changing or stopping any regular medicines. Avoid taking aspirin, ibuprofen, or blood thinners as directed by your health care provider.  Do not eat or drink anything after midnight the night before surgery.  If you smoke, do not smoke for at least 2 weeks before your surgery.  Do not drink alcohol the day before your surgery.  Let your health care provider know if you develop a cold or any infection before your surgery.  Arrange for someone to drive you home after the  procedure or after your hospital stay. Also arrange for someone to help you with activities during recovery. PROCEDURE  Either a laparoscopic technique or an open abdominal technique may be used for this surgery.  Small monitors will be put on your body. They are used to check your heart, blood pressure, and oxygen level.   An IV access tube will be put into one of your veins. Medicine will be able to flow directly into your body through this IV tube.   You might be given a medicine to help you relax (sedative).   You will be given a medicine to make you sleep (general anesthetic). A breathing tube may be placed into your lungs during the procedure. Laparoscopic Technique  Several small cuts (incisions) are made in your abdomen. These are typically about 1 to 2 cm long.   Your abdomen will be filled with carbon dioxide gas so that it expands. This gives the surgeon more room to operate and makes your organs easier to see.   A thin, lighted tube with a tiny camera on the end (laparoscope) is put through one of the small incisions. The camera on the laparoscope sends a picture to a TV screen in the operating room. This gives the surgeon a good view inside your abdomen.   Hollow tubes are put through the other small incisions in your abdomen. The tools needed for the procedure are put through these tubes.  The ovary with the cyst is identified, and the cyst is removed. It is sent to the lab for testing. If it is cancer, both ovaries   may need to be removed during a different surgery.  Tools are removed. The incisions are then closed with stitches or skin glue, and dressings may be applied. Open Abdominal Technique  A single large incision is made along your bikini line or in the middle of your lower abdomen.  The ovary with the cyst is identified, and the cyst is removed. It is sent to the lab for testing. If it is cancer, both ovaries may need to be removed during a different  surgery.  The incision is then closed with stitches or staples. AFTER THE PROCEDURE   You will wake up from anesthesia and be taken to a recovery area.  If you had laparoscopic surgery, you may be able to go home the same day, or you may need to stay in the hospital overnight.  If you had open abdominal surgery, you will need to stay in the hospital for a few days.  Your IV access tube and catheter will be removed the first or second day, after you are able to eat and drink enough.  You may be given medicine to relieve pain or to help you sleep.  You may be given an antibiotic medicine if needed.   This information is not intended to replace advice given to you by your health care provider. Make sure you discuss any questions you have with your health care provider.   Document Released: 04/04/2007 Document Revised: 03/28/2013 Document Reviewed: 01/17/2013 Elsevier Interactive Patient Education [employer].

## 2016-02-05 ENCOUNTER — Encounter: Payer: Managed Care, Other (non HMO) | Admitting: Obstetrics & Gynecology

## 2016-02-11 ENCOUNTER — Encounter: Payer: Self-pay | Admitting: Obstetrics & Gynecology

## 2016-02-11 ENCOUNTER — Ambulatory Visit (INDEPENDENT_AMBULATORY_CARE_PROVIDER_SITE_OTHER): Payer: Managed Care, Other (non HMO) | Admitting: Obstetrics & Gynecology

## 2016-02-11 VITALS — BP 110/80 | HR 78 | Ht 64.0 in | Wt 198.0 lb

## 2016-02-11 DIAGNOSIS — Z01818 Encounter for other preprocedural examination: Secondary | ICD-10-CM

## 2016-02-11 DIAGNOSIS — N83201 Unspecified ovarian cyst, right side: Secondary | ICD-10-CM | POA: Diagnosis not present

## 2016-02-27 ENCOUNTER — Other Ambulatory Visit: Payer: Self-pay | Admitting: Obstetrics & Gynecology

## 2016-02-27 ENCOUNTER — Encounter (HOSPITAL_COMMUNITY)
Admission: RE | Admit: 2016-02-27 | Discharge: 2016-02-27 | Disposition: A | Discharge status: Home / Self Care | Payer: Managed Care, Other (non HMO) | Source: Ambulatory Visit | Attending: Obstetrics & Gynecology | Admitting: Obstetrics & Gynecology

## 2016-02-27 ENCOUNTER — Encounter (HOSPITAL_COMMUNITY): Payer: Self-pay

## 2016-02-27 DIAGNOSIS — Z01812 Encounter for preprocedural laboratory examination: Secondary | ICD-10-CM | POA: Diagnosis not present

## 2016-02-27 DIAGNOSIS — N839 Noninflammatory disorder of ovary, fallopian tube and broad ligament, unspecified: Secondary | ICD-10-CM | POA: Insufficient documentation

## 2016-02-27 LAB — COMPREHENSIVE METABOLIC PANEL
ALBUMIN: 4.1 g/dL (ref 3.5–5.0)
ALK PHOS: 52 U/L (ref 38–126)
ALT: 13 U/L — ABNORMAL LOW (ref 14–54)
ANION GAP: 14 (ref 5–15)
AST: 22 U/L (ref 15–41)
BILIRUBIN TOTAL: 0.2 mg/dL — AB (ref 0.3–1.2)
BUN: UNDETERMINED mg/dL (ref 6–20)
CALCIUM: 9 mg/dL (ref 8.9–10.3)
CO2: 16 mmol/L — ABNORMAL LOW (ref 22–32)
Chloride: 109 mmol/L (ref 101–111)
Creatinine, Ser: UNDETERMINED mg/dL (ref 0.44–1.00)
GLUCOSE: 88 mg/dL (ref 65–99)
POTASSIUM: 4.1 mmol/L (ref 3.5–5.1)
Sodium: 139 mmol/L (ref 135–145)
TOTAL PROTEIN: UNDETERMINED g/dL (ref 6.5–8.1)

## 2016-02-27 LAB — URINE MICROSCOPIC-ADD ON

## 2016-02-27 LAB — HCG, QUANTITATIVE, PREGNANCY

## 2016-02-27 LAB — URINALYSIS, ROUTINE W REFLEX MICROSCOPIC
Glucose, UA: NEGATIVE mg/dL
Ketones, ur: NEGATIVE mg/dL
NITRITE: NEGATIVE
Protein, ur: 100 mg/dL — AB
SPECIFIC GRAVITY, URINE: 1.025 (ref 1.005–1.030)
pH: 5.5 (ref 5.0–8.0)

## 2016-02-27 LAB — CBC
HCT: 40.2 % (ref 36.0–46.0)
HEMOGLOBIN: 13.5 g/dL (ref 12.0–15.0)
MCH: 29.5 pg (ref 26.0–34.0)
MCHC: 33.6 g/dL (ref 30.0–36.0)
MCV: 87.8 fL (ref 78.0–100.0)
Platelets: 297 10*3/uL (ref 150–400)
RBC: 4.58 MIL/uL (ref 3.87–5.11)
RDW: 12.5 % (ref 11.5–15.5)
WBC: 5.8 10*3/uL (ref 4.0–10.5)

## 2016-02-27 NOTE — Patient Instructions (Signed)
Your procedure is scheduled on: 9?13?2017  Report to [facility] at  7:00   AM.  Call this number if you have problems the morning of surgery: [phone]   Remember:   Do not drink or eat food:After Midnight.     Do not wear jewelry, make-up or nail polish.  Do not wear lotions, powders, or perfumes. You may wear deodorant.  Do not shave 48 hours prior to surgery. Men may shave face and neck.  Do not bring valuables to the hospital.  Contacts, dentures or bridgework may not be worn into surgery.  Leave suitcase in the car. After surgery it may be brought to your room.  For patients admitted to the hospital, checkout time is 11:00 AM the day of discharge.   Patients discharged the day of surgery will not be allowed to drive home.    Special Instructions: Shower using CHG night before surgery and shower the day of surgery use CHG.  Use special wash - you have one bottle of CHG for all showers.  You should use approximately 1/2 of the bottle for each shower.  Ovarian Cyst An ovarian cyst is a fluid-filled sac that forms on an ovary. The ovaries are small organs that produce eggs in women. Various types of cysts can form on the ovaries. Most are not cancerous. Many do not cause problems, and they often go away on their own. Some may cause symptoms and require treatment. Common types of ovarian cysts include:  Functional cysts--These cysts may occur every month during the menstrual cycle. This is normal. The cysts usually go away with the next menstrual cycle if the woman does not get pregnant. Usually, there are no symptoms with a functional cyst.  Endometrioma cysts--These cysts form from the tissue that lines the uterus. They are also called "chocolate cysts" because they become filled with blood that turns brown. This type of cyst can cause pain in the lower abdomen during intercourse and with your menstrual period.  Cystadenoma cysts--This type develops from the cells on the outside  of the ovary. These cysts can get very big and cause lower abdomen pain and pain with intercourse. This type of cyst can twist on itself, cut off its blood supply, and cause severe pain. It can also easily rupture and cause a lot of pain.  Dermoid cysts--This type of cyst is sometimes found in both ovaries. These cysts may contain different kinds of body tissue, such as skin, teeth, hair, or cartilage. They usually do not cause symptoms unless they get very big.  Theca lutein cysts--These cysts occur when too much of a certain hormone (human chorionic gonadotropin) is produced and overstimulates the ovaries to produce an egg. This is most common after procedures used to assist with the conception of a baby (in vitro fertilization). CAUSES   Fertility drugs can cause a condition in which multiple large cysts are formed on the ovaries. This is called ovarian hyperstimulation syndrome.  A condition called polycystic ovary syndrome can cause hormonal imbalances that can lead to nonfunctional ovarian cysts. SIGNS AND SYMPTOMS  Many ovarian cysts do not cause symptoms. If symptoms are present, they may include:  Pelvic pain or pressure.  Pain in the lower abdomen.  Pain during sexual intercourse.  Increasing girth (swelling) of the abdomen.  Abnormal menstrual periods.  Increasing pain with menstrual periods.  Stopping having menstrual periods without being pregnant. DIAGNOSIS  These cysts are commonly found during a routine or annual pelvic exam. Tests  may be ordered to find out more about the cyst. These tests may include:  Ultrasound.  X-ray of the pelvis.  CT scan.  MRI.  Blood tests. TREATMENT  Many ovarian cysts go away on their own without treatment. Your health care provider may want to check your cyst regularly for 2-3 months to see if it changes. For women in menopause, it is particularly important to monitor a cyst closely because of the higher rate of ovarian cancer in  menopausal women. When treatment is needed, it may include any of the following:  A procedure to drain the cyst (aspiration). This may be done using a long needle and ultrasound. It can also be done through a laparoscopic procedure. This involves using a thin, lighted tube with a tiny camera on the end (laparoscope) inserted through a small incision.  Surgery to remove the whole cyst. This may be done using laparoscopic surgery or an open surgery involving a larger incision in the lower abdomen.  Hormone treatment or birth control pills. These methods are sometimes used to help dissolve a cyst. HOME CARE INSTRUCTIONS   Only take over-the-counter or prescription medicines as directed by your health care provider.  Follow up with your health care provider as directed.  Get regular pelvic exams and Pap tests. SEEK MEDICAL CARE IF:   Your periods are late, irregular, or painful, or they stop.  Your pelvic pain or abdominal pain does not go away.  Your abdomen becomes larger or swollen.  You have pressure on your bladder or trouble emptying your bladder completely.  You have pain during sexual intercourse.  You have feelings of fullness, pressure, or discomfort in your stomach.  You lose weight for no apparent reason.  You feel generally ill.  You become constipated.  You lose your appetite.  You develop acne.  You have an increase in body and facial hair.  You are gaining weight, without changing your exercise and eating habits.  You think you are pregnant. SEEK IMMEDIATE MEDICAL CARE IF:   You have increasing abdominal pain.  You feel sick to your stomach (nauseous), and you throw up (vomit).  You develop a fever that comes on suddenly.  You have abdominal pain during a bowel movement.  Your menstrual periods become heavier than usual. MAKE SURE YOU:  Understand these instructions.  Will watch your condition.  Will get help right away if you are not doing well  or get worse.   This information is not intended to replace advice given to you by your health care provider. Make sure you discuss any questions you have with your health care provider.   Document Released: 06/07/2005 Document Revised: 06/12/2013 Document Reviewed: 02/12/2013 Elsevier Interactive Patient Education 2016 [employer]  Ovarian Cystectomy, Care After Refer to this sheet in the next few weeks. These instructions provide you with information on caring for yourself after your procedure. Your health care provider may also give you more specific instructions. Your treatment has been planned according to current medical practices, but problems sometimes occur. Call your health care provider if you have any problems or questions after your procedure.  WHAT TO EXPECT AFTER THE PROCEDURE After your procedure, it is typical to have the following:  Pain in your abdomen, especially at the incision sites. You will be given pain medicines to control the pain.  Tiredness. This is a normal part of the recovery process. Your energy level will return to normal over the next several weeks.  Constipation. HOME  CARE INSTRUCTIONS  Only take over-the-counter or prescription medicines as directed by your health care provider. Avoid taking aspirin because it can cause bleeding.   Follow your health care provider's instructions for when to resume your regular diet, exercise, and activities.   Take rest breaks during the day as needed.  Do not douche or have sexual intercourse until you have permission from your health care provider.   Remove or change any bandages (dressings) as directed by your health care provider.   Do not drive until your health care provider approves.   Take showers instead of baths until your health care provider tells you otherwise.   If you become constipated, you may:   Use a mild laxative if your health care provider approves.  Add more fruit and bran to  your diet.  Drink more fluids.  Take your temperature twice a day and record it.   Do not drink alcohol while taking pain medicine.   Try to have someone home with you for the first 1-2 weeks to help with your household activities.   Follow up with your health care provider as directed. SEEK MEDICAL CARE IF:  You have a fever.   You feel sick to your stomach (nauseous) and throw up (vomit).   You have redness, swelling, or leakage of fluid at the incision site.   You have pain when you urinate or have blood in your urine.   You have a rash on your body.   You have pain or redness where the IV tube was inserted.   You have pain that is not relieved with medicine.  SEEK IMMEDIATE MEDICAL CARE IF:  You have chest pain or shortness of breath.   You feel dizzy or lightheaded.   You have increasing abdominal pain that is not relieved with medicines.   You have pain, swelling, or redness in your leg.   You see a yellowish white fluid (pus) coming from the incision.   Your incision is opening (edges not staying together).    This information is not intended to replace advice given to you by your health care provider. Make sure you discuss any questions you have with your health care provider.   Document Released: 03/28/2013 Document Reviewed: 03/28/2013 Elsevier Interactive Patient Education 2016 [place] Anesthesia, Adult, Care After Refer to this sheet in the next few weeks. These instructions provide you with information on caring for yourself after your procedure. Your health care provider may also give you more specific instructions. Your treatment has been planned according to current medical practices, but problems sometimes occur. Call your health care provider if you have any problems or questions after your procedure. WHAT TO EXPECT AFTER THE PROCEDURE After the procedure, it is typical to experience:  Sleepiness.  Nausea and  vomiting. HOME CARE INSTRUCTIONS  For the first 24 hours after general anesthesia:  Have a responsible person with you.  Do not drive a car. If you are alone, do not take public transportation.  Do not drink alcohol.  Do not take medicine that has not been prescribed by your health care provider.  Do not sign important papers or make important decisions.  You may resume a normal diet and activities as directed by your health care provider.  Change bandages (dressings) as directed.  If you have questions or problems that seem related to general anesthesia, call the hospital and ask for the anesthetist or anesthesiologist on call. SEEK MEDICAL CARE IF:  You have  nausea and vomiting that continue the day after anesthesia.  You develop a rash. SEEK IMMEDIATE MEDICAL CARE IF:   You have difficulty breathing.  You have chest pain.  You have any allergic problems.   This information is not intended to replace advice given to you by your health care provider. Make sure you discuss any questions you have with your health care provider.   Document Released: 09/13/2000 Document Revised: 06/28/2014 Document Reviewed: 10/06/2011 Elsevier Interactive Patient Education [employer].

## 2016-03-02 NOTE — Progress Notes (Signed)
Preoperative History and Physical  Teresa Bright is a 30 y.o. G1P1001 with Patient's last menstrual period was 01/29/2016 (exact date). admitted for a laparoscopic removal of right ovarian cyst, possible removal of right ovary.  Diagnosed at time of pregnancy, was a bit smaller now larger and causing increasing pain, now 10+ cm  PMH:    Past Medical History:  Diagnosis Date  . GERD (gastroesophageal reflux disease)   . Pregnant 08/20/2014    PSH:     Past Surgical History:  Procedure Laterality Date  . WISDOM TOOTH EXTRACTION      POb/GynH:      OB History    Gravida Para Term Preterm AB Living   1 1 1     1    SAB TAB Ectopic Multiple Live Births         0 1      SH:   Social History  Substance Use Topics  . Smoking status: Never Smoker  . Smokeless tobacco: Never Used  . Alcohol use No     Comment: occassionally prior to pregnancy    FH:    Family History  Problem Relation Age of Onset  . Cancer Maternal Grandmother     skin  . Diabetes Maternal Grandmother   . Stroke Maternal Grandfather   . Aneurysm Maternal Grandfather     brain  . Cancer Paternal Grandmother     bone and breast     Allergies: No Known Allergies  Medications:       Current Outpatient Prescriptions:  .  Norethindrone-Ethinyl Estradiol-Fe Biphas (LO LOESTRIN FE) 1 MG-10 MCG / 10 MCG tablet, Take 1 tablet by mouth daily., Disp: 3 Package, Rfl: 3 .  ibuprofen (ADVIL,MOTRIN) 600 MG tablet, Take 1 tablet (600 mg total) by mouth every 6 (six) hours as needed for mild pain, moderate pain or cramping. (Patient not taking: Reported on 12/02/2015), Disp: 30 tablet, Rfl: 0  Review of Systems:   Review of Systems  Constitutional: Negative for fever, chills, weight loss, malaise/fatigue and diaphoresis.  HENT: Negative for hearing loss, ear pain, nosebleeds, congestion, sore throat, neck pain, tinnitus and ear discharge.   Eyes: Negative for blurred vision, double vision, photophobia, pain, discharge  and redness.  Respiratory: Negative for cough, hemoptysis, sputum production, shortness of breath, wheezing and stridor.   Cardiovascular: Negative for chest pain, palpitations, orthopnea, claudication, leg swelling and PND.  Gastrointestinal: Positive for abdominal pain. Negative for heartburn, nausea, vomiting, diarrhea, constipation, blood in stool and melena.  Genitourinary: Negative for dysuria, urgency, frequency, hematuria and flank pain.  Musculoskeletal: Negative for myalgias, back pain, joint pain and falls.  Skin: Negative for itching and rash.  Neurological: Negative for dizziness, tingling, tremors, sensory change, speech change, focal weakness, seizures, loss of consciousness, weakness and headaches.  Endo/Heme/Allergies: Negative for environmental allergies and polydipsia. Does not bruise/bleed easily.  Psychiatric/Behavioral: Negative for depression, suicidal ideas, hallucinations, memory loss and substance abuse. The patient is not nervous/anxious and does not have insomnia.      PHYSICAL EXAM:  Blood pressure 110/80, pulse 78, height 5\' 4"  (1.626 m), weight 198 lb (89.8 kg), last menstrual period 01/29/2016, not currently breastfeeding.    Vitals reviewed. Constitutional: She is oriented to person, place, and time. She appears well-developed and well-nourished.  HENT:  Head: Normocephalic and atraumatic.  Right Ear: External ear normal.  Left Ear: External ear normal.  Nose: Nose normal.  Mouth/Throat: Oropharynx is clear and moist.  Eyes: Conjunctivae and EOM are normal. Pupils  are equal, round, and reactive to light. Right eye exhibits no discharge. Left eye exhibits no discharge. No scleral icterus.  Neck: Normal range of motion. Neck supple. No tracheal deviation present. No thyromegaly present.  Cardiovascular: Normal rate, regular rhythm, normal heart sounds and intact distal pulses.  Exam reveals no gallop and no friction rub.   No murmur heard. Respiratory:  Effort normal and breath sounds normal. No respiratory distress. She has no wheezes. She has no rales. She exhibits no tenderness.  GI: Soft. Bowel sounds are normal. She exhibits no distension and no mass. There is tenderness. There is no rebound and no guarding.  Genitourinary:       Vulva is normal without lesions Vagina is pink moist without discharge Cervix normal in appearance and pap is normal Uterus is normal size, contour, position, consistency, mobility, non-tender Adnexa per sonogram  Musculoskeletal: Normal range of motion. She exhibits no edema and no tenderness.  Neurological: She is alert and oriented to person, place, and time. She has normal reflexes. She displays normal reflexes. No cranial nerve deficit. She exhibits normal muscle tone. Coordination normal.  Skin: Skin is warm and dry. No rash noted. No erythema. No pallor.  Psychiatric: She has a normal mood and affect. Her behavior is normal. Judgment and thought content normal.    Labs: Results for orders placed or performed during the hospital encounter of 02/27/16 (from the past 336 hour(s))  CBC   Collection Time: 02/27/16  8:29 AM  Result Value Ref Range   WBC 5.8 4.0 - 10.5 K/uL   RBC 4.58 3.87 - 5.11 MIL/uL   Hemoglobin 13.5 12.0 - 15.0 g/dL   HCT 40.2 36.0 - 46.0 %   MCV 87.8 78.0 - 100.0 fL   MCH 29.5 26.0 - 34.0 pg   MCHC 33.6 30.0 - 36.0 g/dL   RDW 12.5 11.5 - 15.5 %   Platelets 297 150 - 400 K/uL  Comprehensive metabolic panel   Collection Time: 02/27/16  8:29 AM  Result Value Ref Range   Sodium 139 135 - 145 mmol/L   Potassium 4.1 3.5 - 5.1 mmol/L   Chloride 109 101 - 111 mmol/L   CO2 16 (L) 22 - 32 mmol/L   Glucose, Bld 88 65 - 99 mg/dL   BUN QUANTITY NOT SUFFICIENT, UNABLE TO PERFORM TEST 6 - 20 mg/dL   Creatinine, Ser QUANTITY NOT SUFFICIENT, UNABLE TO PERFORM TEST 0.44 - 1.00 mg/dL   Calcium 9.0 8.9 - 10.3 mg/dL   Total Protein QUANTITY NOT SUFFICIENT, UNABLE TO PERFORM TEST 6.5 - 8.1 g/dL    Albumin 4.1 3.5 - 5.0 g/dL   AST 22 15 - 41 U/L   ALT 13 (L) 14 - 54 U/L   Alkaline Phosphatase 52 38 - 126 U/L   Total Bilirubin 0.2 (L) 0.3 - 1.2 mg/dL   GFR calc non Af Amer NOT CALCULATED >60 mL/min   GFR calc Af Amer NOT CALCULATED >60 mL/min   Anion gap 14 5 - 15  hCG, quantitative, pregnancy   Collection Time: 02/27/16  8:29 AM  Result Value Ref Range   hCG, Beta Chain, Quant, S <1 <5 mIU/mL  Urinalysis, Routine w reflex microscopic (not at [facility])   Collection Time: 02/27/16  8:29 AM  Result Value Ref Range   Color, Urine RED (A) YELLOW   APPearance CLOUDY (A) CLEAR   Specific Gravity, Urine 1.025 1.005 - 1.030   pH 5.5 5.0 - 8.0   Glucose, UA NEGATIVE  NEGATIVE mg/dL   Hgb urine dipstick LARGE (A) NEGATIVE   Bilirubin Urine SMALL (A) NEGATIVE   Ketones, ur NEGATIVE NEGATIVE mg/dL   Protein, ur 100 (A) NEGATIVE mg/dL   Nitrite NEGATIVE NEGATIVE   Leukocytes, UA TRACE (A) NEGATIVE  Urine microscopic-add on   Collection Time: 02/27/16  8:29 AM  Result Value Ref Range   Squamous Epithelial / LPF 6-30 (A) NONE SEEN   WBC, UA 6-30 0 - 5 WBC/hpf   RBC / HPF TOO NUMEROUS TO COUNT 0 - 5 RBC/hpf   Bacteria, UA MANY (A) NONE SEEN    EKG: No orders found for this or any previous visit.  Imaging Studies: US Transvaginal Non-ob  Result Date: 02/16/2016 GYNECOLOGIC SONOGRAM Aanvi Yance is a 30 y.o. G1P1001 LMP 01/29/2016 for a pelvic sonogram to f/u right ovarian cyst. Uterus                      8.3 x 5.5 x 4.9 cm, homogeneous anteverted uterus,wnl Endometrium          4.1 mm, symmetrical, wnl Right ovary             10.4 x 7.4 x 9..2cm,simple right ov cyst 10.4 x 6.6 x 8.2 cm Left ovary                3.4 x 2.9 x 2.7 cm,simple lt ov cyst 2.6 x 2.7 x 2.3 cm Arterial and venous flow notes in both ov's,no free fluid seen,no pain during ultrasound Technician Comments: PELVIC US TA/TV W/DOPPLER: Homogeneous anteverted uterus,simple right ov cyst 10.4 x 6.6 x 8.2 cm,simple lt ov cyst  2.6 x 2.7 x 2.3 cm, arterial and venous flow notes in both ov's,no free fluid seen,no pain during ultrasound [employer] 02/02/2016 9:16 AM Clinical Impression and recommendations: I have reviewed the sonogram results above. Combined with the patient's current clinical course, below are my impressions and any appropriate recommendations for management based on the sonographic findings: 1. Persistent simple cyst right ovary now minimally and large compared to ultrasound  March 2017 with CA 125  also normal, without internal solid components or excrescences. Generally benign characteristics 2. Small simple left ovarian cyst appears functional in nature 3. Small uterus anteverted without distinct abnormality, thin endometrium Surgical consultation in place FERGUSON,JOHN V   US Pelvis Complete  Result Date: 02/16/2016 GYNECOLOGIC SONOGRAM Felecity Stade is a 30 y.o. G1P1001 LMP 01/29/2016 for a pelvic sonogram to f/u right ovarian cyst. Uterus                      8.3 x 5.5 x 4.9 cm, homogeneous anteverted uterus,wnl Endometrium          4.1 mm, symmetrical, wnl Right ovary             10.4 x 7.4 x 9..2cm,simple right ov cyst 10.4 x 6.6 x 8.2 cm Left ovary                3.4 x 2.9 x 2.7 cm,simple lt ov cyst 2.6 x 2.7 x 2.3 cm Arterial and venous flow notes in both ov's,no free fluid seen,no pain during ultrasound Technician Comments: PELVIC US TA/TV W/DOPPLER: Homogeneous anteverted uterus,simple right ov cyst 10.4 x 6.6 x 8.2 cm,simple lt ov cyst 2.6 x 2.7 x 2.3 cm, arterial and venous flow notes in both ov's,no free fluid seen,no pain during ultrasound [employer] 02/02/2016 9:16 AM Clinical Impression and recommendations: I  have reviewed the sonogram results above. Combined with the patient's current clinical course, below are my impressions and any appropriate recommendations for management based on the sonographic findings: 1. Persistent simple cyst right ovary now minimally and large compared to ultrasound   March 2017 with CA 125  also normal, without internal solid components or excrescences. Generally benign characteristics 2. Small simple left ovarian cyst appears functional in nature 3. Small uterus anteverted without distinct abnormality, thin endometrium Surgical consultation in place FERGUSON,JOHN V   [place] Art/ven Flow Abd Pelv Doppler Limited  Result Date: 02/16/2016 GYNECOLOGIC SONOGRAM Kiernan Dottery is a 30 y.o. G1P1001 LMP 01/29/2016 for a pelvic sonogram to f/u right ovarian cyst. Uterus                      8.3 x 5.5 x 4.9 cm, homogeneous anteverted uterus,wnl Endometrium          4.1 mm, symmetrical, wnl Right ovary             10.4 x 7.4 x 9..2cm,simple right ov cyst 10.4 x 6.6 x 8.2 cm Left ovary                3.4 x 2.9 x 2.7 cm,simple lt ov cyst 2.6 x 2.7 x 2.3 cm Arterial and venous flow notes in both ov's,no free fluid seen,no pain during ultrasound Technician Comments: PELVIC US TA/TV W/DOPPLER: Homogeneous anteverted uterus,simple right ov cyst 10.4 x 6.6 x 8.2 cm,simple lt ov cyst 2.6 x 2.7 x 2.3 cm, arterial and venous flow notes in both ov's,no free fluid seen,no pain during ultrasound [employer] 02/02/2016 9:16 AM Clinical Impression and recommendations: I have reviewed the sonogram results above. Combined with the patient's current clinical course, below are my impressions and any appropriate recommendations for management based on the sonographic findings: 1. Persistent simple cyst right ovary now minimally and large compared to ultrasound  March 2017 with CA 125  also normal, without internal solid components or excrescences. Generally benign characteristics 2. Small simple left ovarian cyst appears functional in nature 3. Small uterus anteverted without distinct abnormality, thin endometrium Surgical consultation in place FERGUSON,JOHN V      Assessment: Patient Active Problem List   Diagnosis Date Noted  . Ovarian cyst, right 10/03/2014    Plan: Laparoscopic management of  right ovarian cyst, cystectomy vs oophorectomy  EURE,LUTHER H 03/02/2016 10:15 PM

## 2016-03-03 ENCOUNTER — Ambulatory Visit (HOSPITAL_COMMUNITY): Payer: Managed Care, Other (non HMO) | Admitting: Anesthesiology

## 2016-03-03 ENCOUNTER — Ambulatory Visit (HOSPITAL_COMMUNITY)
Admission: RE | Admit: 2016-03-03 | Discharge: 2016-03-03 | Disposition: A | Discharge status: Home / Self Care | Payer: Managed Care, Other (non HMO) | Source: Ambulatory Visit | Attending: Obstetrics & Gynecology | Admitting: Obstetrics & Gynecology

## 2016-03-03 ENCOUNTER — Encounter (HOSPITAL_COMMUNITY)
Admission: RE | Disposition: A | Discharge status: Home / Self Care | Payer: Self-pay | Source: Ambulatory Visit | Attending: Obstetrics & Gynecology

## 2016-03-03 ENCOUNTER — Encounter (HOSPITAL_COMMUNITY): Payer: Self-pay

## 2016-03-03 DIAGNOSIS — D270 Benign neoplasm of right ovary: Secondary | ICD-10-CM | POA: Insufficient documentation

## 2016-03-03 DIAGNOSIS — N83201 Unspecified ovarian cyst, right side: Secondary | ICD-10-CM | POA: Diagnosis present

## 2016-03-03 DIAGNOSIS — Z793 Long term (current) use of hormonal contraceptives: Secondary | ICD-10-CM | POA: Insufficient documentation

## 2016-03-03 HISTORY — PX: LAPAROSCOPIC OVARIAN CYSTECTOMY: SHX6248

## 2016-03-03 SURGERY — EXCISION, CYST, OVARY, LAPAROSCOPIC
Anesthesia: General | Laterality: Right

## 2016-03-03 MED ORDER — CEFAZOLIN SODIUM-DEXTROSE 2-4 GM/100ML-% IV SOLN
2.0000 g | INTRAVENOUS | Status: AC
  Administered 2016-03-03: 2 g via INTRAVENOUS

## 2016-03-03 MED ORDER — 0.9 % SODIUM CHLORIDE (POUR BTL) OPTIME
TOPICAL | Status: DC | PRN
  Administered 2016-03-03: 1000 mL
  Administered 2016-03-03: 3000 mL

## 2016-03-03 MED ORDER — OXYCODONE-ACETAMINOPHEN 5-325 MG PO TABS: 1.0000 | ORAL_TABLET | ORAL | 0 refills | Status: DC | PRN

## 2016-03-03 MED ORDER — FENTANYL CITRATE (PF) 250 MCG/5ML IJ SOLN
INTRAMUSCULAR | Status: AC
  Filled 2016-03-03: qty 5

## 2016-03-03 MED ORDER — ROCURONIUM BROMIDE 50 MG/5ML IV SOLN
INTRAVENOUS | Status: AC
Start: 2016-03-03 — End: 2016-03-03
  Filled 2016-03-03: qty 1

## 2016-03-03 MED ORDER — PROPOFOL 10 MG/ML IV BOLUS
INTRAVENOUS | Status: DC | PRN
  Administered 2016-03-03: 160 mg via INTRAVENOUS

## 2016-03-03 MED ORDER — ONDANSETRON HCL 8 MG PO TABS: 8.0000 mg | ORAL_TABLET | Freq: Three times a day (TID) | ORAL | 0 refills | Status: DC | PRN

## 2016-03-03 MED ORDER — LACTATED RINGERS IV SOLN
INTRAVENOUS | Status: DC
  Administered 2016-03-03 (×2): via INTRAVENOUS

## 2016-03-03 MED ORDER — DEXAMETHASONE SODIUM PHOSPHATE 4 MG/ML IJ SOLN
4.0000 mg | Freq: Once | INTRAMUSCULAR | Status: AC
  Administered 2016-03-03: 4 mg via INTRAVENOUS
  Filled 2016-03-03: qty 1

## 2016-03-03 MED ORDER — KETOROLAC TROMETHAMINE 30 MG/ML IJ SOLN
30.0000 mg | Freq: Once | INTRAMUSCULAR | Status: AC
  Administered 2016-03-03: 30 mg via INTRAVENOUS

## 2016-03-03 MED ORDER — MIDAZOLAM HCL 2 MG/2ML IJ SOLN
1.0000 mg | INTRAMUSCULAR | Status: DC | PRN
  Administered 2016-03-03: 2 mg via INTRAVENOUS
  Filled 2016-03-03: qty 2

## 2016-03-03 MED ORDER — HYDROMORPHONE HCL 1 MG/ML IJ SOLN: 0.2500 mg | INTRAMUSCULAR | Status: DC | PRN

## 2016-03-03 MED ORDER — BUPIVACAINE LIPOSOME 1.3 % IJ SUSP
20.0000 mL | Freq: Once | INTRAMUSCULAR | Status: DC
  Filled 2016-03-03: qty 20

## 2016-03-03 MED ORDER — LIDOCAINE HCL (CARDIAC) 10 MG/ML IV SOLN
INTRAVENOUS | Status: DC | PRN
Start: 2016-03-03 — End: 2016-03-03
  Administered 2016-03-03: 40 mg via INTRAVENOUS

## 2016-03-03 MED ORDER — SUCCINYLCHOLINE CHLORIDE 20 MG/ML IJ SOLN
INTRAMUSCULAR | Status: AC
  Filled 2016-03-03: qty 1

## 2016-03-03 MED ORDER — BUPIVACAINE LIPOSOME 1.3 % IJ SUSP
INTRAMUSCULAR | Status: DC | PRN
  Administered 2016-03-03: 20 mL

## 2016-03-03 MED ORDER — NEOSTIGMINE METHYLSULFATE 10 MG/10ML IV SOLN
INTRAVENOUS | Status: DC | PRN
  Administered 2016-03-03: 3.5 mg via INTRAVENOUS

## 2016-03-03 MED ORDER — KETOROLAC TROMETHAMINE 10 MG PO TABS: 10.0000 mg | ORAL_TABLET | Freq: Three times a day (TID) | ORAL | 0 refills | Status: DC | PRN

## 2016-03-03 MED ORDER — HEMOSTATIC AGENTS (NO CHARGE) OPTIME
TOPICAL | Status: DC | PRN
  Administered 2016-03-03: 1 via TOPICAL

## 2016-03-03 MED ORDER — FENTANYL CITRATE (PF) 100 MCG/2ML IJ SOLN
INTRAMUSCULAR | Status: DC | PRN
  Administered 2016-03-03 (×4): 50 ug via INTRAVENOUS

## 2016-03-03 MED ORDER — ONDANSETRON HCL 4 MG/2ML IJ SOLN
4.0000 mg | Freq: Once | INTRAMUSCULAR | Status: AC
  Administered 2016-03-03: 4 mg via INTRAVENOUS
  Filled 2016-03-03: qty 2

## 2016-03-03 MED ORDER — FENTANYL CITRATE (PF) 100 MCG/2ML IJ SOLN
INTRAMUSCULAR | Status: AC
  Filled 2016-03-03: qty 2

## 2016-03-03 MED ORDER — ROCURONIUM 10MG/ML (10ML) SYRINGE FOR MEDFUSION PUMP - OPTIME
INTRAVENOUS | Status: DC | PRN
  Administered 2016-03-03 (×3): 5 mg via INTRAVENOUS
  Administered 2016-03-03: 10 mg via INTRAVENOUS
  Administered 2016-03-03: 30 mg via INTRAVENOUS
  Administered 2016-03-03: 5 mg via INTRAVENOUS

## 2016-03-03 MED ORDER — GLYCOPYRROLATE 0.2 MG/ML IJ SOLN
INTRAMUSCULAR | Status: DC | PRN
  Administered 2016-03-03: .6 mg via INTRAVENOUS

## 2016-03-03 MED ORDER — CEFAZOLIN SODIUM-DEXTROSE 2-4 GM/100ML-% IV SOLN
INTRAVENOUS | Status: AC
  Filled 2016-03-03: qty 100

## 2016-03-03 MED ORDER — KETOROLAC TROMETHAMINE 30 MG/ML IJ SOLN
INTRAMUSCULAR | Status: AC
  Filled 2016-03-03: qty 1

## 2016-03-03 MED ORDER — BUPIVACAINE LIPOSOME 1.3 % IJ SUSP
INTRAMUSCULAR | Status: AC
  Filled 2016-03-03: qty 20

## 2016-03-03 MED ORDER — LIDOCAINE HCL (PF) 1 % IJ SOLN
INTRAMUSCULAR | Status: AC
  Filled 2016-03-03: qty 5

## 2016-03-03 MED ORDER — PROPOFOL 10 MG/ML IV BOLUS
INTRAVENOUS | Status: AC
  Filled 2016-03-03: qty 20

## 2016-03-03 SURGICAL SUPPLY — 49 items
APPLICATOR ARISTA FLEXITIP XL (MISCELLANEOUS) ×3 IMPLANT
APPLIER CLIP LAPSCP 10X32 DD (CLIP) ×3 IMPLANT
BAG HAMPER (MISCELLANEOUS) ×3 IMPLANT
BLADE SURG SZ11 CARB STEEL (BLADE) ×3 IMPLANT
CLOTH BEACON ORANGE TIMEOUT ST (SAFETY) ×3 IMPLANT
COVER LIGHT HANDLE STERIS (MISCELLANEOUS) ×6 IMPLANT
ELECT REM PT RETURN 9FT ADLT (ELECTROSURGICAL) ×3
ELECTRODE REM PT RTRN 9FT ADLT (ELECTROSURGICAL) ×1 IMPLANT
FILTER SMOKE EVAC LAPAROSHD (FILTER) ×3 IMPLANT
FORMALIN 10 PREFIL 480ML (MISCELLANEOUS) ×3 IMPLANT
GLOVE BIOGEL PI IND STRL 7.0 (GLOVE) ×2 IMPLANT
GLOVE BIOGEL PI IND STRL 7.5 (GLOVE) ×1 IMPLANT
GLOVE BIOGEL PI IND STRL 8 (GLOVE) ×3 IMPLANT
GLOVE BIOGEL PI INDICATOR 7.0 (GLOVE) ×4
GLOVE BIOGEL PI INDICATOR 7.5 (GLOVE) ×2
GLOVE BIOGEL PI INDICATOR 8 (GLOVE) ×6
GLOVE ECLIPSE 6.5 STRL STRAW (GLOVE) ×3 IMPLANT
GLOVE ECLIPSE 8.0 STRL XLNG CF (GLOVE) ×3 IMPLANT
GOWN L4 XXLG W/PAP TWL (GOWN DISPOSABLE) ×6 IMPLANT
GOWN STRL REUS W/TWL LRG LVL3 (GOWN DISPOSABLE) ×6 IMPLANT
GOWN STRL REUS W/TWL XL LVL3 (GOWN DISPOSABLE) ×3 IMPLANT
HEMOSTAT ARISTA ABSORB 3G PWDR (MISCELLANEOUS) ×3 IMPLANT
INST SET LAPROSCOPIC GYN AP (KITS) ×3 IMPLANT
IV NS IRRIG 3000ML ARTHROMATIC (IV SOLUTION) ×3 IMPLANT
KIT ROOM TURNOVER APOR (KITS) ×3 IMPLANT
MANIFOLD NEPTUNE II (INSTRUMENTS) ×3 IMPLANT
NEEDLE HYPO 18GX1.5 BLUNT FILL (NEEDLE) ×3 IMPLANT
NEEDLE INSUFFLATION 120MM (ENDOMECHANICALS) ×3 IMPLANT
NS IRRIG 1000ML POUR BTL (IV SOLUTION) ×3 IMPLANT
PACK PERI GYN (CUSTOM PROCEDURE TRAY) ×3 IMPLANT
PAD ARMBOARD 7.5X6 YLW CONV (MISCELLANEOUS) ×3 IMPLANT
POUCH SPECIMEN RETRIEVAL 10MM (ENDOMECHANICALS) ×3 IMPLANT
SET BASIN LINEN APH (SET/KITS/TRAYS/PACK) ×3 IMPLANT
SET TUBE IRRIG SUCTION NO TIP (IRRIGATION / IRRIGATOR) ×3 IMPLANT
SHEARS HARMONIC ACE PLUS 36CM (ENDOMECHANICALS) ×3 IMPLANT
SLEEVE ENDOPATH XCEL 5M (ENDOMECHANICALS) ×3 IMPLANT
SOLUTION ANTI FOG 6CC (MISCELLANEOUS) ×3 IMPLANT
SPONGE GAUZE 2X2 8PLY STER LF (GAUZE/BANDAGES/DRESSINGS) ×3
SPONGE GAUZE 2X2 8PLY STRL LF (GAUZE/BANDAGES/DRESSINGS) ×6 IMPLANT
STAPLER VISISTAT 35W (STAPLE) ×3 IMPLANT
SUT VICRYL 0 UR6 27IN ABS (SUTURE) ×3 IMPLANT
SYR 20CC LL (SYRINGE) ×3 IMPLANT
SYRINGE 10CC LL (SYRINGE) ×3 IMPLANT
TAPE CLOTH SURG 4X10 WHT LF (GAUZE/BANDAGES/DRESSINGS) ×3 IMPLANT
TRAY FOLEY CATH SILVER 16FR (SET/KITS/TRAYS/PACK) ×3 IMPLANT
TROCAR ENDO BLADELESS 11MM (ENDOMECHANICALS) ×3 IMPLANT
TROCAR XCEL NON-BLD 5MMX100MML (ENDOMECHANICALS) ×3 IMPLANT
TUBING INSUFFLATION (TUBING) ×3 IMPLANT
WARMER LAPAROSCOPE (MISCELLANEOUS) ×3 IMPLANT

## 2016-03-03 NOTE — Progress Notes (Signed)
Awake. Denies pain. Refuses po fluids. 

## 2016-03-03 NOTE — Op Note (Signed)
Preoperative diagnosis:  1.  Large cystic persistent mass of the right ovary                                         2.  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 Endotracheal  Findings:  T the patient had a right ovarian cyst which was quite large discovered at the time of her pregnancy.  The largest measure was 12 cm.  She was asymptomatic with that and we did not intervene during the pregnancy because of certainly the complications could be involved with that.  Had the it had the appearance of a benign ovarian cyst.  Subsequently we did CA 125 which was normal performed a repeat sonogram and it revealed that it was somewhat smaller.  Recently we did another sonogram which showed that it once again enlarged.  As a result we proceeded with a laparoscopic evaluation surgical management the day for a probable benign ovarian neoplasm Intraoperatively,  the right ovarian was simple with no loculations at all.  The fluid in it was clear there were no excrescences on the surfaces and the peritoneum was completely normal.  Additionally the left ovary was normal.  Otherwise both tubes and the uterus were normal  Description of operation:  The patient was taken to the operating room and placed in the supine position where she underwent general endotracheal anesthesia.  She was placed in the low lithotomy position.  She was prepped and draped in the usual sterile fashion and a Foley catheter was placed.  An incision was made in the umbilicus and a Veres needle was placed into the peritoneal cavity with one pass without difficulty.  The peritoneal cavity was then insufflated.  An 11 mm non-bladed direct visualization trocar was then used and placed into the peritoneal cavity with direct laparoscopic  visualization again without difficulty.  Incisions were then made in the left lower quadrant and right lower quadrant.  A 5 mm trocar was placed in the left lower quadrant and a 5 mm trocar was placed in the right lower quadrant.  Both were done under direct visualization without difficulty.  The right ovary was grasped.   The harmonic scalpel was used and an incision was made on the surface of the ovary.   Almost immediately there was a disruption of the cystic wall .  The cyst was densely adherent to the ovary and was very difficult to remove I used hair Jake Bathe technique as well as high flow water dissection.  Painstakingly remove the entire cyst.  Liver there was a good amount of bleeding from the bed of the cyst and the ovarian wall including where the ovarian blood supply was coming up I held pressure for a long time, and used Arista, all to no avail and ultimately had to remove the entire ovary with the harmonic scalpel.  There was good hemostasis   An Endo Catch was placed in the right ovary and cyst was put in the bag and removed through the umbilical incision without difficulty.  He left ovary was normal.  The subcutaneous tissue was then reapproximated with 0 Vicryl. The skin was closed using skin staples.  The umbilical fascia  was closed with a single 0 Vicryl suture.  The subcutaneous tissue was reapproximated loosely using 0 Vicryl.  The skin was closed using skin staples.  The patient was awakened from anesthesia and taken to the recovery room in good stable condition with all counts being correct x3.  The estimated blood loss for the procedure is 200 cc.The patient received Ancef preoperatively.  She also received Toradol IV preoperatively.  She will be discharged from the recovery room time and seen next week for staple and suture removal.  again the left ovary and both tubes were preserved  Teresa Bright H

## 2016-03-03 NOTE — Anesthesia Procedure Notes (Signed)
Procedure Name: Intubation Date/Time: 03/03/2016 8:47 AM Performed by: Tressie Stalker E Pre-anesthesia Checklist: Patient identified, Patient being monitored, Timeout performed, Emergency Drugs available and Suction available Patient Re-evaluated:Patient Re-evaluated prior to inductionOxygen Delivery Method: Circle system utilized Preoxygenation: Pre-oxygenation with 100% oxygen Intubation Type: IV induction Ventilation: Mask ventilation without difficulty Laryngoscope Size: Mac and 3 Grade View: Grade I Tube type: Oral Tube size: 7.0 mm Number of attempts: 1 Airway Equipment and Method: Stylet Placement Confirmation: ETT inserted through vocal cords under direct vision,  positive ETCO2 and breath sounds checked- equal and bilateral Secured at: 22 cm Tube secured with: Tape Dental Injury: Teeth and Oropharynx as per pre-operative assessment

## 2016-03-03 NOTE — Discharge Instructions (Signed)
Ovarian Cystectomy, Care After Refer to this sheet in the next few weeks. These instructions provide you with information on caring for yourself after your procedure. Your health care provider may also give you more specific instructions. Your treatment has been planned according to current medical practices, but problems sometimes occur. Call your health care provider if you have any problems or questions after your procedure.  WHAT TO EXPECT AFTER THE PROCEDURE After your procedure, it is typical to have the following:  Pain in your abdomen, especially at the incision sites. You will be given pain medicines to control the pain.  Tiredness. This is a normal part of the recovery process. Your energy level will return to normal over the next several weeks.  Constipation. HOME CARE INSTRUCTIONS  Only take over-the-counter or prescription medicines as directed by your health care provider. Avoid taking aspirin because it can cause bleeding.   Follow your health care provider's instructions for when to resume your regular diet, exercise, and activities.   Take rest breaks during the day as needed.  Do not douche or have sexual intercourse until you have permission from your health care provider.   Remove or change any bandages (dressings) as directed by your health care provider.   Do not drive until your health care provider approves.   Take showers instead of baths until your health care provider tells you otherwise.   If you become constipated, you may:   Use a mild laxative if your health care provider approves.  Add more fruit and bran to your diet.  Drink more fluids.  Take your temperature twice a day and record it.   Do not drink alcohol while taking pain medicine.   Try to have someone home with you for the first 1-2 weeks to help with your household activities.   Follow up with your health care provider as directed. SEEK MEDICAL CARE IF:  You have a fever.    You feel sick to your stomach (nauseous) and throw up (vomit).   You have redness, swelling, or leakage of fluid at the incision site.   You have pain when you urinate or have blood in your urine.   You have a rash on your body.   You have pain or redness where the IV tube was inserted.   You have pain that is not relieved with medicine.  SEEK IMMEDIATE MEDICAL CARE IF:  You have chest pain or shortness of breath.   You feel dizzy or lightheaded.   You have increasing abdominal pain that is not relieved with medicines.   You have pain, swelling, or redness in your leg.   You see a yellowish white fluid (pus) coming from the incision.   Your incision is opening (edges not staying together).    This information is not intended to replace advice given to you by your health care provider. Make sure you discuss any questions you have with your health care provider.   Document Released: 03/28/2013 Document Reviewed: 03/28/2013 Elsevier Interactive Patient Education 2016 [employer]  Unilateral Salpingo-Oophorectomy, Care After Refer to this sheet in the next few weeks. These instructions provide you with information on caring for yourself after your procedure. Your health care provider may also give you more specific instructions. Your treatment has been planned according to current medical practices, but problems sometimes occur. Call your health care provider if you have any problems or questions after your procedure. WHAT TO EXPECT AFTER THE PROCEDURE After your procedure, it  is typical to have the following:  Abdominal pain that can be controlled with pain medicine.  Vaginal spotting.  Constipation. HOME CARE INSTRUCTIONS   Get plenty of rest and sleep.  Only take over-the-counter or prescription medicines as directed by your health care provider. Do not take aspirin. It can cause bleeding.  Keep incision areas clean and dry. Remove or change any  bandages (dressings) only as directed by your health care provider.  Follow your health care provider's advice regarding diet.  Drink enough fluids to keep your urine clear or pale yellow.  Limit exercise and activities as directed by your health care provider. Do not lift anything heavier than 5 pounds (2.3 kg) until your health care provider approves.  Do not drive until your health care provider approves.  Do not drink alcohol until your health care provider approves.  Do not have sexual intercourse until your health care provider says it is OK.  Take your temperature twice a day and write it down.  If you become constipated, you may:  Ask your health care provider about taking a mild laxative.  Add more fruit and bran to your diet.  Drink more fluids.  Follow up with your health care provider as directed. SEEK MEDICAL CARE IF:   You have swelling or redness in the incision area.  You develop a rash.  You feel lightheaded.  You have pain that is not controlled with medicine.  You have pain, swelling, or redness where the IV access tube was placed. SEEK IMMEDIATE MEDICAL CARE IF:  You have a fever.  You develop increasing abdominal pain.  You see pus coming out of the incision, or the incision is separating.  You notice a bad smell coming from the wound or dressing.  You have excessive vaginal bleeding.  You feel sick to your stomach (nauseous) and vomit.  You have leg or chest pain.  You have pain when you urinate.  You develop shortness of breath.  You pass out.   This information is not intended to replace advice given to you by your health care provider. Make sure you discuss any questions you have with your health care provider.   Document Released: 04/03/2009 Document Revised: 03/28/2013 Document Reviewed: 11/29/2012 Elsevier Interactive Patient Education [employer].

## 2016-03-03 NOTE — Anesthesia Preprocedure Evaluation (Signed)
Anesthesia Evaluation  Patient identified by MRN, date of birth, ID band Patient awake    Reviewed: Allergy & Precautions, NPO status , Patient's Chart, lab work & pertinent test results  Airway Mallampati: II  TM Distance: >3 FB     Dental  (+) Teeth Intact   Pulmonary neg pulmonary ROS,    breath sounds clear to auscultation       Cardiovascular negative cardio ROS   Rhythm:Regular Rate:Normal     Neuro/Psych    GI/Hepatic GERD  Controlled,  Endo/Other    Renal/GU      Musculoskeletal   Abdominal   Peds  Hematology   Anesthesia Other Findings   Reproductive/Obstetrics                             Anesthesia Physical Anesthesia Plan  ASA: II  Anesthesia Plan: General   Post-op Pain Management:    Induction: Intravenous  Airway Management Planned: Oral ETT  Additional Equipment:   Intra-op Plan:   Post-operative Plan: Extubation in OR  Informed Consent: I have reviewed the patients History and Physical, chart, labs and discussed the procedure including the risks, benefits and alternatives for the proposed anesthesia with the patient or authorized representative who has indicated his/her understanding and acceptance.     Plan Discussed with:   Anesthesia Plan Comments:         Anesthesia Quick Evaluation

## 2016-03-03 NOTE — Anesthesia Postprocedure Evaluation (Signed)
Anesthesia Post Note  Patient: [name]  Procedure(s) Performed: Procedure(s) (LRB): LAPAROSCOPIC RIGHT OVARIAN CYSTECTOMY AND RIGHT OOPHORECTOMY (Right)  Patient location during evaluation: PACU Anesthesia Type: General Level of consciousness: awake and alert and oriented Pain management: pain level controlled Vital Signs Assessment: post-procedure vital signs reviewed and stable Respiratory status: spontaneous breathing Cardiovascular status: blood pressure returned to baseline Postop Assessment: no signs of nausea or vomiting Anesthetic complications: no    Last Vitals:  Vitals:   03/03/16 1110 03/03/16 1115  BP: 128/79 118/79  Pulse: 85 79  Resp: 20 (!) 22  Temp: 36.6 C     Last Pain:  Vitals:   03/03/16 0731  TempSrc: Oral                 Bernadette Gores

## 2016-03-03 NOTE — Interval H&P Note (Signed)
History and Physical Interval Note:  03/03/2016 7:59 AM  Teresa Bright  has presented today for surgery, with the diagnosis of right ovarian cyst  The various methods of treatment have been discussed with the patient and family. After consideration of risks, benefits and other options for treatment, the patient has consented to  Procedure(s) with comments: LAPAROSCOPIC OVARIAN CYSTECTOMY (Right) - possible removal of right tube and/or ovary as a surgical intervention .  The patient's history has been reviewed, patient examined, no change in status, stable for surgery.  I have reviewed the patient's chart and labs.  Questions were answered to the patient's satisfaction.     EURE,LUTHER H

## 2016-03-03 NOTE — H&P (Signed)
Preoperative History and Physical  Teresa Bright is a 30 y.o. G1P1001 with Patient's last menstrual period was 01/29/2016 (exact date). admitted for a laparoscopic removal of right ovarian cyst, possible removal of right ovary.  Diagnosed at time of pregnancy, was a bit smaller now larger and causing increasing pain, now 10+ cm  PMH:        Past Medical History:  Diagnosis Date  . GERD (gastroesophageal reflux disease)   . Pregnant 08/20/2014    PSH:          Past Surgical History:  Procedure Laterality Date  . WISDOM TOOTH EXTRACTION      POb/GynH:              OB History    Gravida Para Term Preterm AB Living   1 1 1     1    SAB TAB Ectopic Multiple Live Births         0 1      SH:         Social History  Substance Use Topics  . Smoking status: Never Smoker  . Smokeless tobacco: Never Used  . Alcohol use No     Comment: occassionally prior to pregnancy    FH:          Family History  Problem Relation Age of Onset  . Cancer Maternal Grandmother     skin  . Diabetes Maternal Grandmother   . Stroke Maternal Grandfather   . Aneurysm Maternal Grandfather     brain  . Cancer Paternal Grandmother     bone and breast     Allergies: No Known Allergies  Medications:       Current Outpatient Prescriptions:  .  Norethindrone-Ethinyl Estradiol-Fe Biphas (LO LOESTRIN FE) 1 MG-10 MCG / 10 MCG tablet, Take 1 tablet by mouth daily., Disp: 3 Package, Rfl: 3 .  ibuprofen (ADVIL,MOTRIN) 600 MG tablet, Take 1 tablet (600 mg total) by mouth every 6 (six) hours as needed for mild pain, moderate pain or cramping. (Patient not taking: Reported on 12/02/2015), Disp: 30 tablet, Rfl: 0  Review of Systems:   Review of Systems  Constitutional: Negative for fever, chills, weight loss, malaise/fatigue and diaphoresis.  HENT: Negative for hearing loss, ear pain, nosebleeds, congestion, sore throat, neck pain, tinnitus and ear discharge.    Eyes: Negative for blurred vision, double vision, photophobia, pain, discharge and redness.  Respiratory: Negative for cough, hemoptysis, sputum production, shortness of breath, wheezing and stridor.   Cardiovascular: Negative for chest pain, palpitations, orthopnea, claudication, leg swelling and PND.  Gastrointestinal: Positive for abdominal pain. Negative for heartburn, nausea, vomiting, diarrhea, constipation, blood in stool and melena.  Genitourinary: Negative for dysuria, urgency, frequency, hematuria and flank pain.  Musculoskeletal: Negative for myalgias, back pain, joint pain and falls.  Skin: Negative for itching and rash.  Neurological: Negative for dizziness, tingling, tremors, sensory change, speech change, focal weakness, seizures, loss of consciousness, weakness and headaches.  Endo/Heme/Allergies: Negative for environmental allergies and polydipsia. Does not bruise/bleed easily.  Psychiatric/Behavioral: Negative for depression, suicidal ideas, hallucinations, memory loss and substance abuse. The patient is not nervous/anxious and does not have insomnia.      PHYSICAL EXAM:  Blood pressure 110/80, pulse 78, height 5\' 4"  (1.626 m), weight 198 lb (89.8 kg), last menstrual period 01/29/2016, not currently breastfeeding.    Vitals reviewed. Constitutional: She is oriented to person, place, and time. She appears well-developed and well-nourished.  HENT:  Head: Normocephalic and atraumatic.  Right  Ear: External ear normal.  Left Ear: External ear normal.  Nose: Nose normal.  Mouth/Throat: Oropharynx is clear and moist.  Eyes: Conjunctivae and EOM are normal. Pupils are equal, round, and reactive to light. Right eye exhibits no discharge. Left eye exhibits no discharge. No scleral icterus.  Neck: Normal range of motion. Neck supple. No tracheal deviation present. No thyromegaly present.  Cardiovascular: Normal rate, regular rhythm, normal heart sounds and intact distal  pulses.  Exam reveals no gallop and no friction rub.   No murmur heard. Respiratory: Effort normal and breath sounds normal. No respiratory distress. She has no wheezes. She has no rales. She exhibits no tenderness.  GI: Soft. Bowel sounds are normal. She exhibits no distension and no mass. There is tenderness. There is no rebound and no guarding.  Genitourinary:       Vulva is normal without lesions Vagina is pink moist without discharge Cervix normal in appearance and pap is normal Uterus is normal size, contour, position, consistency, mobility, non-tender Adnexa per sonogram  Musculoskeletal: Normal range of motion. She exhibits no edema and no tenderness.  Neurological: She is alert and oriented to person, place, and time. She has normal reflexes. She displays normal reflexes. No cranial nerve deficit. She exhibits normal muscle tone. Coordination normal.  Skin: Skin is warm and dry. No rash noted. No erythema. No pallor.  Psychiatric: She has a normal mood and affect. Her behavior is normal. Judgment and thought content normal.    Labs:      Results for orders placed or performed during the hospital encounter of 02/27/16 (from the past 336 hour(s))  CBC   Collection Time: 02/27/16  8:29 AM  Result Value Ref Range   WBC 5.8 4.0 - 10.5 K/uL   RBC 4.58 3.87 - 5.11 MIL/uL   Hemoglobin 13.5 12.0 - 15.0 g/dL   HCT 40.2 36.0 - 46.0 %   MCV 87.8 78.0 - 100.0 fL   MCH 29.5 26.0 - 34.0 pg   MCHC 33.6 30.0 - 36.0 g/dL   RDW 12.5 11.5 - 15.5 %   Platelets 297 150 - 400 K/uL  Comprehensive metabolic panel   Collection Time: 02/27/16  8:29 AM  Result Value Ref Range   Sodium 139 135 - 145 mmol/L   Potassium 4.1 3.5 - 5.1 mmol/L   Chloride 109 101 - 111 mmol/L   CO2 16 (L) 22 - 32 mmol/L   Glucose, Bld 88 65 - 99 mg/dL   BUN QUANTITY NOT SUFFICIENT, UNABLE TO PERFORM TEST 6 - 20 mg/dL   Creatinine, Ser QUANTITY NOT SUFFICIENT, UNABLE TO PERFORM TEST 0.44 - 1.00 mg/dL    Calcium 9.0 8.9 - 10.3 mg/dL   Total Protein QUANTITY NOT SUFFICIENT, UNABLE TO PERFORM TEST 6.5 - 8.1 g/dL   Albumin 4.1 3.5 - 5.0 g/dL   AST 22 15 - 41 U/L   ALT 13 (L) 14 - 54 U/L   Alkaline Phosphatase 52 38 - 126 U/L   Total Bilirubin 0.2 (L) 0.3 - 1.2 mg/dL   GFR calc non Af Amer NOT CALCULATED >60 mL/min   GFR calc Af Amer NOT CALCULATED >60 mL/min   Anion gap 14 5 - 15  hCG, quantitative, pregnancy   Collection Time: 02/27/16  8:29 AM  Result Value Ref Range   hCG, Beta Chain, Quant, S <1 <5 mIU/mL  Urinalysis, Routine w reflex microscopic (not at [facility])   Collection Time: 02/27/16  8:29 AM  Result Value Ref Range  Color, Urine RED (A) YELLOW   APPearance CLOUDY (A) CLEAR   Specific Gravity, Urine 1.025 1.005 - 1.030   pH 5.5 5.0 - 8.0   Glucose, UA NEGATIVE NEGATIVE mg/dL   Hgb urine dipstick LARGE (A) NEGATIVE   Bilirubin Urine SMALL (A) NEGATIVE   Ketones, ur NEGATIVE NEGATIVE mg/dL   Protein, ur 100 (A) NEGATIVE mg/dL   Nitrite NEGATIVE NEGATIVE   Leukocytes, UA TRACE (A) NEGATIVE  Urine microscopic-add on   Collection Time: 02/27/16  8:29 AM  Result Value Ref Range   Squamous Epithelial / LPF 6-30 (A) NONE SEEN   WBC, UA 6-30 0 - 5 WBC/hpf   RBC / HPF TOO NUMEROUS TO COUNT 0 - 5 RBC/hpf   Bacteria, UA MANY (A) NONE SEEN    EKG: No orders found for this or any previous visit.  Imaging Studies:  ImagingResults  US Transvaginal Non-ob  Result Date: 02/16/2016 GYNECOLOGIC SONOGRAM Kalolaine Hiott is a 30 y.o. G1P1001 LMP 01/29/2016 for a pelvic sonogram to f/u right ovarian cyst. Uterus                      8.3 x 5.5 x 4.9 cm, homogeneous anteverted uterus,wnl Endometrium          4.1 mm, symmetrical, wnl Right ovary             10.4 x 7.4 x 9..2cm,simple right ov cyst 10.4 x 6.6 x 8.2 cm Left ovary                3.4 x 2.9 x 2.7 cm,simple lt ov cyst 2.6 x 2.7 x 2.3 cm Arterial and venous flow notes in both ov's,no free fluid  seen,no pain during ultrasound Technician Comments: PELVIC US TA/TV W/DOPPLER: Homogeneous anteverted uterus,simple right ov cyst 10.4 x 6.6 x 8.2 cm,simple lt ov cyst 2.6 x 2.7 x 2.3 cm, arterial and venous flow notes in both ov's,no free fluid seen,no pain during ultrasound [employer] 02/02/2016 9:16 AM Clinical Impression and recommendations: I have reviewed the sonogram results above. Combined with the patient's current clinical course, below are my impressions and any appropriate recommendations for management based on the sonographic findings: 1. Persistent simple cyst right ovary now minimally and large compared to ultrasound  March 2017 with CA 125  also normal, without internal solid components or excrescences. Generally benign characteristics 2. Small simple left ovarian cyst appears functional in nature 3. Small uterus anteverted without distinct abnormality, thin endometrium Surgical consultation in place FERGUSON,JOHN V   US Pelvis Complete  Result Date: 02/16/2016 GYNECOLOGIC SONOGRAM Tamyka Lubold is a 30 y.o. G1P1001 LMP 01/29/2016 for a pelvic sonogram to f/u right ovarian cyst. Uterus                      8.3 x 5.5 x 4.9 cm, homogeneous anteverted uterus,wnl Endometrium          4.1 mm, symmetrical, wnl Right ovary             10.4 x 7.4 x 9..2cm,simple right ov cyst 10.4 x 6.6 x 8.2 cm Left ovary                3.4 x 2.9 x 2.7 cm,simple lt ov cyst 2.6 x 2.7 x 2.3 cm Arterial and venous flow notes in both ov's,no free fluid seen,no pain during ultrasound Technician Comments: PELVIC US TA/TV W/DOPPLER: Homogeneous anteverted uterus,simple right ov cyst 10.4 x 6.6 x 8.2  cm,simple lt ov cyst 2.6 x 2.7 x 2.3 cm, arterial and venous flow notes in both ov's,no free fluid seen,no pain during ultrasound [employer] 02/02/2016 9:16 AM Clinical Impression and recommendations: I have reviewed the sonogram results above. Combined with the patient's current clinical course, below are my impressions and  any appropriate recommendations for management based on the sonographic findings: 1. Persistent simple cyst right ovary now minimally and large compared to ultrasound  March 2017 with CA 125  also normal, without internal solid components or excrescences. Generally benign characteristics 2. Small simple left ovarian cyst appears functional in nature 3. Small uterus anteverted without distinct abnormality, thin endometrium Surgical consultation in place FERGUSON,JOHN V   [place] Art/ven Flow Abd Pelv Doppler Limited  Result Date: 02/16/2016 GYNECOLOGIC SONOGRAM Henri Kaz is a 30 y.o. G1P1001 LMP 01/29/2016 for a pelvic sonogram to f/u right ovarian cyst. Uterus                      8.3 x 5.5 x 4.9 cm, homogeneous anteverted uterus,wnl Endometrium          4.1 mm, symmetrical, wnl Right ovary             10.4 x 7.4 x 9..2cm,simple right ov cyst 10.4 x 6.6 x 8.2 cm Left ovary                3.4 x 2.9 x 2.7 cm,simple lt ov cyst 2.6 x 2.7 x 2.3 cm Arterial and venous flow notes in both ov's,no free fluid seen,no pain during ultrasound Technician Comments: PELVIC US TA/TV W/DOPPLER: Homogeneous anteverted uterus,simple right ov cyst 10.4 x 6.6 x 8.2 cm,simple lt ov cyst 2.6 x 2.7 x 2.3 cm, arterial and venous flow notes in both ov's,no free fluid seen,no pain during ultrasound [employer] 02/02/2016 9:16 AM Clinical Impression and recommendations: I have reviewed the sonogram results above. Combined with the patient's current clinical course, below are my impressions and any appropriate recommendations for management based on the sonographic findings: 1. Persistent simple cyst right ovary now minimally and large compared to ultrasound  March 2017 with CA 125  also normal, without internal solid components or excrescences. Generally benign characteristics 2. Small simple left ovarian cyst appears functional in nature 3. Small uterus anteverted without distinct abnormality, thin endometrium Surgical consultation in  place FERGUSON,JOHN V       Assessment:     Patient Active Problem List   Diagnosis Date Noted  . Ovarian cyst, right 10/03/2014    Plan: Laparoscopic management of right ovarian cyst, cystectomy vs oophorectomy  Monice Lundy H 03/02/2016 10:15 PM

## 2016-03-03 NOTE — Transfer of Care (Signed)
Immediate Anesthesia Transfer of Care Note  Patient: [name]  Procedure(s) Performed: Procedure(s): LAPAROSCOPIC RIGHT OVARIAN CYSTECTOMY AND RIGHT OOPHORECTOMY (Right)  Patient Location: PACU  Anesthesia Type:MAC  Level of Consciousness: awake  Airway & Oxygen Therapy: Patient Spontanous Breathing and Patient connected to face mask oxygen  Post-op Assessment: Report given to RN  Post vital signs: Reviewed and stable  Last Vitals:  Vitals:   03/03/16 0810 03/03/16 0815  BP: 119/79 113/78  Resp: (!) 26 18  Temp:      Last Pain:  Vitals:   03/03/16 0731  TempSrc: Oral      Patients Stated Pain Goal: 6 ([id number])  Complications: No apparent anesthesia complications

## 2016-03-04 ENCOUNTER — Emergency Department (HOSPITAL_COMMUNITY): Payer: Managed Care, Other (non HMO)

## 2016-03-04 ENCOUNTER — Encounter (HOSPITAL_COMMUNITY): Payer: Self-pay

## 2016-03-04 ENCOUNTER — Emergency Department (HOSPITAL_COMMUNITY)
Admission: EM | Admit: 2016-03-04 | Discharge: 2016-03-04 | Disposition: A | Discharge status: Home / Self Care | Payer: Managed Care, Other (non HMO) | Attending: Emergency Medicine | Admitting: Emergency Medicine

## 2016-03-04 DIAGNOSIS — R079 Chest pain, unspecified: Secondary | ICD-10-CM

## 2016-03-04 DIAGNOSIS — Z79899 Other long term (current) drug therapy: Secondary | ICD-10-CM | POA: Diagnosis not present

## 2016-03-04 LAB — URINALYSIS, ROUTINE W REFLEX MICROSCOPIC
Bilirubin Urine: NEGATIVE
GLUCOSE, UA: NEGATIVE mg/dL
HGB URINE DIPSTICK: NEGATIVE
KETONES UR: NEGATIVE mg/dL
Nitrite: NEGATIVE
PH: 5.5 (ref 5.0–8.0)
PROTEIN: NEGATIVE mg/dL
Specific Gravity, Urine: 1.02 (ref 1.005–1.030)

## 2016-03-04 LAB — CBC
HCT: 35.6 % — ABNORMAL LOW (ref 36.0–46.0)
Hemoglobin: 11.9 g/dL — ABNORMAL LOW (ref 12.0–15.0)
MCH: 29.9 pg (ref 26.0–34.0)
MCHC: 33.4 g/dL (ref 30.0–36.0)
MCV: 89.4 fL (ref 78.0–100.0)
PLATELETS: 243 10*3/uL (ref 150–400)
RBC: 3.98 MIL/uL (ref 3.87–5.11)
RDW: 12.6 % (ref 11.5–15.5)
WBC: 8.3 10*3/uL (ref 4.0–10.5)

## 2016-03-04 LAB — BASIC METABOLIC PANEL
Anion gap: 10 (ref 5–15)
BUN: 8 mg/dL (ref 6–20)
CALCIUM: 8.3 mg/dL — AB (ref 8.9–10.3)
CO2: 26 mmol/L (ref 22–32)
CREATININE: 0.74 mg/dL (ref 0.44–1.00)
Chloride: 105 mmol/L (ref 101–111)
GFR calc Af Amer: >60 mL/min (ref >60)
GLUCOSE: 101 mg/dL — AB (ref 65–99)
Potassium: 3.2 mmol/L — ABNORMAL LOW (ref 3.5–5.1)
SODIUM: 141 mmol/L (ref 135–145)

## 2016-03-04 LAB — URINE MICROSCOPIC-ADD ON

## 2016-03-04 LAB — TROPONIN I

## 2016-03-04 LAB — PREGNANCY, URINE: Preg Test, Ur: NEGATIVE

## 2016-03-04 NOTE — ED Provider Notes (Signed)
Teresa Bright DEPT Provider Note   CSN: [id number] Arrival date & time: 03/04/16  1359     History   Chief Complaint Chief Complaint  Patient presents with  . Chest Pain    HPI Teresa Bright is a 30 y.o. female.  HPI  30 year old female with chest pain. She is status post cyst removal and oophorectomy yesterday. She reports feeling relatively well considering her recent surgery until she began developing some chest soreness yesterday evening. She describes an achy feeling in her upper chest and neck. Better at work rest. Worse with certain movements. No acute respiratory complaints. Pain does not radiate. No fevers or chills. No cough. No unusual leg pain or swelling. Denies or vomiting.  Past Medical History:  Diagnosis Date  . GERD (gastroesophageal reflux disease)   . Pregnant 08/20/2014    Patient Active Problem List   Diagnosis Date Noted  . Ovarian cyst, right 10/03/2014    Past Surgical History:  Procedure Laterality Date  . cysterectomy    . OOPHORECTOMY    . WISDOM TOOTH EXTRACTION      OB History    Gravida Para Term Preterm AB Living   1 1 1     1    SAB TAB Ectopic Multiple Live Births         0 1       Home Medications    Prior to Admission medications   Medication Sig Start Date End Date Taking? Authorizing Provider  calcium carbonate (TUMS - DOSED IN MG ELEMENTAL CALCIUM) 500 MG chewable tablet Chew 2 tablets by mouth daily as needed for indigestion or heartburn.   Yes Historical Provider, MD  ketorolac (TORADOL) 10 MG tablet Take 1 tablet (10 mg total) by mouth every 8 (eight) hours as needed. 03/03/16  Yes Florian Buff, MD  Norethindrone-Ethinyl Estradiol-Fe Biphas (LO LOESTRIN FE) 1 MG-10 MCG / 10 MCG tablet Take 1 tablet by mouth daily. 08/12/15  Yes Roma Schanz, CNM  ondansetron (ZOFRAN) 8 MG tablet Take 1 tablet (8 mg total) by mouth every 8 (eight) hours as needed for nausea. 03/03/16  Yes Florian Buff, MD  oxyCODONE-acetaminophen  (ROXICET) 5-325 MG tablet Take 1-2 tablets by mouth every 4 (four) hours as needed for severe pain. 03/03/16  Yes Florian Buff, MD    Family History Family History  Problem Relation Age of Onset  . Cancer Maternal Grandmother     skin  . Diabetes Maternal Grandmother   . Stroke Maternal Grandfather   . Aneurysm Maternal Grandfather     brain  . Cancer Paternal Grandmother     bone and breast    Social History Social History  Substance Use Topics  . Smoking status: Never Smoker  . Smokeless tobacco: Never Used  . Alcohol use No     Comment: occassionally prior to pregnancy     Allergies   Review of patient's allergies indicates no known allergies.   Review of Systems Review of Systems  All systems reviewed and negative, other than as noted in HPI.   Physical Exam Updated Vital Signs BP 128/83   Pulse 90   Temp 98.7 F (37.1 C) (Oral)   Resp (!) 27   Ht 5\' 4"  (1.626 m)   Wt 197 lb (89.4 kg)   LMP 02/26/2016   SpO2 97%   Breastfeeding? No   BMI 33.81 kg/m   Physical Exam  Constitutional: She appears well-developed and well-nourished. No distress.  HENT:  Head: Normocephalic  and atraumatic.  Eyes: Conjunctivae are normal. Right eye exhibits no discharge. Left eye exhibits no discharge.  Neck: Neck supple.  Cardiovascular: Normal rate, regular rhythm and normal heart sounds.  Exam reveals no gallop and no friction rub.   No murmur heard. Pulmonary/Chest: Effort normal and breath sounds normal. No respiratory distress.  Abdominal: Soft. She exhibits no distension. There is no tenderness.  Abdomen has mild distention. Soft. Tenderness which seems appropriate for recent procedure. Laparoscopic incisions are bandaged. Bandages are clean and dry.  Musculoskeletal: She exhibits no edema or tenderness.  Neurological: She is alert.  Skin: Skin is warm and dry.  Psychiatric: She has a normal mood and affect. Her behavior is normal. Thought content normal.  Nursing  note and vitals reviewed.    ED Treatments / Results  Labs (all labs ordered are listed, but only abnormal results are displayed) Labs Reviewed  CBC - Abnormal; Notable for the following:       Result Value   Hemoglobin 11.9 (*)    HCT 35.6 (*)    All other components within normal limits  BASIC METABOLIC PANEL - Abnormal; Notable for the following:    Potassium 3.2 (*)    Glucose, Bld 101 (*)    Calcium 8.3 (*)    All other components within normal limits  URINALYSIS, ROUTINE W REFLEX MICROSCOPIC (NOT AT [facility]) - Abnormal; Notable for the following:    Leukocytes, UA MODERATE (*)    All other components within normal limits  URINE MICROSCOPIC-ADD ON - Abnormal; Notable for the following:    Squamous Epithelial / LPF TOO NUMEROUS TO COUNT (*)    Bacteria, UA FEW (*)    All other components within normal limits  TROPONIN I  PREGNANCY, URINE    EKG  EKG Interpretation None       Radiology Dg Chest 2 View  Result Date: 03/04/2016 CLINICAL DATA:  Intermittent right-sided chest pain since last night. Surgery yesterday, cyst removal from right ovary. EXAM: CHEST  2 VIEW COMPARISON:  None. FINDINGS: Heart size is normal. Overall cardiomediastinal silhouette is normal in size and configuration. Patchy small opacities at each lung base are most likely atelectasis. No pleural effusion or pneumothorax seen. Osseous structures about the chest are unremarkable. Free intraperitoneal air is identified under the right hemidiaphragm, compatible with the given history of recent surgery. Soft tissues about the chest are otherwise unremarkable. IMPRESSION: 1. Patchy small opacities at each lung base, most likely atelectasis. If febrile, could not exclude early developing pneumonia or aspiration pneumonitis. 2. Lungs otherwise clear. 3. Free intraperitoneal air underlying the right hemidiaphragm, compatible with the given history of recent surgery. Electronically Signed   By: Franki Cabot M.D.    On: 03/04/2016 16:36    Procedures Procedures (including critical care time)  Medications Ordered in ED Medications - No data to display   Initial Impression / Assessment and Plan / ED Course  I have reviewed the triage vital signs and the nursing notes.  Pertinent labs & imaging results that were available during my care of the patient were reviewed by me and considered in my medical decision making (see chart for details).  Clinical Course    30 year old female with upper chest and neck pain after surgery yesterday. Consider PE but doub. Atypical for ACS. Doubt dissection or infectious etiology. Infiltrate noted in the lower lobes but suspect this is probably atelectasis. Pain could potentially be from some diaphragmatic irritation from the insufflated gas during the procedure.  Low suspicion for emergent process. Return precautions were discussed.  Final Clinical Impressions(s) / ED Diagnoses   Final diagnoses:  Nonspecific chest pain    New Prescriptions New Prescriptions   No medications on file     Virgel Manifold, MD 03/07/16 [phone]

## 2016-03-04 NOTE — ED Triage Notes (Addendum)
Pt reports had a cysterectomy and an ovary removed yesterday and started having pain in center of chest last night.  Reports chest is not sore to touch but it is worse when she moves.  Denies any SOB.

## 2016-03-08 ENCOUNTER — Encounter (HOSPITAL_COMMUNITY): Payer: Self-pay | Admitting: Obstetrics & Gynecology

## 2016-03-12 ENCOUNTER — Ambulatory Visit (INDEPENDENT_AMBULATORY_CARE_PROVIDER_SITE_OTHER): Payer: Managed Care, Other (non HMO) | Admitting: Obstetrics & Gynecology

## 2016-03-12 ENCOUNTER — Encounter: Payer: Self-pay | Admitting: Obstetrics & Gynecology

## 2016-03-12 VITALS — BP 120/80 | HR 80 | Ht 64.0 in | Wt 197.3 lb

## 2016-03-12 DIAGNOSIS — D3911 Neoplasm of uncertain behavior of right ovary: Secondary | ICD-10-CM

## 2016-03-12 NOTE — Progress Notes (Signed)
Discussion of Pathology I informed the patient of the diagnosis based on the pathology and the uncertainty surrounding it. 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 Everitt Amber in the presence of the patient and had the phone and speaker phone so she could hear exactly what we were talking about Dr. Denman George basically echo did what I had been thinking and told the patient she needs surveillance but is very unlikely that this is, cause a recurrence The preoperative CA-125's have all been normal Dr. Denman George stated there was less than 5% chance of this is recurrence and as a result would not recommend removing the left ovary at this time Once the patient's completed childbearing and she is perimenopausal it would be reasonable to remove it at that time and do a hysterectomy as well again based on Dr. Serita Grit recommendation Dr. Denman George did not feel that she needed to specifically see the patient but if she could at any time the positively involved in the patient's care she would be glad to We are going to do an ultrasound in 6 months a CT in 1 year And then do yearly surveillance probably an ultrasound 2 years and then a CT 1 year Additionally we will do CA 125 on a yearly basis  Patient understands her the entire conversation and agrees with the overall management plan   HPI: Patient returns for routine postoperative follow-up having undergone laparoscopic right ovarian cystectomy with subsequent right oophorectomy on 03/03/2016.  The patient's immediate postoperative recovery has been unremarkable. Since hospital discharge the patient reports some abdominal soreness otherwise doing well not really have any problems.   Current Outpatient Prescriptions: Norethindrone-Ethinyl Estradiol-Fe Biphas (LO LOESTRIN FE) 1 MG-10 MCG / 10 MCG tablet, Take 1 tablet by mouth daily., Disp:  3 Package, Rfl: 3 oxyCODONE-acetaminophen (ROXICET) 5-325 MG tablet, Take 1-2 tablets by mouth every 4 (four) hours as needed for severe pain. (Patient not taking: Reported on 03/12/2016), Disp: 30 tablet, Rfl: 0  No current facility-administered medications for this visit.     Blood pressure 120/80, pulse 80, height 5\' 4"  (1.626 m), weight 197 lb 4.8 oz (89.5 kg), last menstrual period 02/26/2016, not currently breastfeeding.  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

## 2016-03-13 LAB — CA 125: CA 125: 22.1 U/mL (ref 0.0–38.1)

## 2016-03-14 DIAGNOSIS — D3910 Neoplasm of uncertain behavior of unspecified ovary: Secondary | ICD-10-CM | POA: Insufficient documentation

## 2016-03-25 ENCOUNTER — Encounter: Payer: Self-pay | Admitting: Obstetrics & Gynecology

## 2016-04-20 ENCOUNTER — Encounter: Payer: Self-pay | Admitting: Obstetrics & Gynecology

## 2016-06-23 ENCOUNTER — Encounter: Payer: Self-pay | Admitting: Obstetrics & Gynecology

## 2016-07-06 ENCOUNTER — Ambulatory Visit: Payer: Managed Care, Other (non HMO) | Admitting: Physician Assistant

## 2016-07-16 ENCOUNTER — Ambulatory Visit (INDEPENDENT_AMBULATORY_CARE_PROVIDER_SITE_OTHER): Payer: Managed Care, Other (non HMO) | Admitting: Physician Assistant

## 2016-07-16 ENCOUNTER — Encounter: Payer: Self-pay | Admitting: Physician Assistant

## 2016-07-16 VITALS — BP 110/80 | HR 73 | Temp 97.9°F | Resp 16 | Ht 63.0 in | Wt 196.0 lb

## 2016-07-16 DIAGNOSIS — Z299 Encounter for prophylactic measures, unspecified: Secondary | ICD-10-CM | POA: Diagnosis not present

## 2016-07-16 DIAGNOSIS — Z6834 Body mass index (BMI) 34.0-34.9, adult: Secondary | ICD-10-CM | POA: Diagnosis not present

## 2016-07-16 DIAGNOSIS — Z23 Encounter for immunization: Secondary | ICD-10-CM | POA: Diagnosis not present

## 2016-07-16 DIAGNOSIS — E6609 Other obesity due to excess calories: Secondary | ICD-10-CM | POA: Diagnosis not present

## 2016-07-16 DIAGNOSIS — K219 Gastro-esophageal reflux disease without esophagitis: Secondary | ICD-10-CM

## 2016-07-16 LAB — LIPID PANEL
CHOLESTEROL: 193 mg/dL (ref 0–200)
HDL: 44.4 mg/dL (ref >39.00)
LDL Cholesterol: 120 mg/dL — ABNORMAL HIGH (ref 0–99)
NonHDL: 148.25
Total CHOL/HDL Ratio: 4
Triglycerides: 140 mg/dL (ref 0.0–149.0)
VLDL: 28 mg/dL (ref 0.0–40.0)

## 2016-07-16 LAB — COMPREHENSIVE METABOLIC PANEL
ALBUMIN: 4.1 g/dL (ref 3.5–5.2)
ALK PHOS: 47 U/L (ref 39–117)
ALT: 14 U/L (ref 0–35)
AST: 11 U/L (ref 0–37)
BILIRUBIN TOTAL: 0.6 mg/dL (ref 0.2–1.2)
BUN: 12 mg/dL (ref 6–23)
CALCIUM: 8.9 mg/dL (ref 8.4–10.5)
CHLORIDE: 107 meq/L (ref 96–112)
CO2: 27 mEq/L (ref 19–32)
CREATININE: 0.72 mg/dL (ref 0.40–1.20)
GFR: 100.26 mL/min (ref >60.00)
Glucose, Bld: 106 mg/dL — ABNORMAL HIGH (ref 70–99)
Potassium: 4.3 mEq/L (ref 3.5–5.1)
Sodium: 141 mEq/L (ref 135–145)
Total Protein: 6.5 g/dL (ref 6.0–8.3)

## 2016-07-16 LAB — T4, FREE: FREE T4: 0.75 ng/dL (ref 0.60–1.60)

## 2016-07-16 LAB — TSH: TSH: 0.5 u[IU]/mL (ref 0.35–4.50)

## 2016-07-16 MED ORDER — RANITIDINE HCL 150 MG PO CAPS: 150.0000 mg | ORAL_CAPSULE | Freq: Two times a day (BID) | ORAL | 0 refills | Status: DC

## 2016-07-16 NOTE — Patient Instructions (Signed)
Please go to the lab for blood work. I will call you with your results.  Please eat a diet high in fiber.  Lean vegetables and protein.  Increase fluid intake.  Work on increasing aerobic exercise to a goal of at least 150 minutes per week to promote healthy weight.  Use Zantac as directed when acid reflux is present. Limit late-night eating, coffee, alcohol. Continue daily probiotic.   We will alter regimen and schedule follow-up based on results.   Bland Diet Introduction A bland diet consists of foods that do not have a lot of fat or fiber. Foods without fat or fiber are easier for the body to digest. They are also less likely to irritate your mouth, throat, stomach, and other parts of your gastrointestinal tract. A bland diet is sometimes called a BRAT diet. What is my plan? Your health care provider or dietitian may recommend specific changes to your diet to prevent and treat your symptoms, such as:  Eating small meals often.  Cooking food until it is soft enough to chew easily.  Chewing your food well.  Drinking fluids slowly.  Not eating foods that are very spicy, sour, or fatty.  Not eating citrus fruits, such as oranges and grapefruit. What do I need to know about this diet?  Eat a variety of foods from the bland diet food list.  Do not follow a bland diet longer than you have to.  Ask your health care provider whether you should take vitamins. What foods can I eat? Grains  Hot cereals, such as cream of wheat. Bread, crackers, or tortillas made from refined white flour. Rice. Vegetables  Canned or cooked vegetables. Mashed or boiled potatoes. Fruits  Bananas. Applesauce. Other types of cooked or canned fruit with the skin and seeds removed, such as canned peaches or pears. Meats and Other Protein Sources  Scrambled eggs. Creamy peanut butter or other nut butters. Lean, well-cooked meats, such as chicken or fish. Tofu. Soups or broths. Dairy  Low-fat dairy  products, such as milk, cottage cheese, or yogurt. Beverages  Water. Herbal tea. Apple juice. Sweets and Desserts  Pudding. Custard. Fruit gelatin. Ice cream. Fats and Oils  Mild salad dressings. Canola or olive oil. The items listed above may not be a complete list of allowed foods or beverages. Contact your dietitian for more options.  What foods are not recommended? Foods and ingredients that are often not recommended include:  Spicy foods, such as hot sauce or salsa.  Fried foods.  Sour foods, such as pickled or fermented foods.  Raw vegetables or fruits, especially citrus or berries.  Caffeinated drinks.  Alcohol.  Strongly flavored seasonings or condiments. The items listed above may not be a complete list of foods and beverages that are not allowed. Contact your dietitian for more information.  This information is not intended to replace advice given to you by your health care provider. Make sure you discuss any questions you have with your health care provider. Document Released: 09/29/2015 Document Revised: 11/13/2015 Document Reviewed: 06/19/2014  2017 Elsevier

## 2016-07-16 NOTE — Progress Notes (Signed)
Patient presents to clinic today to establish care. Patient lives in [place], [place] with her husband, step-daughter (Anne-Cailin) and son Sharene Butters). Works for [employer].   Body mass index is 34.72 kg/m. Endorses weight gain after birth of her son in 2016. Has been working on diet and exercise. Is trying to get back into daily exercise regimen to promote weight loss. Denies history of thyroid disorder, hypertension, hyperlipidemia or diabetes mellitus.   Is followed by Gynecology, Dr Elonda Husky, for routine care. PAP up-to-date per patient. Denies hx of abnormal PAP smear.  Acute Concerns: Patient endorses occasional heart burn symptoms and epigastric discomfort. Notes some occasional constipation with straining if she eats poorly. Denies melena, hematochezia or tenesmus. Denies nausea or vomiting. Rare consumption of alcohol. Denies use of tobacco products or recreational drugs. Does drink coffee daily.   Health Maintenance: Immunizations -- Tetanus up-to-date.  PAP -- up-to-date.   Past Medical History:  Diagnosis Date  . GERD (gastroesophageal reflux disease)   . History of chickenpox   . Migraine   . Pregnant 08/20/2014    Past Surgical History:  Procedure Laterality Date  . cysterectomy    . LAPAROSCOPIC OVARIAN CYSTECTOMY Right 03/03/2016   Procedure: LAPAROSCOPIC RIGHT OVARIAN CYSTECTOMY AND RIGHT OOPHORECTOMY;  Surgeon: Florian Buff, MD;  Location: [facility];  Service: Gynecology;  Laterality: Right;  . OOPHORECTOMY Right   . WISDOM TOOTH EXTRACTION      Current Outpatient Prescriptions on File Prior to Visit  Medication Sig Dispense Refill  . Norethindrone-Ethinyl Estradiol-Fe Biphas (LO LOESTRIN FE) 1 MG-10 MCG / 10 MCG tablet Take 1 tablet by mouth daily. 3 Package 3   No current facility-administered medications on file prior to visit.     No Known Allergies  Family History  Problem Relation Age of Onset  . Hypertension Mother   . Hyperlipidemia Mother   .  Anxiety disorder Mother   . Healthy Father   . Cancer Maternal Grandmother     skin   . Diabetes Maternal Grandmother   . Dementia Maternal Grandmother   . Stroke Maternal Grandfather   . Aneurysm Maternal Grandfather     brain  . Cancer Paternal Grandmother     bone and breast  . Mental illness Maternal Uncle   . Stroke Maternal Uncle     Social History   Social History  . Marital status: Married    Spouse name: N/A  . Number of children: N/A  . Years of education: N/A   Occupational History  . Not on file.   Social History Main Topics  . Smoking status: Never Smoker  . Smokeless tobacco: Never Used  . Alcohol use 0.0 oz/week     Comment: occassionally   . Drug use: No  . Sexual activity: Yes    Partners: Male    Birth control/ protection: Pill   Other Topics Concern  . Not on file   Social History Narrative  . No narrative on file    Review of Systems  Constitutional: Negative for fever and weight loss.  HENT: Negative for ear discharge, ear pain, hearing loss and tinnitus.   Eyes: Negative for blurred vision, double vision, photophobia and pain.  Respiratory: Negative for cough and shortness of breath.   Cardiovascular: Negative for chest pain and palpitations.  Gastrointestinal: Positive for constipation and heartburn. Negative for abdominal pain, blood in stool, diarrhea, melena, nausea and vomiting.  Genitourinary: Negative for dysuria, flank pain, frequency, hematuria and urgency.  Musculoskeletal:  Negative for falls.  Neurological: Negative for dizziness, loss of consciousness and headaches.  Endo/Heme/Allergies: Negative for environmental allergies.  Psychiatric/Behavioral: Negative for depression, hallucinations, substance abuse and suicidal ideas. The patient is not nervous/anxious and does not have insomnia.     BP 110/80   Pulse 73   Temp 97.9 F (36.6 C) (Oral)   Resp 16   Ht 5' 3"  (1.6 m)   Wt 196 lb (88.9 kg)   SpO2 98%   BMI 34.72  kg/m   Physical Exam  Constitutional: She is oriented to person, place, and time and well-developed, well-nourished, and in no distress.  HENT:  Head: Normocephalic and atraumatic.  Right Ear: Tympanic membrane, external ear and ear canal normal.  Left Ear: Tympanic membrane, external ear and ear canal normal.  Nose: Nose normal. No mucosal edema.  Mouth/Throat: Uvula is midline, oropharynx is clear and moist and mucous membranes are normal. No oropharyngeal exudate or posterior oropharyngeal erythema.  Eyes: Conjunctivae are normal. Pupils are equal, round, and reactive to light.  Neck: Neck supple. No thyromegaly present.  Cardiovascular: Normal rate, regular rhythm, normal heart sounds and intact distal pulses.   Pulmonary/Chest: Effort normal and breath sounds normal. No respiratory distress. She has no wheezes. She has no rales.  Abdominal: Soft. Bowel sounds are normal. She exhibits no distension and no mass. There is no tenderness. There is no rebound and no guarding.  Lymphadenopathy:    She has no cervical adenopathy.  Neurological: She is alert and oriented to person, place, and time. No cranial nerve deficit.  Skin: Skin is warm and dry. No rash noted.  Psychiatric: Affect normal.  Vitals reviewed.  Assessment/Plan: 1. Preventive measure Will obtain preventive lab panel today to assess cholesterol, thyroid status and renal/hepatic function. - TSH - Comp Met (CMET) - T4, free - Lipid panel; Future - Lipid panel  2. Encounter for immunization Flu shot given today. - Flu Vaccine QUAD 36+ mos IM  3. Class 1 obesity due to excess calories without serious comorbidity with body mass index (BMI) of 34.0 to 34.9 in adult Discussed dietary and exercise recommendations. Will start regimen at home. Will check lipid panel and glucose today.  4. Gastroesophageal reflux disease without esophagitis Occasional symptoms. Dietary triggers reviewed. GERD diet handout given. Start daily  probiotic. Rx Zantac as directed if needed for heartburn. FU if not improving -- may need PPI.    Leeanne Rio, PA-C

## 2016-07-16 NOTE — Progress Notes (Signed)
Pre visit review using our clinic review tool, if applicable. No additional management support is needed unless otherwise documented below in the visit note. 

## 2016-07-17 ENCOUNTER — Encounter: Payer: Self-pay | Admitting: Physician Assistant

## 2016-07-27 ENCOUNTER — Other Ambulatory Visit: Payer: Self-pay | Admitting: Women's Health

## 2016-07-28 ENCOUNTER — Encounter: Payer: Self-pay | Admitting: Women's Health

## 2016-07-30 ENCOUNTER — Encounter: Payer: Self-pay | Admitting: Physician Assistant

## 2016-08-04 ENCOUNTER — Encounter: Payer: Self-pay | Admitting: Emergency Medicine

## 2016-09-03 ENCOUNTER — Other Ambulatory Visit: Payer: Managed Care, Other (non HMO) | Admitting: Women's Health

## 2016-09-09 ENCOUNTER — Ambulatory Visit (INDEPENDENT_AMBULATORY_CARE_PROVIDER_SITE_OTHER): Payer: Managed Care, Other (non HMO) | Admitting: Obstetrics & Gynecology

## 2016-09-09 ENCOUNTER — Ambulatory Visit (INDEPENDENT_AMBULATORY_CARE_PROVIDER_SITE_OTHER): Payer: Managed Care, Other (non HMO)

## 2016-09-09 ENCOUNTER — Encounter: Payer: Self-pay | Admitting: Obstetrics & Gynecology

## 2016-09-09 VITALS — BP 100/70 | HR 74 | Wt 197.0 lb

## 2016-09-09 DIAGNOSIS — D3911 Neoplasm of uncertain behavior of right ovary: Secondary | ICD-10-CM

## 2016-09-09 NOTE — Progress Notes (Signed)
PELVIC US TA/TV: homogeneous anteverted uterus,wnl,EEC 6 mm,right oophorectomy,right adnexa wnl,normal left ovary,no free fluid,left ovary appears mobile,no pain during ultrasound

## 2016-09-09 NOTE — Progress Notes (Signed)
Follow up appointment for results  Chief Complaint  Patient presents with  . Follow-up    ultrasound/ room #10    Blood pressure 100/70, pulse 74, weight 197 lb (89.4 kg), last menstrual period 08/19/2016, not currently breastfeeding.  US Transvaginal Non-ob  Result Date: 09/09/2016 GYNECOLOGIC SONOGRAM Teresa Bright is a 31 y.o. G1P1001 LMP 08/19/2016 for a pelvic sonogram to f/u right oophorectomy,borderline ovarian malignancy. Uterus                      9.9 x 5.3 x 6.1 cm,  homogeneous anteverted uterus,wnl Endometrium          6 mm, symmetrical, wnl Right ovary             Right oophorectomy,right adnexa wnl Left ovary                4.1 x 2.1 x 3.6 cm, wnl No free fluid Technician Comments: PELVIC US TA/TV: homogeneous anteverted uterus,wnl,EEC 6 mm,right oophorectomy,right adnexa wnl,normal left ovary,no free fluid,left ovary appears mobile,no pain during ultrasound [employer] 09/09/2016 9:02 AM Clinical Impression and recommendations: I have reviewed the sonogram results above, combined with the patient's current clinical course, below are my impressions and any appropriate recommendations for management based on the sonographic findings. Normal uterus and endometrium Left ovary is normal Right ovary of course is surgically absent Alternate CT scan with sonogram every 6 months Scottlynn Lindell H 09/09/2016 10:02 AM   US Pelvis Complete  Result Date: 09/09/2016 GYNECOLOGIC SONOGRAM Teresa Bright is a 31 y.o. G1P1001 LMP 08/19/2016 for a pelvic sonogram to f/u right oophorectomy,borderline ovarian malignancy. Uterus                      9.9 x 5.3 x 6.1 cm,  homogeneous anteverted uterus,wnl Endometrium          6 mm, symmetrical, wnl Right ovary             Right oophorectomy,right adnexa wnl Left ovary                4.1 x 2.1 x 3.6 cm, wnl No free fluid Technician Comments: PELVIC US TA/TV: homogeneous anteverted uterus,wnl,EEC 6 mm,right oophorectomy,right adnexa wnl,normal left ovary,no free  fluid,left ovary appears mobile,no pain during ultrasound [employer] 09/09/2016 9:02 AM Clinical Impression and recommendations: I have reviewed the sonogram results above, combined with the patient's current clinical course, below are my impressions and any appropriate recommendations for management based on the sonographic findings. Normal uterus and endometrium Left ovary is normal Right ovary of course is surgically absent Alternate CT scan with sonogram every 6 months Zamiyah Resendes H 09/09/2016 10:02 AM      MEDS ordered this encounter: No orders of the defined types were placed in this encounter.   Orders for this encounter: No orders of the defined types were placed in this encounter.   Impression: Ovarian tumor of borderline malignancy, right    Plan: Normal sonogram, No evidence of recurrence or persistence Will continue to do q6 month surveillance, next with CT scan  Follow Up: Return in about 1 year (around 09/09/2017) for yearly, with Dr Elonda Husky.       Face to face time:  15 minutes  Greater than 50% of the visit time was spent in counseling and coordination of care with the patient.  The summary and outline of the counseling and care coordination is summarized in the note above.   All questions  were answered.  Past Medical History:  Diagnosis Date  . GERD (gastroesophageal reflux disease)   . History of chickenpox   . Migraine   . Pregnant 08/20/2014    Past Surgical History:  Procedure Laterality Date  . cysterectomy    . LAPAROSCOPIC OVARIAN CYSTECTOMY Right 03/03/2016   Procedure: LAPAROSCOPIC RIGHT OVARIAN CYSTECTOMY AND RIGHT OOPHORECTOMY;  Surgeon: Florian Buff, MD;  Location: [facility];  Service: Gynecology;  Laterality: Right;  . OOPHORECTOMY Right   . WISDOM TOOTH EXTRACTION      OB History    Gravida Para Term Preterm AB Living   1 1 1     1    SAB TAB Ectopic Multiple Live Births         0 1      No Known Allergies  Social History    Social History  . Marital status: Married    Spouse name: N/A  . Number of children: N/A  . Years of education: N/A   Social History Main Topics  . Smoking status: Never Smoker  . Smokeless tobacco: Never Used  . Alcohol use 0.0 oz/week     Comment: occassionally   . Drug use: No  . Sexual activity: Yes    Partners: Male    Birth control/ protection: Pill   Other Topics Concern  . None   Social History Narrative  . None    Family History  Problem Relation Age of Onset  . Hypertension Mother   . Hyperlipidemia Mother   . Anxiety disorder Mother   . Healthy Father   . Cancer Maternal Grandmother     skin ; also intestinal malignancy in 33s  . Diabetes Maternal Grandmother   . Dementia Maternal Grandmother   . Stroke Maternal Grandfather   . Aneurysm Maternal Grandfather     brain  . Cancer Paternal Grandmother     bone and breast  . Mental illness Maternal Uncle   . Stroke Maternal Uncle

## 2016-10-07 ENCOUNTER — Ambulatory Visit (INDEPENDENT_AMBULATORY_CARE_PROVIDER_SITE_OTHER): Payer: Managed Care, Other (non HMO) | Admitting: Women's Health

## 2016-10-07 ENCOUNTER — Other Ambulatory Visit (HOSPITAL_COMMUNITY)
Admission: RE | Admit: 2016-10-07 | Discharge: 2016-10-07 | Disposition: A | Discharge status: Home / Self Care | Payer: Managed Care, Other (non HMO) | Source: Ambulatory Visit | Attending: Obstetrics and Gynecology | Admitting: Obstetrics and Gynecology

## 2016-10-07 ENCOUNTER — Encounter: Payer: Self-pay | Admitting: Women's Health

## 2016-10-07 VITALS — BP 110/60 | HR 80 | Ht 64.0 in | Wt 196.0 lb

## 2016-10-07 DIAGNOSIS — Z01419 Encounter for gynecological examination (general) (routine) without abnormal findings: Secondary | ICD-10-CM | POA: Diagnosis not present

## 2016-10-07 MED ORDER — NORETHIN-ETH ESTRAD-FE BIPHAS 1 MG-10 MCG / 10 MCG PO TABS: 1.0000 | ORAL_TABLET | Freq: Every day | ORAL | 3 refills | Status: DC

## 2016-10-07 NOTE — Addendum Note (Signed)
Addended by: Diona Fanti A on: 10/07/2016 04:55 PM   Modules accepted: Orders

## 2016-10-07 NOTE — Addendum Note (Signed)
Addended by: Roma Schanz on: 10/07/2016 04:53 PM   Modules accepted: Level of Service

## 2016-10-07 NOTE — Patient Instructions (Signed)
For co-pay card, pt to text "Lo Loestrin Fe " to [phone]

## 2016-10-07 NOTE — Progress Notes (Signed)
Subjective:   Teresa Bright is a 31 y.o. G7P1001 Caucasian female here for a routine well-woman exam.  Patient's last menstrual period was 09/27/2016.    Current complaints: hard bump above umbilicus, showed to PCP, thought it could be diastasis recti from recent pregnancy. Doesn't really bother her.  Had borderline malignant Rt ovarian tumor removed 03/03/16 by Dr. Elonda Husky, last f/u was 3/22, plans alternating CT scan w/ u/s q 65mths, next will be CT scan Sept 2018 Concerned about hereditary cancers. Her PGM had breast CA>metz to bone, now deceased MGM had colon CA>skin CA- still alive, both dx at later age. No family h/o young dx breast ca or any ovarian cancer she is aware of. Still interested in talking w/ genetic counselor to see if she is a candidate for testing.  PCP: Renie Ora       Does not desire labs  Social History: Sexual: heterosexual Marital Status: married Living situation: with spouse Tobacco/alcohol: none Illicit drugs: no history of illicit drug use  The following portions of the patient's history were reviewed and updated as appropriate: allergies, current medications, past family history, past medical history, past social history, past surgical history and problem list.  Past Medical History Past Medical History:  Diagnosis Date  . GERD (gastroesophageal reflux disease)   . History of chickenpox   . Migraine   . Pregnant 08/20/2014    Past Surgical History Past Surgical History:  Procedure Laterality Date  . cysterectomy    . LAPAROSCOPIC OVARIAN CYSTECTOMY Right 03/03/2016   Procedure: LAPAROSCOPIC RIGHT OVARIAN CYSTECTOMY AND RIGHT OOPHORECTOMY;  Surgeon: Florian Buff, MD;  Location: [facility];  Service: Gynecology;  Laterality: Right;  . OOPHORECTOMY Right   . WISDOM TOOTH EXTRACTION      Gynecologic History G1P1001  Patient's last menstrual period was 09/27/2016. Contraception: OCP (estrogen/progesterone) Last Pap: 08/26/15. Results were: normal,  wants to repeat today Last mammogram: never. Results were: n/a Last TCS: never  Obstetric History OB History  Gravida Para Term Preterm AB Living  1 1 1     1   SAB TAB Ectopic Multiple Live Births        0 1    # Outcome Date GA Lbr Len/2nd Weight Sex Delivery Anes PTL Lv  1 Term 04/04/15 [redacted]w[redacted]d 16:08 / 02:41 7 lb 1.9 oz (3.229 kg) M Vag-Spont EPI  LIV      Current Medications Current Outpatient Prescriptions on File Prior to Visit  Medication Sig Dispense Refill  . LO LOESTRIN FE 1 MG-10 MCG / 10 MCG tablet TAKE ONE TABLET BY MOUTH ONCE DAILY 84 tablet 0  . ranitidine (ZANTAC) 150 MG capsule Take 1 capsule (150 mg total) by mouth 2 (two) times daily. 60 capsule 0  . Probiotic Product (PROBIOTIC-10 PO) Take 1 capsule by mouth daily.     No current facility-administered medications on file prior to visit.     Review of Systems Patient denies any headaches, blurred vision, shortness of breath, chest pain, abdominal pain, problems with bowel movements, urination, or intercourse.  Objective:  BP 110/60   Pulse 80   Ht 5\' 4"  (1.626 m)   Wt 196 lb (88.9 kg)   LMP 09/27/2016   BMI 33.64 kg/m  Physical Exam  General:  Well developed, well nourished, no acute distress. She is alert and oriented x3. Skin:  Warm and dry Neck:  Midline trachea, no thyromegaly or nodules Cardiovascular: Regular rate and rhythm, no murmur heard Lungs:  Effort normal, all lung  fields clear to auscultation bilaterally Breasts: Very large pendulous breasts. No dominant palpable mass, retraction, or nipple discharge Abdomen:  Soft, non tender, no hepatosplenomegaly or masses; 'hard bump' she was describing feels like small reducible supra umbilical hernia Pelvic:  External genitalia is normal in appearance.  The vagina is normal in appearance. The cervix is bulbous, no CMT.  Thin prep pap is done w/ HR HPV cotesting. Uterus is felt to be normal size, shape, and contour.  No adnexal masses or tenderness  noted. Extremities:  No swelling or varicosities noted Psych:  She has a normal mood and affect  Assessment:   Healthy well-woman exam 79mths s/p removal borderline malignant Rt ovarian tumor/and oophorectomy Small reducible supraumbilical hernia  Plan:  Desires genetic counseling for possible hereditary cancer testing- will send referral today, let us know if doesn't hear from them w/in 1wk F/U Sept for CT per Dr. Elonda Husky- pt states he told her to call in Sept so he can get scheduled- she has this written on her calendar F/U 44yr for physical, or sooner if needed Refilled LoLoestrin, gave info on copay card, if still high out of pocket, call and let me know Mammogram @31yo  or sooner if problems Colonoscopy @31yo  or sooner if problems  Tawnya Crook CNM, [facility] 10/07/2016 4:01 PM

## 2016-10-11 ENCOUNTER — Other Ambulatory Visit: Payer: Self-pay | Admitting: Women's Health

## 2016-10-11 DIAGNOSIS — D3910 Neoplasm of uncertain behavior of unspecified ovary: Secondary | ICD-10-CM

## 2016-10-11 DIAGNOSIS — Z803 Family history of malignant neoplasm of breast: Secondary | ICD-10-CM

## 2016-10-11 DIAGNOSIS — Z800 Family history of malignant neoplasm of digestive organs: Secondary | ICD-10-CM

## 2016-10-11 LAB — CYTOLOGY - PAP
Diagnosis: NEGATIVE
HPV: DETECTED — AB

## 2016-10-15 ENCOUNTER — Telehealth: Payer: Self-pay | Admitting: Women's Health

## 2016-10-15 ENCOUNTER — Encounter: Payer: Self-pay | Admitting: Women's Health

## 2016-10-15 DIAGNOSIS — R87810 Cervical high risk human papillomavirus (HPV) DNA test positive: Secondary | ICD-10-CM | POA: Insufficient documentation

## 2016-10-15 NOTE — Telephone Encounter (Signed)
LM for pt to return call. 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 Needs repeat in 58yr.  Roma Schanz, CNM, WHNP-BC 10/15/2016 1:22 PM

## 2016-10-19 ENCOUNTER — Encounter: Payer: Self-pay | Admitting: Women's Health

## 2016-10-19 ENCOUNTER — Other Ambulatory Visit: Payer: Self-pay | Admitting: Physician Assistant

## 2016-10-19 MED ORDER — HYDROCORTISONE ACE-PRAMOXINE 1-1 % RE CREA: 1.0000 "application " | TOPICAL_CREAM | Freq: Two times a day (BID) | RECTAL | 0 refills | Status: DC

## 2017-01-19 ENCOUNTER — Encounter: Payer: Self-pay | Admitting: Adult Health

## 2017-01-19 ENCOUNTER — Ambulatory Visit (INDEPENDENT_AMBULATORY_CARE_PROVIDER_SITE_OTHER): Payer: Managed Care, Other (non HMO) | Admitting: Adult Health

## 2017-01-19 VITALS — BP 108/64 | HR 76 | Ht 64.0 in | Wt 197.0 lb

## 2017-01-19 DIAGNOSIS — N760 Acute vaginitis: Secondary | ICD-10-CM | POA: Diagnosis not present

## 2017-01-19 DIAGNOSIS — R350 Frequency of micturition: Secondary | ICD-10-CM | POA: Diagnosis not present

## 2017-01-19 DIAGNOSIS — B9689 Other specified bacterial agents as the cause of diseases classified elsewhere: Secondary | ICD-10-CM | POA: Insufficient documentation

## 2017-01-19 DIAGNOSIS — L298 Other pruritus: Secondary | ICD-10-CM

## 2017-01-19 DIAGNOSIS — N898 Other specified noninflammatory disorders of vagina: Secondary | ICD-10-CM | POA: Diagnosis not present

## 2017-01-19 LAB — POCT URINALYSIS DIPSTICK
Glucose, UA: NEGATIVE
Ketones, UA: NEGATIVE
NITRITE UA: NEGATIVE
PROTEIN UA: NEGATIVE
RBC UA: NEGATIVE

## 2017-01-19 LAB — POCT WET PREP (WET MOUNT)
CLUE CELLS WET PREP WHIFF POC: POSITIVE
WBC WET PREP: POSITIVE

## 2017-01-19 MED ORDER — METRONIDAZOLE 500 MG PO TABS: 500.0000 mg | ORAL_TABLET | Freq: Two times a day (BID) | ORAL | 0 refills | Status: DC

## 2017-01-19 NOTE — Patient Instructions (Addendum)
Bacterial Vaginosis Bacterial vaginosis is a vaginal infection that occurs when the normal balance of bacteria in the vagina is disrupted. It results from an overgrowth of certain bacteria. This is the most common vaginal infection among women ages 15-44. Because bacterial vaginosis increases your risk for STIs (sexually transmitted infections), getting treated can help reduce your risk for chlamydia, gonorrhea, herpes, and HIV (human immunodeficiency virus). Treatment is also important for preventing complications in pregnant women, because this condition can cause an early (premature) delivery. What are the causes? This condition is caused by an increase in harmful bacteria that are normally present in small amounts in the vagina. However, the reason that the condition develops is not fully understood. What increases the risk? The following factors may make you more likely to develop this condition:  Having a new sexual partner or multiple sexual partners.  Having unprotected sex.  Douching.  Having an intrauterine device (IUD).  Smoking.  Drug and alcohol abuse.  Taking certain antibiotic medicines.  Being pregnant.  You cannot get bacterial vaginosis from toilet seats, bedding, swimming pools, or contact with objects around you. What are the signs or symptoms? Symptoms of this condition include:  Grey or white vaginal discharge. The discharge can also be watery or foamy.  A fish-like odor with discharge, especially after sexual intercourse or during menstruation.  Itching in and around the vagina.  Burning or pain with urination.  Some women with bacterial vaginosis have no signs or symptoms. How is this diagnosed? This condition is diagnosed based on:  Your medical history.  A physical exam of the vagina.  Testing a sample of vaginal fluid under a microscope to look for a large amount of bad bacteria or abnormal cells. Your health care provider may use a cotton swab  or a small wooden spatula to collect the sample.  How is this treated? This condition is treated with antibiotics. These may be given as a pill, a vaginal cream, or a medicine that is put into the vagina (suppository). If the condition comes back after treatment, a second round of antibiotics may be needed. Follow these instructions at home: Medicines  Take over-the-counter and prescription medicines only as told by your health care provider.  Take or use your antibiotic as told by your health care provider. Do not stop taking or using the antibiotic even if you start to feel better. General instructions  If you have a female sexual partner, tell her that you have a vaginal infection. She should see her health care provider and be treated if she has symptoms. If you have a female sexual partner, he does not need treatment.  During treatment: ? Avoid sexual activity until you finish treatment. ? Do not douche. ? Avoid alcohol as directed by your health care provider. ? Avoid breastfeeding as directed by your health care provider.  Drink enough water and fluids to keep your urine clear or pale yellow.  Keep the area around your vagina and rectum clean. ? Wash the area daily with warm water. ? Wipe yourself from front to back after using the toilet.  Keep all follow-up visits as told by your health care provider. This is important. How is this prevented?  Do not douche.  Wash the outside of your vagina with warm water only.  Use protection when having sex. This includes latex condoms and dental dams.  Limit how many sexual partners you have. To help prevent bacterial vaginosis, it is best to have sex with just   one partner (monogamous).  Make sure you and your sexual partner are tested for STIs.  Wear cotton or cotton-lined underwear.  Avoid wearing tight pants and pantyhose, especially during summer.  Limit the amount of alcohol that you drink.  Do not use any products that  contain nicotine or tobacco, such as cigarettes and e-cigarettes. If you need help quitting, ask your health care provider.  Do not use illegal drugs. Where to find more information:  Centers for Disease Control and Prevention: AppraiserFraud.fi  American Sexual Health Association (ASHA): www.ashastd.org  [place] Department of Health and Financial controller, Office on Women's Health: DustingSprays.pl or SecuritiesCard.it Contact a health care provider if:  Your symptoms do not improve, even after treatment.  You have more discharge or pain when urinating.  You have a fever.  You have pain in your abdomen.  You have pain during sex.  You have vaginal bleeding between periods. Summary  Bacterial vaginosis is a vaginal infection that occurs when the normal balance of bacteria in the vagina is disrupted.  Because bacterial vaginosis increases your risk for STIs (sexually transmitted infections), getting treated can help reduce your risk for chlamydia, gonorrhea, herpes, and HIV (human immunodeficiency virus). Treatment is also important for preventing complications in pregnant women, because the condition can cause an early (premature) delivery.  This condition is treated with antibiotic medicines. These may be given as a pill, a vaginal cream, or a medicine that is put into the vagina (suppository). This information is not intended to replace advice given to you by your health care provider. Make sure you discuss any questions you have with your health care provider. Document Released: 06/07/2005 Document Revised: 02/21/2016 Document Reviewed: 02/21/2016 Elsevier Interactive Patient Education  2017 [place]. No alcohol

## 2017-01-19 NOTE — Progress Notes (Signed)
Subjective:     Patient ID: Teresa Bright, female   DOB: Oct 12, 1985, 31 y.o.   MRN: [id number]  HPI Teresa Bright is a 31 year old white female in complaining of vaginal discharge, with odor and itching and some urinary frequency.   Review of Systems +vaginal discharge  +vaginal odor +vaginl itching Urinary frequency  Reviewed past medical,surgical, social and family history. Reviewed medications and allergies.     Objective:   Physical Exam BP 108/64 (BP Location: Right Arm, Patient Position: Sitting, Cuff Size: Normal)   Pulse 76   Ht 5\' 4"  (1.626 m)   Wt 197 lb (89.4 kg)   LMP 12/24/2016 (Exact Date)   BMI 33.81 kg/m urine dipstick mod. [place], Skin warm and dry.Pelvic: external genitalia is normal in appearance no lesions, vagina: white discharge with odor,urethra has no lesions or masses noted, cervix:smooth and bulbous, uterus: normal size, shape and contour, non tender, no masses felt, adnexa: no masses or tenderness noted. Bladder is non tender and no masses felt. Wet prep: + for clue cells and +WBCs.   NO CVAT. Discussed BV and No tub baths with bombs or gels, separate razor, no douching, no thongs regularly.  Assessment:     1. BV (bacterial vaginosis)   2. Vaginal discharge   3. Vaginal odor   4. Vaginal itching   5. Urinary frequency       Plan:     Rx flagyl 500 mg 1 bid x 7 days, no alcohol, review handout on BV    Follow up prn

## 2017-02-03 ENCOUNTER — Encounter: Payer: Self-pay | Admitting: Obstetrics & Gynecology

## 2017-02-17 ENCOUNTER — Encounter: Payer: Self-pay | Admitting: Obstetrics & Gynecology

## 2017-02-18 ENCOUNTER — Other Ambulatory Visit: Payer: Self-pay | Admitting: Obstetrics & Gynecology

## 2017-02-18 DIAGNOSIS — D3911 Neoplasm of uncertain behavior of right ovary: Secondary | ICD-10-CM

## 2017-03-04 ENCOUNTER — Ambulatory Visit (HOSPITAL_COMMUNITY): Payer: Managed Care, Other (non HMO)

## 2017-03-11 ENCOUNTER — Ambulatory Visit (HOSPITAL_COMMUNITY)
Admission: RE | Admit: 2017-03-11 | Discharge: 2017-03-11 | Disposition: A | Discharge status: Home / Self Care | Payer: Managed Care, Other (non HMO) | Source: Ambulatory Visit | Attending: Obstetrics & Gynecology | Admitting: Obstetrics & Gynecology

## 2017-03-11 DIAGNOSIS — D3911 Neoplasm of uncertain behavior of right ovary: Secondary | ICD-10-CM | POA: Insufficient documentation

## 2017-03-11 MED ORDER — IOPAMIDOL (ISOVUE-300) INJECTION 61%
100.0000 mL | Freq: Once | INTRAVENOUS | Status: AC | PRN
  Administered 2017-03-11: 100 mL via INTRAVENOUS

## 2017-03-14 ENCOUNTER — Telehealth: Payer: Self-pay | Admitting: *Deleted

## 2017-03-14 NOTE — Telephone Encounter (Signed)
Patient called wanting Ct results. Please advise.

## 2017-03-15 ENCOUNTER — Encounter: Payer: Self-pay | Admitting: Obstetrics & Gynecology

## 2017-06-03 ENCOUNTER — Encounter: Payer: Self-pay | Admitting: Physician Assistant

## 2017-06-03 ENCOUNTER — Other Ambulatory Visit: Payer: Self-pay

## 2017-06-03 ENCOUNTER — Ambulatory Visit: Payer: Managed Care, Other (non HMO) | Admitting: Physician Assistant

## 2017-06-03 VITALS — BP 110/80 | HR 81 | Temp 97.7°F | Resp 14 | Ht 64.0 in | Wt 213.0 lb

## 2017-06-03 DIAGNOSIS — R300 Dysuria: Secondary | ICD-10-CM | POA: Diagnosis not present

## 2017-06-03 LAB — POCT URINALYSIS DIPSTICK
BILIRUBIN UA: NEGATIVE
Glucose, UA: NEGATIVE
KETONES UA: NEGATIVE
NITRITE UA: NEGATIVE
PH UA: 6 (ref 5.0–8.0)
PROTEIN UA: NEGATIVE
RBC UA: NEGATIVE
Spec Grav, UA: >=1.03 — AB (ref 1.010–1.025)
UROBILINOGEN UA: 0.2 U/dL

## 2017-06-03 MED ORDER — CEPHALEXIN 500 MG PO CAPS: 500.0000 mg | ORAL_CAPSULE | Freq: Two times a day (BID) | ORAL | 0 refills | Status: DC

## 2017-06-03 NOTE — Progress Notes (Signed)
JFH:[id number], Luanna Cole, PA-C Chief Complaint  Patient presents with  . Urinary Tract Infection    Current Issues:  Presents with 3 days of dysuria, urinary urgency and urinary frequency Associated symptoms include:  cloudy urine and urinary hesitancy  There is a previous history of of similar symptoms. Sexually active:  Yes with female.   No concern for STI.  Prior to Admission medications   Medication Sig Start Date End Date Taking? Authorizing Provider  Norethindrone-Ethinyl Estradiol-Fe Biphas (LO LOESTRIN FE) 1 MG-10 MCG / 10 MCG tablet Take 1 tablet by mouth daily. 10/07/16  Yes Roma Schanz, CNM   Review of Systems: ROS Pertinent ROS listed in the HPI  PE:  BP 110/80   Pulse 81   Temp 97.7 F (36.5 C) (Oral)   Resp 14   Ht 5\' 4"  (1.626 m)   Wt 213 lb (96.6 kg)   SpO2 99%   BMI 36.56 kg/m   General appearance: alert, cooperative, appears stated age and no distress Lungs: clear to auscultation bilaterally Heart: regular rate and rhythm, S1, S2 normal, no murmur, click, rub or gallop Abdomen: soft, non-tender; bowel sounds normal; no masses,  no organomegaly. Negative CVA tenderness.  Results for orders placed or performed in visit on 06/03/17  POCT Urinalysis Dipstick  Result Value Ref Range   Color, UA yellow    Clarity, UA cloudy    Glucose, UA negative    Bilirubin, UA negative    Ketones, UA negative    Spec Grav, UA >=1.030 (A) 1.010 - 1.025   Blood, UA negative    pH, UA 6.0 5.0 - 8.0   Protein, UA negative    Urobilinogen, UA 0.2 0.2 or 1.0 E.U./dL   Nitrite, UA negative    Leukocytes, UA Small (1+) (A) Negative   Appearance     Odor      Assessment and Plan:  1. Dysuria Urine dip with + LE. Culture sent. Classic symptomology. Start Keflex BID. Will alter based on culture results. Supportive measures and OTC medications reviewed. - POCT Urinalysis Dipstick

## 2017-06-03 NOTE — Patient Instructions (Addendum)
Your symptoms are consistent with a bladder infection, also called acute cystitis. Please take your antibiotic (Keflex) as directed until all pills are gone.  Stay very well hydrated.  Consider a daily probiotic (Align, Culturelle, or Activia) to help prevent stomach upset caused by the antibiotic.  Taking a probiotic daily may also help prevent recurrent UTIs.  Also consider taking AZO (Phenazopyridine) tablets to help decrease pain with urination.  I will call you with your urine testing results.  We will change antibiotics if indicated.  Call or return to clinic if symptoms are not resolved by completion of antibiotic.   Urinary Tract Infection A urinary tract infection (UTI) can occur any place along the urinary tract. The tract includes the kidneys, ureters, bladder, and urethra. A type of germ called bacteria often causes a UTI. UTIs are often helped with antibiotic medicine.  HOME CARE   If given, take antibiotics as told by your doctor. Finish them even if you start to feel better.  Drink enough fluids to keep your pee (urine) clear or pale yellow.  Avoid tea, drinks with caffeine, and bubbly (carbonated) drinks.  Pee often. Avoid holding your pee in for a long time.  Pee before and after having sex (intercourse).  Wipe from front to back after you poop (bowel movement) if you are a woman. Use each tissue only once. GET HELP RIGHT AWAY IF:   You have back pain.  You have lower belly (abdominal) pain.  You have chills.  You feel sick to your stomach (nauseous).  You throw up (vomit).  Your burning or discomfort with peeing does not go away.  You have a fever.  Your symptoms are not better in 3 days. MAKE SURE YOU:   Understand these instructions.  Will watch your condition.  Will get help right away if you are not doing well or get worse. Document Released: 11/24/2007 Document Revised: 03/01/2012 Document Reviewed: 01/06/2012 ExitCare Patient Information 2015  [employer]. This information is not intended to replace advice given to you by your health care provider. Make sure you discuss any questions you have with your health care provider.   

## 2017-06-04 LAB — URINE CULTURE
MICRO NUMBER:: 81409597
Result:: NO GROWTH
SPECIMEN QUALITY:: ADEQUATE

## 2017-06-06 ENCOUNTER — Encounter: Payer: Self-pay | Admitting: Physician Assistant

## 2017-06-20 ENCOUNTER — Other Ambulatory Visit: Payer: Self-pay | Admitting: Physician Assistant

## 2017-06-20 MED ORDER — BENZONATATE 100 MG PO CAPS: 100.0000 mg | ORAL_CAPSULE | Freq: Three times a day (TID) | ORAL | 0 refills | Status: DC | PRN

## 2017-09-02 ENCOUNTER — Other Ambulatory Visit: Payer: Self-pay | Admitting: Obstetrics & Gynecology

## 2017-09-02 DIAGNOSIS — N83209 Unspecified ovarian cyst, unspecified side: Secondary | ICD-10-CM

## 2017-09-05 ENCOUNTER — Other Ambulatory Visit: Payer: Managed Care, Other (non HMO)

## 2017-09-06 ENCOUNTER — Ambulatory Visit: Payer: Managed Care, Other (non HMO) | Admitting: Obstetrics & Gynecology

## 2017-09-06 ENCOUNTER — Other Ambulatory Visit: Payer: Managed Care, Other (non HMO)

## 2017-09-13 ENCOUNTER — Ambulatory Visit: Payer: Managed Care, Other (non HMO) | Admitting: Obstetrics & Gynecology

## 2017-09-13 ENCOUNTER — Encounter: Payer: Self-pay | Admitting: Obstetrics & Gynecology

## 2017-09-13 ENCOUNTER — Ambulatory Visit (INDEPENDENT_AMBULATORY_CARE_PROVIDER_SITE_OTHER): Payer: Managed Care, Other (non HMO)

## 2017-09-13 VITALS — BP 124/70 | HR 74 | Ht 64.0 in | Wt 213.5 lb

## 2017-09-13 DIAGNOSIS — D3911 Neoplasm of uncertain behavior of right ovary: Secondary | ICD-10-CM | POA: Diagnosis not present

## 2017-09-13 DIAGNOSIS — N83201 Unspecified ovarian cyst, right side: Secondary | ICD-10-CM | POA: Diagnosis not present

## 2017-09-13 DIAGNOSIS — N83209 Unspecified ovarian cyst, unspecified side: Secondary | ICD-10-CM

## 2017-09-13 NOTE — Progress Notes (Signed)
PELVIC US TA/TV:homogeneous anteverted uterus,wnl,normal left ovary,left ovary appears mobile,right oophorectomy,right adnexa wnl,EEC 2.1 mm,no free fluid,no pain during ultrasound

## 2017-09-13 NOTE — Progress Notes (Signed)
Follow up appointment for results  Chief Complaint  Patient presents with  . discuss [place]    Blood pressure 124/70, pulse 74, height 5\' 4"  (1.626 m), weight 213 lb 8 oz (96.8 kg), last menstrual period 09/10/2017.  US Transvaginal Non-ob  Result Date: 09/13/2017 GYNECOLOGIC SONOGRAM Teresa Bright is a 32 y.o. G1P1001 LMP 09/10/2017 she is here for a pelvic sonogram to f/u right ovarian tumor. Uterus                      8.5 x 4.4 x 7 cm, vol 134 ml,homogeneous anteverted uterus,wnl Endometrium          2.1 mm, symmetrical, wnl Right ovary              Right oophorectomy,right adnexa wnl Left ovary                3.2 x 2.6 x 3.4 cm, wnl No free fluid Technician Comments: PELVIC US TA/TV:homogeneous anteverted uterus,wnl,normal left ovary,left ovary appears mobile,right oophorectomy,right adnexa wnl,EEC 2.1 mm,no free fluid,no pain during ultrasound [employer] 09/13/2017 11:11 AM Clinical Impression and recommendations: I have reviewed the sonogram results above, combined with the patient's current clinical course, below are my impressions and any appropriate recommendations for management based on the sonographic findings. Normal uterus and endometrium, on OCP left ovary is normal Right ovary is surgically absent Florian Buff 09/13/2017 11:24 AM   US Pelvis Complete  Result Date: 09/13/2017 GYNECOLOGIC SONOGRAM Teresa Bright is a 32 y.o. G1P1001 LMP 09/10/2017 she is here for a pelvic sonogram to f/u right ovarian tumor. Uterus                      8.5 x 4.4 x 7 cm, vol 134 ml,homogeneous anteverted uterus,wnl Endometrium          2.1 mm, symmetrical, wnl Right ovary              Right oophorectomy,right adnexa wnl Left ovary                3.2 x 2.6 x 3.4 cm, wnl No free fluid Technician Comments: PELVIC US TA/TV:homogeneous anteverted uterus,wnl,normal left ovary,left ovary appears mobile,right oophorectomy,right adnexa wnl,EEC 2.1 mm,no free fluid,no pain during ultrasound [employer] 09/13/2017  11:11 AM Clinical Impression and recommendations: I have reviewed the sonogram results above, combined with the patient's current clinical course, below are my impressions and any appropriate recommendations for management based on the sonographic findings. Normal uterus and endometrium, on OCP left ovary is normal Right ovary is surgically absent Florian Buff 09/13/2017 11:24 AM    No symptoms, doing well  MEDS ordered this encounter: No orders of the defined types were placed in this encounter.   Orders for this encounter: No orders of the defined types were placed in this encounter.   Impression: Ovarian tumor of borderline malignancy, right    Plan: Continue surveillance every 6 months for 5 years  Follow Up: Return in about 6 months (around 03/16/2018) for Follow up, with Dr Elonda Husky.       Face to face time:  10 minutes  Greater than 50% of the visit time was spent in counseling and coordination of care with the patient.  The summary and outline of the counseling and care coordination is summarized in the note above.   All questions were answered.  Past Medical History:  Diagnosis Date  . GERD (gastroesophageal reflux disease)   .  History of chickenpox   . Migraine   . Pregnant 08/20/2014    Past Surgical History:  Procedure Laterality Date  . cysterectomy    . LAPAROSCOPIC OVARIAN CYSTECTOMY Right 03/03/2016   Procedure: LAPAROSCOPIC RIGHT OVARIAN CYSTECTOMY AND RIGHT OOPHORECTOMY;  Surgeon: Florian Buff, MD;  Location: [facility];  Service: Gynecology;  Laterality: Right;  . OOPHORECTOMY Right   . WISDOM TOOTH EXTRACTION      OB History    Gravida  1   Para  1   Term  1   Preterm      AB      Living  1     SAB      TAB      Ectopic      Multiple  0   Live Births  1           No Known Allergies  Social History   Socioeconomic History  . Marital status: Married    Spouse name: Not on file  . Number of children: Not on file  . Years  of education: Not on file  . Highest education level: Not on file  Occupational History  . Not on file  Social Needs  . Financial resource strain: Not on file  . Food insecurity:    Worry: Not on file    Inability: Not on file  . Transportation needs:    Medical: Not on file    Non-medical: Not on file  Tobacco Use  . Smoking status: Never Smoker  . Smokeless tobacco: Never Used  Substance and Sexual Activity  . Alcohol use: Yes    Alcohol/week: 0.0 oz    Comment: occassionally   . Drug use: No  . Sexual activity: Yes    Partners: Male    Birth control/protection: Pill  Lifestyle  . Physical activity:    Days per week: Not on file    Minutes per session: Not on file  . Stress: Not on file  Relationships  . Social connections:    Talks on phone: Not on file    Gets together: Not on file    Attends religious service: Not on file    Active member of club or organization: Not on file    Attends meetings of clubs or organizations: Not on file    Relationship status: Not on file  Other Topics Concern  . Not on file  Social History Narrative  . Not on file    Family History  Problem Relation Age of Onset  . Hypertension Mother   . Hyperlipidemia Mother   . Anxiety disorder Mother   . Healthy Father   . Cancer Maternal Grandmother        skin ; also intestinal malignancy in 10s  . Diabetes Maternal Grandmother   . Dementia Maternal Grandmother   . Stroke Maternal Grandfather   . Aneurysm Maternal Grandfather        brain  . Cancer Paternal Grandmother        bone and breast  . Mental illness Maternal Uncle   . Stroke Maternal Uncle

## 2017-10-10 ENCOUNTER — Encounter: Payer: Self-pay | Admitting: Women's Health

## 2017-10-10 ENCOUNTER — Other Ambulatory Visit (HOSPITAL_COMMUNITY)
Admission: RE | Admit: 2017-10-10 | Discharge: 2017-10-10 | Disposition: A | Discharge status: Home / Self Care | Payer: Managed Care, Other (non HMO) | Source: Ambulatory Visit | Attending: Obstetrics & Gynecology | Admitting: Obstetrics & Gynecology

## 2017-10-10 ENCOUNTER — Ambulatory Visit (INDEPENDENT_AMBULATORY_CARE_PROVIDER_SITE_OTHER): Payer: Managed Care, Other (non HMO) | Admitting: Women's Health

## 2017-10-10 VITALS — BP 120/80 | HR 95 | Ht 64.0 in | Wt 213.0 lb

## 2017-10-10 DIAGNOSIS — Z90721 Acquired absence of ovaries, unilateral: Secondary | ICD-10-CM | POA: Diagnosis not present

## 2017-10-10 DIAGNOSIS — Z833 Family history of diabetes mellitus: Secondary | ICD-10-CM

## 2017-10-10 DIAGNOSIS — Z1151 Encounter for screening for human papillomavirus (HPV): Secondary | ICD-10-CM | POA: Insufficient documentation

## 2017-10-10 DIAGNOSIS — Z01419 Encounter for gynecological examination (general) (routine) without abnormal findings: Secondary | ICD-10-CM | POA: Diagnosis not present

## 2017-10-10 DIAGNOSIS — Z8742 Personal history of other diseases of the female genital tract: Secondary | ICD-10-CM | POA: Diagnosis not present

## 2017-10-10 DIAGNOSIS — Z8342 Family history of familial hypercholesterolemia: Secondary | ICD-10-CM | POA: Diagnosis not present

## 2017-10-10 MED ORDER — NORETHIN-ETH ESTRAD-FE BIPHAS 1 MG-10 MCG / 10 MCG PO TABS: 1.0000 | ORAL_TABLET | Freq: Every day | ORAL | 3 refills | Status: DC

## 2017-10-10 NOTE — Progress Notes (Signed)
   WELL-WOMAN EXAMINATION Patient name: Teresa Bright MRN [id number]  Date of birth: 03-11-1986 Chief Complaint:   Gynecologic Exam (pap/physcial)  History of Present Illness:   Teresa Bright is a 32 y.o. G75P1001 Caucasian female being seen today for a routine well-woman exam.  Current complaints: none, had f/u pelvic u/s last month for h/o borderline malignant ovarian cyst s/p cytectomy & Rt oophorectomy Sept 2017, being followed q57mth x 40yrs. U/S 09/13/17 normal. Never went to see genetic counselor as she had been interested in doing last year, not sure she wants to right now, will let us know if she decides to do this. PGM breast cancer in 72s w/ metz to bone, MGM skin cancer.   PCP: Fernande Bras      does desire labs, including A1C & lipid panel, has been fasting. Has family h/o DM and high cholesterol.  Patient's last menstrual period was 10/07/2017. The current method of family planning is OCP (estrogen/progesterone) Last pap 10/07/16. 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 Results were: n/a Last colonoscopy: never. Results were: never  Review of Systems:   Pertinent items are noted in HPI Denies any headaches, blurred vision, fatigue, shortness of breath, chest pain, abdominal pain, abnormal vaginal discharge/itching/odor/irritation, problems with periods, bowel movements, urination, or intercourse unless otherwise stated above. Pertinent History Reviewed:  Reviewed past medical,surgical, social and family history.  Reviewed problem list, medications and allergies. Physical Assessment:   Vitals:   10/10/17 0831  BP: 120/80  Pulse: 95  Weight: 213 lb (96.6 kg)  Height: 5\' 4"  (1.626 m)  Body mass index is 36.56 kg/m.        Physical Examination:   General appearance - well appearing, and in no distress  Mental status - alert, oriented to person, place, and time  Psych:  She has a normal mood and affect  Skin - warm and dry, normal color, no suspicious  lesions noted  Chest - effort normal, all lung fields clear to auscultation bilaterally  Heart - normal rate and regular rhythm  Neck:  midline trachea, no thyromegaly or nodules  Breasts - breasts appear normal, no suspicious masses, no skin or nipple changes or  axillary nodes  Abdomen - soft, nontender, nondistended, no masses or organomegaly  Pelvic - VULVA: normal appearing vulva with no masses, tenderness or lesions  VAGINA: normal appearing vagina with normal color and discharge, on menses,no lesions  CERVIX: normal appearing cervix without discharge or lesions, no CMT  Thin prep pap is done w/ HR HPV cotesting  UTERUS: uterus is felt to be normal size, shape, consistency and nontender   ADNEXA: No adnexal masses or tenderness noted.  Extremities:  No swelling or varicosities noted  No results found for this or any previous visit (from the past 24 hour(s)).  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 CBC  . Comprehensive metabolic panel  . TSH  . Lipid panel  . Hemoglobin A1c    Follow-up: Return for Sept for f/u w/ [facility], then 9yr for physical.  Roma Schanz CNM, [facility] 10/10/2017 9:47 AM

## 2017-10-11 LAB — COMPREHENSIVE METABOLIC PANEL
ALBUMIN: 4.5 g/dL (ref 3.5–5.5)
ALT: 14 IU/L (ref 0–32)
AST: 12 IU/L (ref 0–40)
Albumin/Globulin Ratio: 2.1 (ref 1.2–2.2)
Alkaline Phosphatase: 66 IU/L (ref 39–117)
BILIRUBIN TOTAL: 0.6 mg/dL (ref 0.0–1.2)
BUN/Creatinine Ratio: 17 (ref 9–23)
BUN: 11 mg/dL (ref 6–20)
CHLORIDE: 105 mmol/L (ref 96–106)
CO2: 21 mmol/L (ref 20–29)
Calcium: 9.2 mg/dL (ref 8.7–10.2)
Creatinine, Ser: 0.65 mg/dL (ref 0.57–1.00)
GFR, EST AFRICAN AMERICAN: 136 mL/min/{1.73_m2} (ref >59)
GFR, EST NON AFRICAN AMERICAN: 118 mL/min/{1.73_m2} (ref >59)
Globulin, Total: 2.1 g/dL (ref 1.5–4.5)
Glucose: 96 mg/dL (ref 65–99)
Potassium: 4.5 mmol/L (ref 3.5–5.2)
Sodium: 142 mmol/L (ref 134–144)
TOTAL PROTEIN: 6.6 g/dL (ref 6.0–8.5)

## 2017-10-11 LAB — CBC
HEMOGLOBIN: 13.3 g/dL (ref 11.1–15.9)
Hematocrit: 41.3 % (ref 34.0–46.6)
MCH: 29.3 pg (ref 26.6–33.0)
MCHC: 32.2 g/dL (ref 31.5–35.7)
MCV: 91 fL (ref 79–97)
PLATELETS: 321 10*3/uL (ref 150–379)
RBC: 4.54 x10E6/uL (ref 3.77–5.28)
RDW: 13.5 % (ref 12.3–15.4)
WBC: 5.8 10*3/uL (ref 3.4–10.8)

## 2017-10-11 LAB — HEMOGLOBIN A1C
Est. average glucose Bld gHb Est-mCnc: 105 mg/dL
Hgb A1c MFr Bld: 5.3 % (ref 4.8–5.6)

## 2017-10-11 LAB — CYTOLOGY - PAP
DIAGNOSIS: NEGATIVE
HPV: NOT DETECTED

## 2017-10-11 LAB — LIPID PANEL
CHOL/HDL RATIO: 3.9 ratio (ref 0.0–4.4)
Cholesterol, Total: 191 mg/dL (ref 100–199)
HDL: 49 mg/dL (ref >39)
LDL Calculated: 128 mg/dL — ABNORMAL HIGH (ref 0–99)
TRIGLYCERIDES: 68 mg/dL (ref 0–149)
VLDL Cholesterol Cal: 14 mg/dL (ref 5–40)

## 2017-10-11 LAB — TSH: TSH: 0.617 u[IU]/mL (ref 0.450–4.500)

## 2018-01-19 ENCOUNTER — Telehealth: Payer: Self-pay | Admitting: Physician Assistant

## 2018-01-19 DIAGNOSIS — L03113 Cellulitis of right upper limb: Secondary | ICD-10-CM

## 2018-01-19 MED ORDER — CEPHALEXIN 500 MG PO CAPS: 500.0000 mg | ORAL_CAPSULE | Freq: Two times a day (BID) | ORAL | 0 refills | Status: AC

## 2018-01-19 NOTE — Telephone Encounter (Signed)
Patient with redness and swelling of arm, tender and hardened, after a bee sting 5 days ago. Has been assessed by this provider with noted concern for cellulitis. Rx Keflex sent to pharmacy. Will follow-up with patient.

## 2018-02-24 ENCOUNTER — Encounter: Payer: Self-pay | Admitting: *Deleted

## 2018-02-24 ENCOUNTER — Other Ambulatory Visit: Payer: Self-pay | Admitting: Obstetrics & Gynecology

## 2018-02-24 ENCOUNTER — Telehealth: Payer: Self-pay | Admitting: *Deleted

## 2018-02-24 DIAGNOSIS — D3911 Neoplasm of uncertain behavior of right ovary: Secondary | ICD-10-CM

## 2018-02-24 NOTE — Telephone Encounter (Signed)
OK schedule BUN/CR before CT

## 2018-02-24 NOTE — Telephone Encounter (Signed)
CT scan scheduled for 9/24 at 10am.  Will need BUN/CR drawn within 6 weeks of CT. Will send patient a mychart message.

## 2018-03-10 ENCOUNTER — Other Ambulatory Visit: Payer: Managed Care, Other (non HMO)

## 2018-03-10 DIAGNOSIS — D3911 Neoplasm of uncertain behavior of right ovary: Secondary | ICD-10-CM

## 2018-03-11 LAB — BUN+CREAT
BUN / CREAT RATIO: 14 (ref 9–23)
BUN: 10 mg/dL (ref 6–20)
Creatinine, Ser: 0.72 mg/dL (ref 0.57–1.00)
GFR calc non Af Amer: 111 mL/min/{1.73_m2} (ref >59)
GFR, EST AFRICAN AMERICAN: 128 mL/min/{1.73_m2} (ref >59)

## 2018-03-14 ENCOUNTER — Ambulatory Visit (HOSPITAL_COMMUNITY): Payer: Managed Care, Other (non HMO)

## 2018-03-27 ENCOUNTER — Encounter: Payer: Self-pay | Admitting: *Deleted

## 2018-03-27 NOTE — Progress Notes (Signed)
CT scheduled for 10/23 8am 7:45 npo 4 hours prior. Pick up prep

## 2018-04-12 ENCOUNTER — Ambulatory Visit (HOSPITAL_COMMUNITY)
Admission: RE | Admit: 2018-04-12 | Discharge: 2018-04-12 | Disposition: A | Discharge status: Home / Self Care | Payer: Managed Care, Other (non HMO) | Source: Ambulatory Visit | Attending: Obstetrics & Gynecology | Admitting: Obstetrics & Gynecology

## 2018-04-12 DIAGNOSIS — D3911 Neoplasm of uncertain behavior of right ovary: Secondary | ICD-10-CM | POA: Insufficient documentation

## 2018-04-12 MED ORDER — IOPAMIDOL (ISOVUE-300) INJECTION 61%
100.0000 mL | Freq: Once | INTRAVENOUS | Status: AC | PRN
  Administered 2018-04-12: 100 mL via INTRAVENOUS

## 2018-05-16 ENCOUNTER — Ambulatory Visit: Payer: Managed Care, Other (non HMO) | Admitting: Women's Health

## 2018-05-16 ENCOUNTER — Encounter: Payer: Self-pay | Admitting: Women's Health

## 2018-05-16 VITALS — BP 132/89 | HR 91 | Ht 64.0 in | Wt 223.0 lb

## 2018-05-16 DIAGNOSIS — Z3491 Encounter for supervision of normal pregnancy, unspecified, first trimester: Secondary | ICD-10-CM

## 2018-05-16 DIAGNOSIS — Z3201 Encounter for pregnancy test, result positive: Secondary | ICD-10-CM

## 2018-05-16 LAB — POCT URINE PREGNANCY: PREG TEST UR: POSITIVE — AB

## 2018-05-16 MED ORDER — CITRANATAL ASSURE 35-1 & 300 MG PO MISC: ORAL | 11 refills | Status: DC

## 2018-05-16 NOTE — Patient Instructions (Signed)
Teresa Bright, I greatly value your feedback.  If you receive a survey following your visit with [place] today, we appreciate you taking the time to fill it out.  Thanks, Knute Neu, CNM, WHNP-BC   Nausea & Vomiting  Have saltine crackers or pretzels by your bed and eat a few bites before you raise your head out of bed in the morning  Eat small frequent meals throughout the day instead of large meals  Drink plenty of fluids throughout the day to stay hydrated, just don't drink a lot of fluids with your meals.  This can make your stomach fill up faster making you feel sick  Do not brush your teeth right after you eat  Products with real ginger are good for nausea, like ginger ale and ginger hard candy Make sure it says made with real ginger!  Sucking on sour candy like lemon heads is also good for nausea  If your prenatal vitamins make you nauseated, take them at night so you will sleep through the nausea  Sea Bands  If you feel like you need medicine for the nausea & vomiting please let us know  If you are unable to keep any fluids or food down please let us know   Constipation  Drink plenty of fluid, preferably water, throughout the day  Eat foods high in fiber such as fruits, vegetables, and grains  Exercise, such as walking, is a good way to keep your bowels regular  Drink warm fluids, especially warm prune juice, or decaf coffee  Eat a 1/2 cup of real oatmeal (not instant), 1/2 cup applesauce, and 1/2-1 cup warm prune juice every day  If needed, you may take Colace (docusate sodium) stool softener once or twice a day to help keep the stool soft. If you are pregnant, wait until you are out of your first trimester (12-14 weeks of pregnancy)  If you still are having problems with constipation, you may take Miralax once daily as needed to help keep your bowels regular.  If you are pregnant, wait until you are out of your first trimester (12-14 weeks of pregnancy)   First  Trimester of Pregnancy The first trimester of pregnancy is from week 1 until the end of week 12 (months 1 through 3). A week after a sperm fertilizes an egg, the egg will implant on the wall of the uterus. This embryo will begin to develop into a baby. Genes from you and your partner are forming the baby. The female genes determine whether the baby is a boy or a girl. At 6-8 weeks, the eyes and face are formed, and the heartbeat can be seen on ultrasound. At the end of 12 weeks, all the baby's organs are formed.  Now that you are pregnant, you will want to do everything you can to have a healthy baby. Two of the most important things are to get good prenatal care and to follow your health care provider's instructions. Prenatal care is all the medical care you receive before the baby's birth. This care will help prevent, find, and treat any problems during the pregnancy and childbirth. BODY CHANGES Your body goes through many changes during pregnancy. The changes vary from woman to woman.   You may gain or lose a couple of pounds at first.  You may feel sick to your stomach (nauseous) and throw up (vomit). If the vomiting is uncontrollable, call your health care provider.  You may tire easily.  You may develop headaches that  can be relieved by medicines approved by your health care provider.  You may urinate more often. Painful urination may mean you have a bladder infection.  You may develop heartburn as a result of your pregnancy.  You may develop constipation because certain hormones are causing the muscles that push waste through your intestines to slow down.  You may develop hemorrhoids or swollen, bulging veins (varicose veins).  Your breasts may begin to grow larger and become tender. Your nipples may stick out more, and the tissue that surrounds them (areola) may become darker.  Your gums may bleed and may be sensitive to brushing and flossing.  Dark spots or blotches (chloasma, mask  of pregnancy) may develop on your face. This will likely fade after the baby is born.  Your menstrual periods will stop.  You may have a loss of appetite.  You may develop cravings for certain kinds of food.  You may have changes in your emotions from day to day, such as being excited to be pregnant or being concerned that something may go wrong with the pregnancy and baby.  You may have more vivid and strange dreams.  You may have changes in your hair. These can include thickening of your hair, rapid growth, and changes in texture. Some women also have hair loss during or after pregnancy, or hair that feels dry or thin. Your hair will most likely return to normal after your baby is born. WHAT TO EXPECT AT YOUR PRENATAL VISITS During a routine prenatal visit:  You will be weighed to make sure you and the baby are growing normally.  Your blood pressure will be taken.  Your abdomen will be measured to track your baby's growth.  The fetal heartbeat will be listened to starting around week 10 or 12 of your pregnancy.  Test results from any previous visits will be discussed. Your health care provider may ask you:  How you are feeling.  If you are feeling the baby move.  If you have had any abnormal symptoms, such as leaking fluid, bleeding, severe headaches, or abdominal cramping.  If you have any questions. Other tests that may be performed during your first trimester include:  Blood tests to find your blood type and to check for the presence of any previous infections. They will also be used to check for low iron levels (anemia) and Rh antibodies. Later in the pregnancy, blood tests for diabetes will be done along with other tests if problems develop.  Urine tests to check for infections, diabetes, or protein in the urine.  An ultrasound to confirm the proper growth and development of the baby.  An amniocentesis to check for possible genetic problems.  Fetal screens for spina  bifida and Down syndrome.  You may need other tests to make sure you and the baby are doing well. HOME CARE INSTRUCTIONS  Medicines  Follow your health care provider's instructions regarding medicine use. Specific medicines may be either safe or unsafe to take during pregnancy.  Take your prenatal vitamins as directed.  If you develop constipation, try taking a stool softener if your health care provider approves. Diet  Eat regular, well-balanced meals. Choose a variety of foods, such as meat or vegetable-based protein, fish, milk and low-fat dairy products, vegetables, fruits, and whole grain breads and cereals. Your health care provider will help you determine the amount of weight gain that is right for you.  Avoid raw meat and uncooked cheese. These carry germs that can cause  birth defects in the baby.  Eating four or five small meals rather than three large meals a day may help relieve nausea and vomiting. If you start to feel nauseous, eating a few soda crackers can be helpful. Drinking liquids between meals instead of during meals also seems to help nausea and vomiting.  If you develop constipation, eat more high-fiber foods, such as fresh vegetables or fruit and whole grains. Drink enough fluids to keep your urine clear or pale yellow. Activity and Exercise  Exercise only as directed by your health care provider. Exercising will help you:  Control your weight.  Stay in shape.  Be prepared for labor and delivery.  Experiencing pain or cramping in the lower abdomen or low back is a good sign that you should stop exercising. Check with your health care provider before continuing normal exercises.  Try to avoid standing for long periods of time. Move your legs often if you must stand in one place for a long time.  Avoid heavy lifting.  Wear low-heeled shoes, and practice good posture.  You may continue to have sex unless your health care provider directs you  otherwise. Relief of Pain or Discomfort  Wear a good support bra for breast tenderness.   Take warm sitz baths to soothe any pain or discomfort caused by hemorrhoids. Use hemorrhoid cream if your health care provider approves.   Rest with your legs elevated if you have leg cramps or low back pain.  If you develop varicose veins in your legs, wear support hose. Elevate your feet for 15 minutes, 3-4 times a day. Limit salt in your diet. Prenatal Care  Schedule your prenatal visits by the twelfth week of pregnancy. They are usually scheduled monthly at first, then more often in the last 2 months before delivery.  Write down your questions. Take them to your prenatal visits.  Keep all your prenatal visits as directed by your health care provider. Safety  Wear your seat belt at all times when driving.  Make a list of emergency phone numbers, including numbers for family, friends, the hospital, and police and fire departments. General Tips  Ask your health care provider for a referral to a local prenatal education class. Begin classes no later than at the beginning of month 6 of your pregnancy.  Ask for help if you have counseling or nutritional needs during pregnancy. Your health care provider can offer advice or refer you to specialists for help with various needs.  Do not use hot tubs, steam rooms, or saunas.  Do not douche or use tampons or scented sanitary pads.  Do not cross your legs for long periods of time.  Avoid cat litter boxes and soil used by cats. These carry germs that can cause birth defects in the baby and possibly loss of the fetus by miscarriage or stillbirth.  Avoid all smoking, herbs, alcohol, and medicines not prescribed by your health care provider. Chemicals in these affect the formation and growth of the baby.  Schedule a dentist appointment. At home, brush your teeth with a soft toothbrush and be gentle when you floss. SEEK MEDICAL CARE IF:   You have  dizziness.  You have mild pelvic cramps, pelvic pressure, or nagging pain in the abdominal area.  You have persistent nausea, vomiting, or diarrhea.  You have a bad smelling vaginal discharge.  You have pain with urination.  You notice increased swelling in your face, hands, legs, or ankles. SEEK IMMEDIATE MEDICAL CARE IF:  You have a fever.  You are leaking fluid from your vagina.  You have spotting or bleeding from your vagina.  You have severe abdominal cramping or pain.  You have rapid weight gain or loss.  You vomit blood or material that looks like coffee grounds.  You are exposed to [place] measles and have never had them.  You are exposed to fifth disease or chickenpox.  You develop a severe headache.  You have shortness of breath.  You have any kind of trauma, such as from a fall or a car accident. Document Released: 06/01/2001 Document Revised: 10/22/2013 Document Reviewed: 04/17/2013 [facility] Patient Information 2015 [place], [place]. This information is not intended to replace advice given to you by your health care provider. Make sure you discuss any questions you have with your health care provider.

## 2018-05-16 NOTE — Progress Notes (Signed)
   GYN VISIT Patient name: Teresa Bright MRN [id number]  Date of birth: [birth year] Chief Complaint:   Possible Pregnancy (has had 2 + UPT at home )  History of Present Illness:   Teresa Bright is a 32 y.o. G21P1001 Caucasian female being seen today for +HPT x 2. Unsure of LMP, thinks it was either beginning of Oct or Nov.  Not taking pnv. No other sx.     Patient's last menstrual period was 03/21/2018 (approximate). Review of Systems:   Pertinent items are noted in HPI Denies fever/chills, dizziness, headaches, visual disturbances, fatigue, shortness of breath, chest pain, abdominal pain, vomiting, abnormal vaginal discharge/itching/odor/irritation, problems with periods, bowel movements, urination, or intercourse unless otherwise stated above.  Pertinent History Reviewed:  Reviewed past medical,surgical, social, obstetrical and family history.  Reviewed problem list, medications and allergies. Physical Assessment:   Vitals:   05/16/18 1134  BP: 132/89  Pulse: 91  Weight: 223 lb (101.2 kg)  Height: 5\' 4"  (1.626 m)  Body mass index is 38.28 kg/m.       Physical Examination:   General appearance: alert, well appearing, and in no distress  Mental status: alert, oriented to person, place, and time  Skin: warm & dry   Cardiovascular: normal heart rate noted  Respiratory: normal respiratory effort, no distress  Abdomen: soft, non-tender   Pelvic: examination not indicated  Extremities: no edema   Informal transabdominal u/s: fetal pole appears to be ~4-5wks  Results for orders placed or performed in visit on 05/16/18 (from the past 24 hour(s))  POCT urine pregnancy   Collection Time: 05/16/18 11:38 AM  Result Value Ref Range   Preg Test, Ur Positive (A) Negative    Assessment & Plan:  1) Pregnant> uncertain LMP, will get dating u/s in 2wks, rx pnv  Meds:  Meds ordered this encounter  Medications  . Prenat w/o [id number] (CITRANATAL ASSURE) 35-1 & 300 MG tablet    Sig: One tablet and one capsule daily    Dispense:  60 tablet    Refill:  11    Order Specific Question:   Supervising Provider    Answer:   Tania Ade H [[id number]]    Orders Placed This Encounter  Procedures  . US OB Comp Less 14 Wks  . POCT urine pregnancy    Return in about 2 weeks (around 05/30/2018) for dating u/s.  Keddie, [facility] 05/16/2018 12:11 PM

## 2018-05-30 ENCOUNTER — Other Ambulatory Visit: Payer: Managed Care, Other (non HMO)

## 2018-05-30 ENCOUNTER — Ambulatory Visit (INDEPENDENT_AMBULATORY_CARE_PROVIDER_SITE_OTHER): Payer: Managed Care, Other (non HMO)

## 2018-05-30 ENCOUNTER — Other Ambulatory Visit: Payer: Self-pay | Admitting: Women's Health

## 2018-05-30 DIAGNOSIS — Z3491 Encounter for supervision of normal pregnancy, unspecified, first trimester: Secondary | ICD-10-CM

## 2018-05-30 DIAGNOSIS — O3680X0 Pregnancy with inconclusive fetal viability, not applicable or unspecified: Secondary | ICD-10-CM

## 2018-05-30 NOTE — Progress Notes (Signed)
[place] TA/TV: 7+3 wks GS,no YS or fetal pole visualized,right oophorectomy,right adnexa wnl,normal left ovary,pt will have labs today,per Maudie Mercury

## 2018-05-31 LAB — BETA HCG QUANT (REF LAB): HCG QUANT: 4823 m[IU]/mL

## 2018-06-01 ENCOUNTER — Other Ambulatory Visit: Payer: Managed Care, Other (non HMO)

## 2018-06-01 ENCOUNTER — Other Ambulatory Visit: Payer: Self-pay | Admitting: Women's Health

## 2018-06-01 DIAGNOSIS — Z3490 Encounter for supervision of normal pregnancy, unspecified, unspecified trimester: Secondary | ICD-10-CM

## 2018-06-01 NOTE — Addendum Note (Signed)
Addended by: Octaviano Glow on: 06/01/2018 09:18 AM   Modules accepted: Orders

## 2018-06-02 ENCOUNTER — Telehealth: Payer: Self-pay | Admitting: Adult Health

## 2018-06-02 DIAGNOSIS — O209 Hemorrhage in early pregnancy, unspecified: Secondary | ICD-10-CM

## 2018-06-02 LAB — BETA HCG QUANT (REF LAB): hCG Quant: 4667 m[IU]/mL

## 2018-06-02 NOTE — Telephone Encounter (Signed)
Pt aware that [facility] has dropped from [place] on 05/30/18 to [phone] on 06/01/18 and she is still bleeding, will recheck [facility] in am, but this may be miscarriage. Blood type is O+.

## 2018-06-04 LAB — BETA HCG QUANT (REF LAB): hCG Quant: 874 m[IU]/mL

## 2018-06-08 ENCOUNTER — Other Ambulatory Visit: Payer: Self-pay | Admitting: Women's Health

## 2018-06-08 DIAGNOSIS — O039 Complete or unspecified spontaneous abortion without complication: Secondary | ICD-10-CM

## 2018-06-16 ENCOUNTER — Other Ambulatory Visit: Payer: Managed Care, Other (non HMO)

## 2018-06-17 LAB — BETA HCG QUANT (REF LAB): hCG Quant: 3 m[IU]/mL

## 2018-09-08 ENCOUNTER — Other Ambulatory Visit: Payer: Managed Care, Other (non HMO)

## 2018-09-08 ENCOUNTER — Ambulatory Visit: Payer: Managed Care, Other (non HMO) | Admitting: Obstetrics & Gynecology

## 2018-10-20 ENCOUNTER — Other Ambulatory Visit: Payer: Managed Care, Other (non HMO)

## 2018-10-20 ENCOUNTER — Ambulatory Visit: Payer: Managed Care, Other (non HMO) | Admitting: Obstetrics & Gynecology

## 2018-11-06 ENCOUNTER — Other Ambulatory Visit: Payer: Self-pay | Admitting: Women's Health

## 2018-11-06 MED ORDER — LO LOESTRIN FE 1 MG-10 MCG / 10 MCG PO TABS: 1.0000 | ORAL_TABLET | Freq: Every day | ORAL | 3 refills | Status: DC

## 2018-11-24 ENCOUNTER — Other Ambulatory Visit: Payer: Self-pay | Admitting: Obstetrics & Gynecology

## 2018-11-24 ENCOUNTER — Encounter: Payer: Self-pay | Admitting: *Deleted

## 2018-11-24 DIAGNOSIS — Z8742 Personal history of other diseases of the female genital tract: Secondary | ICD-10-CM

## 2018-11-27 ENCOUNTER — Encounter: Payer: Self-pay | Admitting: Obstetrics & Gynecology

## 2018-11-27 ENCOUNTER — Ambulatory Visit (INDEPENDENT_AMBULATORY_CARE_PROVIDER_SITE_OTHER): Payer: Managed Care, Other (non HMO) | Admitting: Obstetrics & Gynecology

## 2018-11-27 ENCOUNTER — Ambulatory Visit (INDEPENDENT_AMBULATORY_CARE_PROVIDER_SITE_OTHER): Payer: Managed Care, Other (non HMO)

## 2018-11-27 ENCOUNTER — Other Ambulatory Visit: Payer: Self-pay

## 2018-11-27 VITALS — BP 130/88 | HR 70 | Ht 64.0 in | Wt 220.0 lb

## 2018-11-27 DIAGNOSIS — Z8742 Personal history of other diseases of the female genital tract: Secondary | ICD-10-CM | POA: Diagnosis not present

## 2018-11-27 DIAGNOSIS — D3911 Neoplasm of uncertain behavior of right ovary: Secondary | ICD-10-CM

## 2018-11-27 NOTE — Progress Notes (Signed)
Follow up appointment for results  Chief Complaint  Patient presents with  . Follow-up    U/S    Blood pressure 130/88, pulse 70, height 5\' 4"  (1.626 m), weight 220 lb (99.8 kg), last menstrual period 11/05/2017, unknown if currently breastfeeding.  US Transvaginal Non-ob  Result Date: 11/27/2018 GYNECOLOGIC SONOGRAM Teresa Bright is a 33 y.o. G2P1001 LMP 11/06/2018,she is here for a pelvic sonogram f/u right ovarian tumor,borderline malignancy. Uterus                      7.1 x 5.1 x 5.4 cm, Total uterine volume 103 cc, homogeneous anteverted uterus,wnl Endometrium          3.3 mm, symmetrical, wnl Right ovary             right oophorectomy,right adnexa wnl Left ovary                3.5 x 2.9 x 3.8 cm, left ovarian cyst w/a septation vs two small simple cysts 2.8 x 2.1 x 1.8 cm No free fluid Technician Comments: PELVIC US TA/TV: homogeneous anteverted uterus,wnl,EEC 3.3 mm,right oophorectomy,right adnexa wnl,left ovarian cyst w/a septation vs two small simple cysts 2.8 x 2.1 x 1.8 cm,ovary appears mobile,no free fluid,no pain during ultrasound [employer] 11/27/2018 9:10 AM Clinical Impression and recommendations: I have reviewed the sonogram results above, combined with the patient's current clinical course, below are my impressions and any appropriate recommendations for management based on the sonographic findings. Uterus and endometrium are normal Right ovary surgically absent Left ovary with physiological cysts present, no evidence of atypical ovarian neoplasm Florian Buff 11/27/2018 9:27 AM   US Pelvis Complete  Result Date: 11/27/2018 GYNECOLOGIC SONOGRAM Teresa Bright is a 33 y.o. G2P1001 LMP 11/06/2018,she is here for a pelvic sonogram f/u right ovarian tumor,borderline malignancy. Uterus                      7.1 x 5.1 x 5.4 cm, Total uterine volume 103 cc, homogeneous anteverted uterus,wnl Endometrium          3.3 mm, symmetrical, wnl Right ovary             right oophorectomy,right adnexa wnl  Left ovary                3.5 x 2.9 x 3.8 cm, left ovarian cyst w/a septation vs two small simple cysts 2.8 x 2.1 x 1.8 cm No free fluid Technician Comments: PELVIC US TA/TV: homogeneous anteverted uterus,wnl,EEC 3.3 mm,right oophorectomy,right adnexa wnl,left ovarian cyst w/a septation vs two small simple cysts 2.8 x 2.1 x 1.8 cm,ovary appears mobile,no free fluid,no pain during ultrasound [employer] 11/27/2018 9:10 AM Clinical Impression and recommendations: I have reviewed the sonogram results above, combined with the patient's current clinical course, below are my impressions and any appropriate recommendations for management based on the sonographic findings. Uterus and endometrium are normal Right ovary surgically absent Left ovary with physiological cysts present, no evidence of atypical ovarian neoplasm Florian Buff 11/27/2018 9:27 AM      MEDS ordered this encounter: No orders of the defined types were placed in this encounter.   Orders for this encounter: No orders of the defined types were placed in this encounter.   Impression: Ovarian tumor of borderline malignancy, right    Plan: CT scan in 6 months per our 5 year surveillance plan  Follow Up: Return if symptoms worsen or fail to  improve.       Face to face time:  15 minutes  Greater than 50% of the visit time was spent in counseling and coordination of care with the patient.  The summary and outline of the counseling and care coordination is summarized in the note above.   All questions were answered.  Past Medical History:  Diagnosis Date  . GERD (gastroesophageal reflux disease)   . History of chickenpox   . Migraine   . Pregnant 08/20/2014    Past Surgical History:  Procedure Laterality Date  . cysterectomy    . LAPAROSCOPIC OVARIAN CYSTECTOMY Right 03/03/2016   Procedure: LAPAROSCOPIC RIGHT OVARIAN CYSTECTOMY AND RIGHT OOPHORECTOMY;  Surgeon: Florian Buff, MD;  Location: [facility];  Service: Gynecology;   Laterality: Right;  . OOPHORECTOMY Right   . WISDOM TOOTH EXTRACTION      OB History    Gravida  2   Para  1   Term  1   Preterm      AB      Living  1     SAB      TAB      Ectopic      Multiple  0   Live Births  1           No Known Allergies  Social History   Socioeconomic History  . Marital status: Married    Spouse name: Not on file  . Number of children: 1  . Years of education: Not on file  . Highest education level: Not on file  Occupational History  . Not on file  Social Needs  . Financial resource strain: Not on file  . Food insecurity:    Worry: Not on file    Inability: Not on file  . Transportation needs:    Medical: Not on file    Non-medical: Not on file  Tobacco Use  . Smoking status: Never Smoker  . Smokeless tobacco: Never Used  Substance and Sexual Activity  . Alcohol use: Not Currently    Alcohol/week: 0.0 standard drinks    Comment: occassionally   . Drug use: No  . Sexual activity: Yes    Partners: Male    Birth control/protection: Pill  Lifestyle  . Physical activity:    Days per week: Not on file    Minutes per session: Not on file  . Stress: Not on file  Relationships  . Social connections:    Talks on phone: Not on file    Gets together: Not on file    Attends religious service: Not on file    Active member of club or organization: Not on file    Attends meetings of clubs or organizations: Not on file    Relationship status: Not on file  Other Topics Concern  . Not on file  Social History Narrative  . Not on file    Family History  Problem Relation Age of Onset  . Hypertension Mother   . Hyperlipidemia Mother   . Anxiety disorder Mother   . Healthy Father   . Cancer Maternal Grandmother        skin ; also intestinal malignancy in 74s  . Diabetes Maternal Grandmother   . Dementia Maternal Grandmother   . Stroke Maternal Grandfather   . Aneurysm Maternal Grandfather        brain  . Cancer Paternal  Grandmother        bone and breast  . Mental illness Maternal Uncle   .  Stroke Maternal Uncle

## 2018-11-27 NOTE — Progress Notes (Addendum)
PELVIC US TA/TV: homogeneous anteverted uterus,wnl,EEC 3.3 mm,right oophorectomy,right adnexa wnl,left ovarian cyst w/a septation vs two small cysts 2.8 x 2.1 x 1.8 cm,ovary appears mobile,no free fluid,no pain during ultrasound   Chaperone: Tish

## 2018-12-21 ENCOUNTER — Encounter: Payer: Self-pay | Admitting: Physician Assistant

## 2018-12-21 ENCOUNTER — Ambulatory Visit (INDEPENDENT_AMBULATORY_CARE_PROVIDER_SITE_OTHER): Payer: Managed Care, Other (non HMO) | Admitting: Physician Assistant

## 2018-12-21 ENCOUNTER — Encounter: Payer: Managed Care, Other (non HMO) | Admitting: Physician Assistant

## 2018-12-21 ENCOUNTER — Other Ambulatory Visit: Payer: Self-pay

## 2018-12-21 VITALS — BP 120/82 | HR 81 | Temp 98.0°F | Resp 16 | Ht 64.0 in | Wt 219.0 lb

## 2018-12-21 DIAGNOSIS — Z6837 Body mass index (BMI) 37.0-37.9, adult: Secondary | ICD-10-CM | POA: Diagnosis not present

## 2018-12-21 DIAGNOSIS — E6609 Other obesity due to excess calories: Secondary | ICD-10-CM

## 2018-12-21 DIAGNOSIS — B351 Tinea unguium: Secondary | ICD-10-CM | POA: Diagnosis not present

## 2018-12-21 DIAGNOSIS — Z0000 Encounter for general adult medical examination without abnormal findings: Secondary | ICD-10-CM | POA: Diagnosis not present

## 2018-12-21 LAB — CBC WITH DIFFERENTIAL/PLATELET
Basophils Absolute: 0 10*3/uL (ref 0.0–0.1)
Basophils Relative: 0.4 % (ref 0.0–3.0)
Eosinophils Absolute: 0.1 10*3/uL (ref 0.0–0.7)
Eosinophils Relative: 1.1 % (ref 0.0–5.0)
HCT: 39.7 % (ref 36.0–46.0)
Hemoglobin: 13.1 g/dL (ref 12.0–15.0)
Lymphocytes Relative: 29.9 % (ref 12.0–46.0)
Lymphs Abs: 2.1 10*3/uL (ref 0.7–4.0)
MCHC: 33.1 g/dL (ref 30.0–36.0)
MCV: 88.4 fl (ref 78.0–100.0)
Monocytes Absolute: 0.4 10*3/uL (ref 0.1–1.0)
Monocytes Relative: 6.3 % (ref 3.0–12.0)
Neutro Abs: 4.4 10*3/uL (ref 1.4–7.7)
Neutrophils Relative %: 62.3 % (ref 43.0–77.0)
Platelets: 307 10*3/uL (ref 150.0–400.0)
RBC: 4.49 Mil/uL (ref 3.87–5.11)
RDW: 12.7 % (ref 11.5–15.5)
WBC: 7.1 10*3/uL (ref 4.0–10.5)

## 2018-12-21 LAB — LIPID PANEL
Cholesterol: 190 mg/dL (ref 0–200)
HDL: 41.6 mg/dL (ref >39.00)
LDL Cholesterol: 120 mg/dL — ABNORMAL HIGH (ref 0–99)
NonHDL: 148.78
Total CHOL/HDL Ratio: 5
Triglycerides: 143 mg/dL (ref 0.0–149.0)
VLDL: 28.6 mg/dL (ref 0.0–40.0)

## 2018-12-21 LAB — COMPREHENSIVE METABOLIC PANEL
ALT: 10 U/L (ref 0–35)
AST: 11 U/L (ref 0–37)
Albumin: 4.1 g/dL (ref 3.5–5.2)
Alkaline Phosphatase: 57 U/L (ref 39–117)
BUN: 10 mg/dL (ref 6–23)
CO2: 24 mEq/L (ref 19–32)
Calcium: 8.7 mg/dL (ref 8.4–10.5)
Chloride: 106 mEq/L (ref 96–112)
Creatinine, Ser: 0.64 mg/dL (ref 0.40–1.20)
GFR: 106.43 mL/min (ref >60.00)
Glucose, Bld: 89 mg/dL (ref 70–99)
Potassium: 4.2 mEq/L (ref 3.5–5.1)
Sodium: 138 mEq/L (ref 135–145)
Total Bilirubin: 0.6 mg/dL (ref 0.2–1.2)
Total Protein: 6.4 g/dL (ref 6.0–8.3)

## 2018-12-21 LAB — HEMOGLOBIN A1C: Hgb A1c MFr Bld: 5.6 % (ref 4.6–6.5)

## 2018-12-21 LAB — VITAMIN D 25 HYDROXY (VIT D DEFICIENCY, FRACTURES): VITD: 12.05 ng/mL — ABNORMAL LOW (ref 30.00–100.00)

## 2018-12-21 LAB — TSH: TSH: 0.7 u[IU]/mL (ref 0.35–4.50)

## 2018-12-21 NOTE — Progress Notes (Signed)
Patient presents to clinic today for annual exam.  Patient is fasting for labs.  Diet -- Notes needing to make better food choices. Not bad portion sizes per patient. Not a big snacker. Mostly meals at home -- but carb loaded. Needs more vegetables and fruits but feels this is harder as her kids are very picky eaters.   Acute Concerns: Endorses nail changes in toenails x 2 year. Notes brittle nails, thickened nails and yellowing coloration. Denies pain of nails, nail bed or surrounding skin.    Chronic Issues: GERD -- Patient is taking Omeprazole OTC daily. Notes keeping symptoms under really good control. Denies abdominal pain, nausea or vomiting.   Health Maintenance: Immunizations -- UTD PAP -- UTD. Followed by GYN Dr. Elonda Husky with [facility].  Past Medical History:  Diagnosis Date  . GERD (gastroesophageal reflux disease)   . History of chickenpox   . Migraine   . Pregnant 08/20/2014    Past Surgical History:  Procedure Laterality Date  . cysterectomy    . LAPAROSCOPIC OVARIAN CYSTECTOMY Right 03/03/2016   Procedure: LAPAROSCOPIC RIGHT OVARIAN CYSTECTOMY AND RIGHT OOPHORECTOMY;  Surgeon: Florian Buff, MD;  Location: [facility];  Service: Gynecology;  Laterality: Right;  . OOPHORECTOMY Right   . WISDOM TOOTH EXTRACTION      Current Outpatient Medications on File Prior to Visit  Medication Sig Dispense Refill  . LO LOESTRIN FE 1 MG-10 MCG / 10 MCG tablet Take 1 tablet by mouth daily. 3 Package 3   No current facility-administered medications on file prior to visit.     No Known Allergies  Family History  Problem Relation Age of Onset  . Hypertension Mother   . Hyperlipidemia Mother   . Anxiety disorder Mother   . Healthy Father   . Cancer Maternal Grandmother        skin ; also intestinal malignancy in 79s  . Diabetes Maternal Grandmother   . Dementia Maternal Grandmother   . Stroke Maternal Grandfather   . Aneurysm Maternal Grandfather        brain  .  Cancer Paternal Grandmother        bone and breast  . Mental illness Maternal Uncle   . Stroke Maternal Uncle     Social History   Socioeconomic History  . Marital status: Married    Spouse name: Not on file  . Number of children: 1  . Years of education: Not on file  . Highest education level: Not on file  Occupational History  . Not on file  Social Needs  . Financial resource strain: Not on file  . Food insecurity    Worry: Not on file    Inability: Not on file  . Transportation needs    Medical: Not on file    Non-medical: Not on file  Tobacco Use  . Smoking status: Never Smoker  . Smokeless tobacco: Never Used  Substance and Sexual Activity  . Alcohol use: Not Currently    Alcohol/week: 0.0 standard drinks    Comment: occassionally   . Drug use: No  . Sexual activity: Yes    Partners: Male    Birth control/protection: Pill  Lifestyle  . Physical activity    Days per week: Not on file    Minutes per session: Not on file  . Stress: Not on file  Relationships  . Social Herbalist on phone: Not on file    Gets together: Not on file  Attends religious service: Not on file    Active member of club or organization: Not on file    Attends meetings of clubs or organizations: Not on file    Relationship status: Not on file  . Intimate partner violence    Fear of current or ex partner: Not on file    Emotionally abused: Not on file    Physically abused: Not on file    Forced sexual activity: Not on file  Other Topics Concern  . Not on file  Social History Narrative  . Not on file    Review of Systems  Constitutional: Negative for fever and weight loss.  HENT: Negative for ear discharge, ear pain, hearing loss and tinnitus.   Eyes: Negative for blurred vision, double vision, photophobia and pain.  Respiratory: Negative for cough and shortness of breath.   Cardiovascular: Negative for chest pain and palpitations.  Gastrointestinal: Negative for  abdominal pain, blood in stool, constipation, diarrhea, heartburn, melena, nausea and vomiting.  Genitourinary: Negative for dysuria, flank pain, frequency, hematuria and urgency.  Musculoskeletal: Negative for falls.  Skin:       + nail changes  Neurological: Negative for dizziness, loss of consciousness and headaches.  Endo/Heme/Allergies: Negative for environmental allergies.  Psychiatric/Behavioral: Negative for depression, hallucinations, substance abuse and suicidal ideas. The patient is not nervous/anxious and does not have insomnia.    BP 120/82   Pulse 81   Temp 98 F (36.7 C) (Skin)   Resp 16   Ht 5\' 4"  (1.626 m)   Wt 219 lb (99.3 kg)   SpO2 98%   Breastfeeding No   BMI 37.59 kg/m   Physical Exam Vitals signs reviewed.  HENT:     Head: Normocephalic and atraumatic.     Right Ear: Tympanic membrane, ear canal and external ear normal.     Left Ear: Tympanic membrane, ear canal and external ear normal.     Nose: Nose normal. No mucosal edema.     Mouth/Throat:     Pharynx: Uvula midline. No oropharyngeal exudate or posterior oropharyngeal erythema.  Eyes:     Conjunctiva/sclera: Conjunctivae normal.     Pupils: Pupils are equal, round, and reactive to light.  Neck:     Musculoskeletal: Neck supple.     Thyroid: No thyromegaly.  Cardiovascular:     Rate and Rhythm: Normal rate and regular rhythm.     Pulses:          Dorsalis pedis pulses are 2+ on the right side and 2+ on the left side.       Posterior tibial pulses are 2+ on the right side and 2+ on the left side.     Heart sounds: Normal heart sounds.  Pulmonary:     Effort: Pulmonary effort is normal. No respiratory distress.     Breath sounds: Normal breath sounds. No wheezing or rales.  Abdominal:     General: Bowel sounds are normal. There is no distension.     Palpations: Abdomen is soft. There is no mass.     Tenderness: There is no abdominal tenderness. There is no guarding or rebound.  Feet:      Right foot:     Skin integrity: Skin integrity normal.     Toenail Condition: Right toenails are abnormally thick. Fungal disease present.    Left foot:     Skin integrity: Skin integrity normal.     Toenail Condition: Left toenails are abnormally thick. Fungal disease present. Lymphadenopathy:  Cervical: No cervical adenopathy.  Skin:    General: Skin is warm and dry.     Findings: No rash.  Neurological:     Mental Status: She is alert and oriented to person, place, and time.     Cranial Nerves: No cranial nerve deficit.    Assessment/Plan: Onychomycosis With some signs of potential subungual psoriasis. Referral to Podiatry for further assessment and management giving potential multiple issues contributing to nail health.  Visit for preventive health examination Depression screen negative. Health Maintenance reviewed. Preventive schedule discussed and handout given in AVS. Will obtain fasting labs today.   Class 2 obesity due to excess calories without serious comorbidity with body mass index (BMI) of 37.0 to 37.9 in adult Body mass index is 37.59 kg/m. Dietary and exercise recommendations reviewed. Handout given. Is going to try to work in more vegetables. Discussed proper plate portions and need to work on a more plant-based diet or Mediterranean diet. She is going to look into this. Will monitor closely.     Leeanne Rio, PA-C

## 2018-12-21 NOTE — Patient Instructions (Addendum)
Please go to the lab for blood work.   Our office will call you with your results unless you have chosen to receive results via MyChart.  If your blood work is normal we will follow-up each year for physicals and as scheduled for chronic medical problems.  If anything is abnormal we will treat accordingly and get you in for a follow-up.  You will be contacted for assessment with Podiatry for the nails.  Let me know if you do not hear from them within a week!  Please work on trying to increase intake of fruits and vegetables. Try to avoid red meat or limit to once to twice weekly if consuming. Increase fluids.  Our goal is to work up to 150 minutes of aerobic activity per week.   Preventive Care 34-34 Years Old, Female Preventive care refers to visits with your health care provider and lifestyle choices that can promote health and wellness. This includes:  A yearly physical exam. This may also be called an annual well check.  Regular dental visits and eye exams.  Immunizations.  Screening for certain conditions.  Healthy lifestyle choices, such as eating a healthy diet, getting regular exercise, not using drugs or products that contain nicotine and tobacco, and limiting alcohol use. What can I expect for my preventive care visit? Physical exam Your health care provider will check your:  Height and weight. This may be used to calculate body mass index (BMI), which tells if you are at a healthy weight.  Heart rate and blood pressure.  Skin for abnormal spots. Counseling Your health care provider may ask you questions about your:  Alcohol, tobacco, and drug use.  Emotional well-being.  Home and relationship well-being.  Sexual activity.  Eating habits.  Work and work Statistician.  Method of birth control.  Menstrual cycle.  Pregnancy history. What immunizations do I need?  Influenza (flu) vaccine  This is recommended every year. Tetanus, diphtheria, and  pertussis (Tdap) vaccine  You may need a Td booster every 10 years. Varicella (chickenpox) vaccine  You may need this if you have not been vaccinated. Human papillomavirus (HPV) vaccine  If recommended by your health care provider, you may need three doses over 6 months. Measles, mumps, and rubella (MMR) vaccine  You may need at least one dose of MMR. You may also need a second dose. Meningococcal conjugate (MenACWY) vaccine  One dose is recommended if you are age 39-21 years and a first-year college student living in a residence hall, or if you have one of several medical conditions. You may also need additional booster doses. Pneumococcal conjugate (PCV13) vaccine  You may need this if you have certain conditions and were not previously vaccinated. Pneumococcal polysaccharide (PPSV23) vaccine  You may need one or two doses if you smoke cigarettes or if you have certain conditions. Hepatitis A vaccine  You may need this if you have certain conditions or if you travel or work in places where you may be exposed to hepatitis A. Hepatitis B vaccine  You may need this if you have certain conditions or if you travel or work in places where you may be exposed to hepatitis B. Haemophilus influenzae type b (Hib) vaccine  You may need this if you have certain conditions. You may receive vaccines as individual doses or as more than one vaccine together in one shot (combination vaccines). Talk with your health care provider about the risks and benefits of combination vaccines. What tests do I need?  Blood tests  Lipid and cholesterol levels. These may be checked every 5 years starting at age 61.  Hepatitis C test.  Hepatitis B test. Screening  Diabetes screening. This is done by checking your blood sugar (glucose) after you have not eaten for a while (fasting).  Sexually transmitted disease (STD) testing.  BRCA-related cancer screening. This may be done if you have a family history  of breast, ovarian, tubal, or peritoneal cancers.  Pelvic exam and Pap test. This may be done every 3 years starting at age 60. Starting at age 41, this may be done every 5 years if you have a Pap test in combination with an HPV test. Talk with your health care provider about your test results, treatment options, and if necessary, the need for more tests. Follow these instructions at home: Eating and drinking   Eat a diet that includes fresh fruits and vegetables, whole grains, lean protein, and low-fat dairy.  Take vitamin and mineral supplements as recommended by your health care provider.  Do not drink alcohol if: ? Your health care provider tells you not to drink. ? You are pregnant, may be pregnant, or are planning to become pregnant.  If you drink alcohol: ? Limit how much you have to 0-1 drink a day. ? Be aware of how much alcohol is in your drink. In the [place] one drink equals one 12 oz bottle of beer (355 mL), one 5 oz glass of wine (148 mL), or one 1 oz glass of hard liquor (44 mL). Lifestyle  Take daily care of your teeth and gums.  Stay active. Exercise for at least 30 minutes on 5 or more days each week.  Do not use any products that contain nicotine or tobacco, such as cigarettes, e-cigarettes, and chewing tobacco. If you need help quitting, ask your health care provider.  If you are sexually active, practice safe sex. Use a condom or other form of birth control (contraception) in order to prevent pregnancy and STIs (sexually transmitted infections). If you plan to become pregnant, see your health care provider for a preconception visit. What's next?  Visit your health care provider once a year for a well check visit.  Ask your health care provider how often you should have your eyes and teeth checked.  Stay up to date on all vaccines. This information is not intended to replace advice given to you by your health care provider. Make sure you discuss any questions you  have with your health care provider. Document Released: 08/03/2001 Document Revised: 02/16/2018 Document Reviewed: 02/16/2018 Elsevier Patient Education  2020 [employer]. .

## 2018-12-22 ENCOUNTER — Encounter: Payer: Self-pay | Admitting: Physician Assistant

## 2018-12-22 DIAGNOSIS — E6609 Other obesity due to excess calories: Secondary | ICD-10-CM | POA: Insufficient documentation

## 2018-12-22 DIAGNOSIS — E559 Vitamin D deficiency, unspecified: Secondary | ICD-10-CM

## 2018-12-22 DIAGNOSIS — Z0000 Encounter for general adult medical examination without abnormal findings: Secondary | ICD-10-CM | POA: Insufficient documentation

## 2018-12-22 DIAGNOSIS — B351 Tinea unguium: Secondary | ICD-10-CM | POA: Insufficient documentation

## 2018-12-22 NOTE — Assessment & Plan Note (Signed)
Depression screen negative. Health Maintenance reviewed. Preventive schedule discussed and handout given in AVS. Will obtain fasting labs today.  

## 2018-12-22 NOTE — Assessment & Plan Note (Signed)
Body mass index is 37.59 kg/m. Dietary and exercise recommendations reviewed. Handout given. Is going to try to work in more vegetables. Discussed proper plate portions and need to work on a more plant-based diet or Mediterranean diet. She is going to look into this. Will monitor closely.

## 2018-12-22 NOTE — Assessment & Plan Note (Signed)
With some signs of potential subungual psoriasis. Referral to Podiatry for further assessment and management giving potential multiple issues contributing to nail health.

## 2018-12-26 MED ORDER — VITAMIN D (ERGOCALCIFEROL) 1.25 MG (50000 UNIT) PO CAPS: 50000.0000 [IU] | ORAL_CAPSULE | ORAL | 0 refills | Status: DC

## 2019-01-02 ENCOUNTER — Other Ambulatory Visit: Payer: Self-pay

## 2019-01-02 ENCOUNTER — Ambulatory Visit: Payer: Managed Care, Other (non HMO) | Admitting: Sports Medicine

## 2019-01-02 ENCOUNTER — Encounter: Payer: Self-pay | Admitting: Sports Medicine

## 2019-01-02 VITALS — BP 133/86 | HR 76 | Temp 98.0°F | Resp 16

## 2019-01-02 DIAGNOSIS — B351 Tinea unguium: Secondary | ICD-10-CM | POA: Diagnosis not present

## 2019-01-02 DIAGNOSIS — M79675 Pain in left toe(s): Secondary | ICD-10-CM | POA: Diagnosis not present

## 2019-01-02 DIAGNOSIS — M79674 Pain in right toe(s): Secondary | ICD-10-CM

## 2019-01-02 NOTE — Progress Notes (Signed)
Subjective: Teresa Bright is a 33 y.o. female patient seen today in office with complaint of mildly painful thickened and discolored nails. Patient is desiring treatment for nail changes; has tried OTC topicals/Medication in the past with no improvement. Reports that nails are becoming difficult to manage because of the thickness. Patient has no other pedal complaints at this time.   Review of Systems  All other systems reviewed and are negative.    Patient Active Problem List   Diagnosis Date Noted  . Visit for preventive health examination 12/22/2018  . Onychomycosis 12/22/2018  . Class 2 obesity due to excess calories without serious comorbidity with body mass index (BMI) of 37.0 to 37.9 in adult 12/22/2018  . Cervical high risk HPV (human papillomavirus) test positive 10/15/2016  . 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 Ovarian cyst, right 10/03/2014    Current Outpatient Medications on File Prior to Visit  Medication Sig Dispense Refill  . Cholecalciferol (VITAMIN D3 PO) Take 1 tablet by mouth daily.    . LO LOESTRIN FE 1 MG-10 MCG / 10 MCG tablet Take 1 tablet by mouth daily. 3 Package 3  . Vitamin D, Ergocalciferol, (DRISDOL) 1.25 MG (50000 UT) CAPS capsule Take 1 capsule (50,000 Units total) by mouth every 7 (seven) days. 12 capsule 0   No current facility-administered medications on file prior to visit.     No Known Allergies  Objective: Physical Exam  General: Well developed, nourished, no acute distress, awake, alert and oriented x 3  Vascular: Dorsalis pedis artery 2/4 bilateral, Posterior tibial artery 2/4 bilateral, skin temperature warm to warm proximal to distal bilateral lower extremities, no varicosities, pedal hair present bilateral.  Neurological: Gross sensation present via light touch bilateral.   Dermatological: Skin is warm, dry, and supple bilateral, Nails 1-10 are tender, short thick, and discolored with mild subungal debris, no webspace  macerations present bilateral, no open lesions present bilateral, no callus/corns/hyperkeratotic tissue present bilateral. No signs of infection bilateral.  Musculoskeletal: No symptomatic boney deformities noted bilateral. Muscular strength within normal limits without painon range of motion. No pain with calf compression bilateral.  Assessment and Plan:  Problem List Items Addressed This Visit    None    Visit Diagnoses    Pain due to onychomycosis of toenails of both feet    -  Primary   Nail fungus       Relevant Orders   Culture, fungus without smear     -Examined patient -Discussed treatment options for painful dystrophic nails  -Fungal culture was obtained by removing a portion of the hard nail itself from each of the involved toenails using a sterile nail nipper and sent to [facility] lab. Patient tolerated the biopsy procedure well without discomfort or need for anesthesia.  -Patient to return in 4 weeks for follow up evaluation and discussion of fungal culture results or sooner if symptoms worsen.  Landis Martins, DPM

## 2019-01-05 ENCOUNTER — Encounter: Payer: Self-pay | Admitting: Physician Assistant

## 2019-01-05 ENCOUNTER — Other Ambulatory Visit: Payer: Self-pay | Admitting: Physician Assistant

## 2019-01-05 MED ORDER — NYSTATIN 100000 UNIT/GM EX CREA: 1.0000 "application " | TOPICAL_CREAM | Freq: Two times a day (BID) | CUTANEOUS | 0 refills | Status: DC

## 2019-01-30 ENCOUNTER — Ambulatory Visit: Payer: Managed Care, Other (non HMO) | Admitting: Sports Medicine

## 2019-01-30 ENCOUNTER — Encounter: Payer: Self-pay | Admitting: Sports Medicine

## 2019-01-30 ENCOUNTER — Other Ambulatory Visit: Payer: Self-pay

## 2019-01-30 VITALS — Temp 98.0°F

## 2019-01-30 DIAGNOSIS — B351 Tinea unguium: Secondary | ICD-10-CM

## 2019-01-30 DIAGNOSIS — M79675 Pain in left toe(s): Secondary | ICD-10-CM | POA: Diagnosis not present

## 2019-01-30 DIAGNOSIS — M79674 Pain in right toe(s): Secondary | ICD-10-CM | POA: Diagnosis not present

## 2019-01-30 MED ORDER — TERBINAFINE HCL 250 MG PO TABS: 250.0000 mg | ORAL_TABLET | Freq: Every day | ORAL | 0 refills | Status: DC

## 2019-01-30 NOTE — Progress Notes (Signed)
Subjective: Ladena Jacquez is a 33 y.o. female patient seen today in office for fungal culture results. Patient has no other pedal complaints at this time.   Patient Active Problem List   Diagnosis Date Noted  . Visit for preventive health examination 12/22/2018  . Onychomycosis 12/22/2018  . Class 2 obesity due to excess calories without serious comorbidity with body mass index (BMI) of 37.0 to 37.9 in adult 12/22/2018  . Cervical high risk HPV (human papillomavirus) test positive 10/15/2016  . 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 Ovarian cyst, right 10/03/2014    Current Outpatient Medications on File Prior to Visit  Medication Sig Dispense Refill  . Cholecalciferol (VITAMIN D3 PO) Take 1 tablet by mouth daily.    . LO LOESTRIN FE 1 MG-10 MCG / 10 MCG tablet Take 1 tablet by mouth daily. 3 Package 3  . nystatin cream (MYCOSTATIN) Apply 1 application topically 2 (two) times daily. 30 g 0  . Vitamin D, Ergocalciferol, (DRISDOL) 1.25 MG (50000 UT) CAPS capsule Take 1 capsule (50,000 Units total) by mouth every 7 (seven) days. 12 capsule 0   No current facility-administered medications on file prior to visit.     No Known Allergies  Objective: Physical Exam  General: Well developed, nourished, no acute distress, awake, alert and oriented x 3  Vascular: Dorsalis pedis artery 2/4 bilateral, Posterior tibial artery 2/4 bilateral, skin temperature warm to warm proximal to distal bilateral lower extremities, no varicosities, pedal hair present bilateral.  Neurological: Gross sensation present via light touch bilateral.   Dermatological: Skin is warm, dry, and supple bilateral, Nails 1-10 are tender, short thick, and discolored with mild subungal debris, no webspace macerations present bilateral, no open lesions present bilateral, no callus/corns/hyperkeratotic tissue present bilateral. No signs of infection bilateral.  Musculoskeletal: No symptomatic boney deformities  noted bilateral. Muscular strength within normal limits without painon range of motion. No pain with calf compression bilateral.  Fungal culture just for fungus T rubrum  Assessment and Plan:  Problem List Items Addressed This Visit    None    Visit Diagnoses    Nail fungus    -  Primary   Toe pain, bilateral          -Examined patient -Discussed treatment options for painful mycotic nails -Patient opt for oral Lamisil with full understanding of medication risks; LFTs for review 12/21/2018 and are normal will proceed with sending Rx to pharmacy for lamisil 250mg  PO daily. Anticipate 12 week course.  -Continue with topical antifungal cream -Advised good hygiene habits -Patient to return in 6 weeks for follow up evaluation or sooner if symptoms worsen.  Landis Martins, DPM

## 2019-02-16 ENCOUNTER — Encounter: Payer: Self-pay | Admitting: Physician Assistant

## 2019-03-06 ENCOUNTER — Encounter: Payer: Self-pay | Admitting: Sports Medicine

## 2019-03-06 ENCOUNTER — Ambulatory Visit: Payer: Managed Care, Other (non HMO) | Admitting: Sports Medicine

## 2019-03-06 ENCOUNTER — Other Ambulatory Visit: Payer: Self-pay

## 2019-03-06 DIAGNOSIS — B351 Tinea unguium: Secondary | ICD-10-CM

## 2019-03-06 DIAGNOSIS — M79674 Pain in right toe(s): Secondary | ICD-10-CM

## 2019-03-06 DIAGNOSIS — M79675 Pain in left toe(s): Secondary | ICD-10-CM

## 2019-03-06 NOTE — Progress Notes (Signed)
Subjective: Teresa Bright is a 33 y.o. female patient seen today in office for follow up evaluation of nail fungus on Lamisil. Patient states that she is doing well with no adverse reaction. Patient has no other pedal complaints at this time.   Patient Active Problem List   Diagnosis Date Noted  . Visit for preventive health examination 12/22/2018  . Onychomycosis 12/22/2018  . Class 2 obesity due to excess calories without serious comorbidity with body mass index (BMI) of 37.0 to 37.9 in adult 12/22/2018  . Cervical high risk HPV (human papillomavirus) test positive 10/15/2016  . 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 Ovarian cyst, right 10/03/2014    Current Outpatient Medications on File Prior to Visit  Medication Sig Dispense Refill  . Cholecalciferol (VITAMIN D3 PO) Take 1 tablet by mouth daily.    . LO LOESTRIN FE 1 MG-10 MCG / 10 MCG tablet Take 1 tablet by mouth daily. 3 Package 3  . nystatin cream (MYCOSTATIN) Apply 1 application topically 2 (two) times daily. 30 g 0  . terbinafine (LAMISIL) 250 MG tablet Take 1 tablet (250 mg total) by mouth daily. 90 tablet 0  . Vitamin D, Ergocalciferol, (DRISDOL) 1.25 MG (50000 UT) CAPS capsule Take 1 capsule (50,000 Units total) by mouth every 7 (seven) days. 12 capsule 0   No current facility-administered medications on file prior to visit.     No Known Allergies  Objective: Physical Exam  General: Well developed, nourished, no acute distress, awake, alert and oriented x 3  Vascular: Dorsalis pedis artery 2/4 bilateral, Posterior tibial artery 2/4 bilateral, skin temperature warm to warm proximal to distal bilateral lower extremities, no varicosities, pedal hair present bilateral.  Neurological: Gross sensation present via light touch bilateral.   Dermatological: Skin is warm, dry, and supple bilateral, Nails 1-10 are tender, short thick, and discolored with mild subungal debris and minimal clearance noted at proximal  nail bed, no webspace macerations present bilateral, no open lesions present bilateral, no callus/corns/hyperkeratotic tissue present bilateral. No signs of infection bilateral.  Musculoskeletal: No symptomatic boney deformities noted bilateral. Muscular strength within normal limits without painon range of motion. No pain with calf compression bilateral.  Assessment and Plan:  Problem List Items Addressed This Visit    None    Visit Diagnoses    Nail fungus    -  Primary   Toe pain, bilateral         -Examined patient -Cont with Lamisil; a new set of LFTs were ordered; will call patient to stop medication if abnormal  -Advised good hygiene habits and educated patient on proper foot care to prevent re-infection -Patient to return in 6 weeks for Final med check and follow up evaluation or sooner if symptoms worsen.  Landis Martins, DPM

## 2019-03-13 ENCOUNTER — Other Ambulatory Visit: Payer: Self-pay

## 2019-03-13 ENCOUNTER — Ambulatory Visit (INDEPENDENT_AMBULATORY_CARE_PROVIDER_SITE_OTHER): Payer: Managed Care, Other (non HMO)

## 2019-03-13 DIAGNOSIS — Z23 Encounter for immunization: Secondary | ICD-10-CM

## 2019-03-13 DIAGNOSIS — B351 Tinea unguium: Secondary | ICD-10-CM | POA: Diagnosis not present

## 2019-03-14 LAB — HEPATIC FUNCTION PANEL
AG Ratio: 1.9 (calc) (ref 1.0–2.5)
ALT: 13 U/L (ref 6–29)
AST: 12 U/L (ref 10–30)
Albumin: 4.3 g/dL (ref 3.6–5.1)
Alkaline phosphatase (APISO): 52 U/L (ref 31–125)
Bilirubin, Direct: 0.1 mg/dL (ref 0.0–0.2)
Globulin: 2.3 g/dL (calc) (ref 1.9–3.7)
Indirect Bilirubin: 0.4 mg/dL (calc) (ref 0.2–1.2)
Total Bilirubin: 0.5 mg/dL (ref 0.2–1.2)
Total Protein: 6.6 g/dL (ref 6.1–8.1)

## 2019-03-14 LAB — EXTRA LAV TOP TUBE

## 2019-04-17 ENCOUNTER — Ambulatory Visit: Payer: Managed Care, Other (non HMO) | Admitting: Sports Medicine

## 2019-04-17 ENCOUNTER — Other Ambulatory Visit: Payer: Self-pay

## 2019-04-17 ENCOUNTER — Encounter: Payer: Self-pay | Admitting: Sports Medicine

## 2019-04-17 DIAGNOSIS — M79674 Pain in right toe(s): Secondary | ICD-10-CM | POA: Diagnosis not present

## 2019-04-17 DIAGNOSIS — M79675 Pain in left toe(s): Secondary | ICD-10-CM

## 2019-04-17 DIAGNOSIS — B351 Tinea unguium: Secondary | ICD-10-CM

## 2019-04-17 NOTE — Progress Notes (Signed)
Subjective: Teresa Bright is a 33 y.o. female patient seen today in office for follow up evaluation of nail fungus on Lamisil since 01-30-19. Patient states that she is doing well with no adverse reaction. Patient has no other pedal complaints at this time.   Patient Active Problem List   Diagnosis Date Noted  . Visit for preventive health examination 12/22/2018  . Onychomycosis 12/22/2018  . Class 2 obesity due to excess calories without serious comorbidity with body mass index (BMI) of 37.0 to 37.9 in adult 12/22/2018  . Cervical high risk HPV (human papillomavirus) test positive 10/15/2016  . 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 Ovarian cyst, right 10/03/2014    Current Outpatient Medications on File Prior to Visit  Medication Sig Dispense Refill  . Cholecalciferol (VITAMIN D3 PO) Take 1 tablet by mouth daily.    . LO LOESTRIN FE 1 MG-10 MCG / 10 MCG tablet Take 1 tablet by mouth daily. 3 Package 3  . nystatin cream (MYCOSTATIN) Apply 1 application topically 2 (two) times daily. 30 g 0  . terbinafine (LAMISIL) 250 MG tablet Take 1 tablet (250 mg total) by mouth daily. 90 tablet 0  . Vitamin D, Ergocalciferol, (DRISDOL) 1.25 MG (50000 UT) CAPS capsule Take 1 capsule (50,000 Units total) by mouth every 7 (seven) days. 12 capsule 0   No current facility-administered medications on file prior to visit.     No Known Allergies  Objective: Physical Exam  General: Well developed, nourished, no acute distress, awake, alert and oriented x 3  Vascular: Dorsalis pedis artery 2/4 bilateral, Posterior tibial artery 2/4 bilateral, skin temperature warm to warm proximal to distal bilateral lower extremities, no varicosities, pedal hair present bilateral.  Neurological: Gross sensation present via light touch bilateral.   Dermatological: Skin is warm, dry, and supple bilateral, Nails 1-10 are tender, short thick, and discolored with mild subungal debris and minimal clearance  noted at proximal nail bed, no webspace macerations present bilateral, no open lesions present bilateral, no callus/corns/hyperkeratotic tissue present bilateral. No signs of infection bilateral.  Musculoskeletal: No symptomatic boney deformities noted bilateral. Muscular strength within normal limits without painon range of motion. No pain with calf compression bilateral.  Assessment and Plan:  Problem List Items Addressed This Visit    None    Visit Diagnoses    Nail fungus    -  Primary   Toe pain, bilateral         -Examined patient -Previous LFTs reviewed  -Cont with Lamisil until completed -Recommend good hygiene habits daily to prevent reinfection -Advised patient to closely monitor as her nails continue to grow out especially over the next 6 months after 6 months if there is recurrence of problems to return to office for reevaluation.  Landis Martins, DPM

## 2019-06-22 HISTORY — PX: BREAST BIOPSY: SHX20

## 2019-07-04 ENCOUNTER — Ambulatory Visit: Payer: Managed Care, Other (non HMO) | Admitting: Adult Health

## 2019-07-04 ENCOUNTER — Encounter: Payer: Self-pay | Admitting: Adult Health

## 2019-07-04 ENCOUNTER — Other Ambulatory Visit: Payer: Self-pay

## 2019-07-04 VITALS — BP 131/94 | HR 64 | Ht 64.0 in | Wt 210.0 lb

## 2019-07-04 DIAGNOSIS — N760 Acute vaginitis: Secondary | ICD-10-CM | POA: Diagnosis not present

## 2019-07-04 DIAGNOSIS — R300 Dysuria: Secondary | ICD-10-CM | POA: Diagnosis not present

## 2019-07-04 DIAGNOSIS — B9689 Other specified bacterial agents as the cause of diseases classified elsewhere: Secondary | ICD-10-CM | POA: Diagnosis not present

## 2019-07-04 DIAGNOSIS — N898 Other specified noninflammatory disorders of vagina: Secondary | ICD-10-CM | POA: Insufficient documentation

## 2019-07-04 LAB — POCT WET PREP (WET MOUNT)
Clue Cells Wet Prep Whiff POC: NEGATIVE
WBC, Wet Prep HPF POC: POSITIVE

## 2019-07-04 LAB — POCT URINALYSIS DIPSTICK
Blood, UA: NEGATIVE
Glucose, UA: NEGATIVE
Leukocytes, UA: NEGATIVE
Nitrite, UA: NEGATIVE
Protein, UA: NEGATIVE

## 2019-07-04 MED ORDER — METRONIDAZOLE 500 MG PO TABS: 500.0000 mg | ORAL_TABLET | Freq: Two times a day (BID) | ORAL | 0 refills | Status: DC

## 2019-07-04 NOTE — Progress Notes (Signed)
  Subjective:     Patient ID: Teresa Bright, female   DOB: [birth year], 34 y.o.   MRN: [id number]  HPI [name] is a 34 year old white female, married, G2P1001, in complaining of burning with urination. PCP is Raiford Noble PA.   Review of Systems Has burning with urination Periods brown and stringy then pink, not heavy, lasts 3-4 days, has happened since miscarriage in 05/2018.  No pain with sex  Reviewed past medical,surgical, social and family history. Reviewed medications and allergies.     Objective:   Physical Exam  BP (!) 131/94 (BP Location: Left Arm, Patient Position: Sitting, Cuff Size: Normal)   Pulse 64   Ht 5\' 4"  (1.626 m)   Wt 210 lb (95.3 kg)   LMP 06/20/2019 (LMP Unknown)   BMI 36.05 kg/m  Urine dipstick is negative. Skin warm and dry.Pelvic: external genitalia is normal in appearance no lesions, vagina: white discharge with slight odor,urethra has no lesions or masses noted, cervix:smooth and bulbous, uterus: normal size, shape and contour, non tender, no masses felt, adnexa: no masses or tenderness noted. Bladder is non tender and no masses felt. Wet prep: + for clue cells and +WBCs. Fall risk is low Examination chaperoned by Diona Fanti CMA.     Assessment:     1. Burning with urination  2. Vaginal discharge  3. BV (bacterial vaginosis) Will rx flagyl Meds ordered this encounter  Medications  . metroNIDAZOLE (FLAGYL) 500 MG tablet    Sig: Take 1 tablet (500 mg total) by mouth 2 (two) times daily.    Dispense:  14 tablet    Refill:  0    Order Specific Question:   Supervising Provider    Answer:   Florian Buff [[id number]]      Plan:     Follow up prn

## 2019-08-13 ENCOUNTER — Other Ambulatory Visit: Payer: Self-pay | Admitting: Obstetrics & Gynecology

## 2019-08-13 DIAGNOSIS — D3911 Neoplasm of uncertain behavior of right ovary: Secondary | ICD-10-CM

## 2019-08-28 ENCOUNTER — Ambulatory Visit (HOSPITAL_COMMUNITY)
Admission: RE | Admit: 2019-08-28 | Discharge: 2019-08-28 | Disposition: A | Discharge status: Home / Self Care | Payer: Managed Care, Other (non HMO) | Source: Ambulatory Visit | Attending: Obstetrics & Gynecology | Admitting: Obstetrics & Gynecology

## 2019-08-28 ENCOUNTER — Other Ambulatory Visit: Payer: Self-pay

## 2019-08-28 DIAGNOSIS — D3911 Neoplasm of uncertain behavior of right ovary: Secondary | ICD-10-CM | POA: Insufficient documentation

## 2019-08-28 MED ORDER — IOHEXOL 300 MG/ML  SOLN
100.0000 mL | Freq: Once | INTRAMUSCULAR | Status: AC | PRN
  Administered 2019-08-28: 16:00:00 100 mL via INTRAVENOUS

## 2019-10-09 ENCOUNTER — Other Ambulatory Visit: Payer: Self-pay | Admitting: Women's Health

## 2019-12-06 ENCOUNTER — Encounter: Payer: Self-pay | Admitting: Physician Assistant

## 2019-12-06 ENCOUNTER — Other Ambulatory Visit: Payer: Self-pay

## 2019-12-06 ENCOUNTER — Ambulatory Visit: Payer: Managed Care, Other (non HMO) | Admitting: Physician Assistant

## 2019-12-06 VITALS — BP 128/82 | HR 72 | Temp 98.4°F | Wt 215.2 lb

## 2019-12-06 DIAGNOSIS — G5602 Carpal tunnel syndrome, left upper limb: Secondary | ICD-10-CM

## 2019-12-06 MED ORDER — MELOXICAM 15 MG PO TABS
15.0000 mg | ORAL_TABLET | Freq: Every day | ORAL | 0 refills | Status: DC
Start: 2019-12-06 — End: 2020-01-11

## 2019-12-06 NOTE — Patient Instructions (Signed)
Please wear the wrist brace at night and during the day when able for the next 2 weeks. Elevate the arm while resting.  Avoid typing more than needed. Avoid using that hand to scroll on your phone.   Take the Meloxicam once daily with food. If causing heartburn we will need to stop this and shift gears.   Please keep me updated on how things are going. If not improving we will need to consider having a hand specialist take a look.   Hang in there!

## 2019-12-06 NOTE — Progress Notes (Signed)
Patient presents to clinic today c/o 2-1/2 days of numbness and tingling of her left hand with some radiation into the forearm.  Notes experiencing this previously but just intermittently, resolving with positional change.  Over the past few days has been more consistent.  States mainly affecting the first 3 fingers of her hand but when symptoms are more significant all 5 digits of her hand are affected.  Denies any trauma or injury.  Denies any shoulder pain or upper arm symptoms.  Denies any elbow pain.  Does type a lot for work.  Denies symptoms of her right hand.  Past Medical History:  Diagnosis Date  . GERD (gastroesophageal reflux disease)   . History of chickenpox   . Migraine   . Pregnant 08/20/2014    Current Outpatient Medications on File Prior to Visit  Medication Sig Dispense Refill  . Cholecalciferol (VITAMIN D3 PO) Take 1,000 mg by mouth daily.     . LO LOESTRIN FE 1 MG-10 MCG / 10 MCG tablet Take 1 tablet by mouth once daily 84 tablet 0  . metroNIDAZOLE (FLAGYL) 500 MG tablet Take 1 tablet (500 mg total) by mouth 2 (two) times daily. (Patient not taking: Reported on 12/06/2019) 14 tablet 0  . nystatin cream (MYCOSTATIN) Apply 1 application topically 2 (two) times daily. (Patient not taking: Reported on 07/04/2019) 30 g 0  . terbinafine (LAMISIL) 250 MG tablet Take 1 tablet (250 mg total) by mouth daily. (Patient not taking: Reported on 07/04/2019) 90 tablet 0  . Vitamin D, Ergocalciferol, (DRISDOL) 1.25 MG (50000 UT) CAPS capsule Take 1 capsule (50,000 Units total) by mouth every 7 (seven) days. (Patient not taking: Reported on 07/04/2019) 12 capsule 0   No current facility-administered medications on file prior to visit.    No Known Allergies  Family History  Problem Relation Age of Onset  . Hypertension Mother   . Hyperlipidemia Mother   . Anxiety disorder Mother   . Healthy Father   . Cancer Maternal Grandmother        skin ; also intestinal malignancy in 3s  .  Diabetes Maternal Grandmother   . Dementia Maternal Grandmother   . Stroke Maternal Grandfather   . Aneurysm Maternal Grandfather        brain  . Cancer Paternal Grandmother        bone and breast  . Mental illness Maternal Uncle   . Stroke Maternal Uncle     Social History   Socioeconomic History  . Marital status: Married    Spouse name: Not on file  . Number of children: 1  . Years of education: Not on file  . Highest education level: Not on file  Occupational History  . Not on file  Tobacco Use  . Smoking status: Never Smoker  . Smokeless tobacco: Never Used  Vaping Use  . Vaping Use: Never used  Substance and Sexual Activity  . Alcohol use: Not Currently    Alcohol/week: 0.0 standard drinks    Comment: occassionally   . Drug use: No  . Sexual activity: Yes    Partners: Male    Birth control/protection: Pill  Other Topics Concern  . Not on file  Social History Narrative  . Not on file   Social Determinants of Health   Financial Resource Strain:   . Difficulty of Paying Living Expenses:   Food Insecurity:   . Worried About Charity fundraiser in the Last Year:   . [employer] of  Food in the Last Year:   Transportation Needs:   . Film/video editor (Medical):   Marland Kitchen Lack of Transportation (Non-Medical):   Physical Activity:   . Days of Exercise per Week:   . Minutes of Exercise per Session:   Stress:   . Feeling of Stress :   Social Connections:   . Frequency of Communication with Friends and Family:   . Frequency of Social Gatherings with Friends and Family:   . Attends Religious Services:   . Active Member of Clubs or Organizations:   . Attends Archivist Meetings:   Marland Kitchen Marital Status:    Review of Systems - See HPI.  All other ROS are negative.  BP 128/82   Pulse 72   Temp 98.4 F (36.9 C) (Temporal)   Wt 215 lb 3.2 oz (97.6 kg)   SpO2 98%   BMI 36.94 kg/m   Physical Exam Vitals reviewed.  Constitutional:      Appearance: Normal  appearance.  HENT:     Head: Normocephalic and atraumatic.  Cardiovascular:     Rate and Rhythm: Normal rate and regular rhythm.     Pulses: Normal pulses.     Heart sounds: Normal heart sounds.  Pulmonary:     Effort: Pulmonary effort is normal.  Musculoskeletal:     Left elbow: Normal.     Left wrist: No swelling, deformity, effusion, tenderness, bony tenderness or snuff box tenderness. Normal range of motion.     Cervical back: Neck supple.     Comments: Positive Tinel and Phalen signs of left wrist.  Negative on right wrist.  Skin:    Capillary Refill: Capillary refill takes less than 2 seconds.  Neurological:     General: No focal deficit present.     Mental Status: She is alert.     Sensory: No sensory deficit.     Assessment/Plan: 1. Carpal tunnel syndrome, left Moderate to significant symptoms.  Only present to this degree over the past few days.  Range of motion and strength intact.  Extremities neurovascularly intact.  Wrist brace applied.  Recommend elevation of wrist.  She is to wear her brace nightly and throughout the day when able.  Will start Rx meloxicam to help reduce inflammation.  If not improving would have her see hand specialist.  This visit occurred during the SARS-CoV-2 public health emergency.  Safety protocols were in place, including screening questions prior to the visit, additional usage of staff PPE, and extensive cleaning of exam room while observing appropriate contact time as indicated for disinfecting solutions.     Leeanne Rio, PA-C

## 2019-12-26 ENCOUNTER — Encounter: Payer: Managed Care, Other (non HMO) | Admitting: Physician Assistant

## 2020-01-07 ENCOUNTER — Other Ambulatory Visit: Payer: Self-pay | Admitting: Women's Health

## 2020-01-11 ENCOUNTER — Encounter: Payer: Self-pay | Admitting: Physician Assistant

## 2020-01-11 ENCOUNTER — Ambulatory Visit (INDEPENDENT_AMBULATORY_CARE_PROVIDER_SITE_OTHER): Payer: Managed Care, Other (non HMO) | Admitting: Physician Assistant

## 2020-01-11 ENCOUNTER — Other Ambulatory Visit: Payer: Self-pay

## 2020-01-11 VITALS — BP 110/70 | HR 75 | Temp 97.6°F | Resp 14 | Ht 64.0 in | Wt 210.0 lb

## 2020-01-11 DIAGNOSIS — N62 Hypertrophy of breast: Secondary | ICD-10-CM

## 2020-01-11 DIAGNOSIS — Z1159 Encounter for screening for other viral diseases: Secondary | ICD-10-CM | POA: Diagnosis not present

## 2020-01-11 DIAGNOSIS — B372 Candidiasis of skin and nail: Secondary | ICD-10-CM

## 2020-01-11 DIAGNOSIS — Z0000 Encounter for general adult medical examination without abnormal findings: Secondary | ICD-10-CM

## 2020-01-11 DIAGNOSIS — G8929 Other chronic pain: Secondary | ICD-10-CM

## 2020-01-11 DIAGNOSIS — M546 Pain in thoracic spine: Secondary | ICD-10-CM | POA: Diagnosis not present

## 2020-01-11 LAB — COMPREHENSIVE METABOLIC PANEL
ALT: 19 U/L (ref 0–35)
AST: 12 U/L (ref 0–37)
Albumin: 4.3 g/dL (ref 3.5–5.2)
Alkaline Phosphatase: 55 U/L (ref 39–117)
BUN: 11 mg/dL (ref 6–23)
CO2: 28 mEq/L (ref 19–32)
Calcium: 9.1 mg/dL (ref 8.4–10.5)
Chloride: 106 mEq/L (ref 96–112)
Creatinine, Ser: 0.67 mg/dL (ref 0.40–1.20)
GFR: 100.32 mL/min (ref >60.00)
Glucose, Bld: 91 mg/dL (ref 70–99)
Potassium: 4.2 mEq/L (ref 3.5–5.1)
Sodium: 140 mEq/L (ref 135–145)
Total Bilirubin: 0.8 mg/dL (ref 0.2–1.2)
Total Protein: 6.5 g/dL (ref 6.0–8.3)

## 2020-01-11 LAB — LIPID PANEL
Cholesterol: 195 mg/dL (ref 0–200)
HDL: 43.9 mg/dL (ref >39.00)
LDL Cholesterol: 127 mg/dL — ABNORMAL HIGH (ref 0–99)
NonHDL: 150.62
Total CHOL/HDL Ratio: 4
Triglycerides: 118 mg/dL (ref 0.0–149.0)
VLDL: 23.6 mg/dL (ref 0.0–40.0)

## 2020-01-11 LAB — CBC WITH DIFFERENTIAL/PLATELET
Basophils Absolute: 0.1 10*3/uL (ref 0.0–0.1)
Basophils Relative: 1 % (ref 0.0–3.0)
Eosinophils Absolute: 0.1 10*3/uL (ref 0.0–0.7)
Eosinophils Relative: 1.4 % (ref 0.0–5.0)
HCT: 40.6 % (ref 36.0–46.0)
Hemoglobin: 13.6 g/dL (ref 12.0–15.0)
Lymphocytes Relative: 41 % (ref 12.0–46.0)
Lymphs Abs: 2.2 10*3/uL (ref 0.7–4.0)
MCHC: 33.6 g/dL (ref 30.0–36.0)
MCV: 88.5 fl (ref 78.0–100.0)
Monocytes Absolute: 0.4 10*3/uL (ref 0.1–1.0)
Monocytes Relative: 7.9 % (ref 3.0–12.0)
Neutro Abs: 2.6 10*3/uL (ref 1.4–7.7)
Neutrophils Relative %: 48.7 % (ref 43.0–77.0)
Platelets: 252 10*3/uL (ref 150.0–400.0)
RBC: 4.58 Mil/uL (ref 3.87–5.11)
RDW: 12.6 % (ref 11.5–15.5)
WBC: 5.4 10*3/uL (ref 4.0–10.5)

## 2020-01-11 LAB — HEMOGLOBIN A1C: Hgb A1c MFr Bld: 5.5 % (ref 4.6–6.5)

## 2020-01-11 NOTE — Patient Instructions (Signed)
Please go to the lab for blood work.   Our office will call you with your results unless you have chosen to receive results via MyChart.  If your blood work is normal we will follow-up each year for physicals and as scheduled for chronic medical problems.  If anything is abnormal we will treat accordingly and get you in for a follow-up.   Preventive Care 21-35 Years Old, Female Preventive care refers to visits with your health care provider and lifestyle choices that can promote health and wellness. This includes:  A yearly physical exam. This may also be called an annual well check.  Regular dental visits and eye exams.  Immunizations.  Screening for certain conditions.  Healthy lifestyle choices, such as eating a healthy diet, getting regular exercise, not using drugs or products that contain nicotine and tobacco, and limiting alcohol use. What can I expect for my preventive care visit? Physical exam Your health care provider will check your:  Height and weight. This may be used to calculate body mass index (BMI), which tells if you are at a healthy weight.  Heart rate and blood pressure.  Skin for abnormal spots. Counseling Your health care provider may ask you questions about your:  Alcohol, tobacco, and drug use.  Emotional well-being.  Home and relationship well-being.  Sexual activity.  Eating habits.  Work and work environment.  Method of birth control.  Menstrual cycle.  Pregnancy history. What immunizations do I need?  Influenza (flu) vaccine  This is recommended every year. Tetanus, diphtheria, and pertussis (Tdap) vaccine  You may need a Td booster every 10 years. Varicella (chickenpox) vaccine  You may need this if you have not been vaccinated. Human papillomavirus (HPV) vaccine  If recommended by your health care provider, you may need three doses over 6 months. Measles, mumps, and rubella (MMR) vaccine  You may need at least one dose of  MMR. You may also need a second dose. Meningococcal conjugate (MenACWY) vaccine  One dose is recommended if you are age 19-21 years and a first-year college student living in a residence hall, or if you have one of several medical conditions. You may also need additional booster doses. Pneumococcal conjugate (PCV13) vaccine  You may need this if you have certain conditions and were not previously vaccinated. Pneumococcal polysaccharide (PPSV23) vaccine  You may need one or two doses if you smoke cigarettes or if you have certain conditions. Hepatitis A vaccine  You may need this if you have certain conditions or if you travel or work in places where you may be exposed to hepatitis A. Hepatitis B vaccine  You may need this if you have certain conditions or if you travel or work in places where you may be exposed to hepatitis B. Haemophilus influenzae type b (Hib) vaccine  You may need this if you have certain conditions. You may receive vaccines as individual doses or as more than one vaccine together in one shot (combination vaccines). Talk with your health care provider about the risks and benefits of combination vaccines. What tests do I need?  Blood tests  Lipid and cholesterol levels. These may be checked every 5 years starting at age 20.  Hepatitis C test.  Hepatitis B test. Screening  Diabetes screening. This is done by checking your blood sugar (glucose) after you have not eaten for a while (fasting).  Sexually transmitted disease (STD) testing.  BRCA-related cancer screening. This may be done if you have a family history of   breast, ovarian, tubal, or peritoneal cancers.  Pelvic exam and Pap test. This may be done every 3 years starting at age 21. Starting at age 30, this may be done every 5 years if you have a Pap test in combination with an HPV test. Talk with your health care provider about your test results, treatment options, and if necessary, the need for more  tests. Follow these instructions at home: Eating and drinking   Eat a diet that includes fresh fruits and vegetables, whole grains, lean protein, and low-fat dairy.  Take vitamin and mineral supplements as recommended by your health care provider.  Do not drink alcohol if: ? Your health care provider tells you not to drink. ? You are pregnant, may be pregnant, or are planning to become pregnant.  If you drink alcohol: ? Limit how much you have to 0-1 drink a day. ? Be aware of how much alcohol is in your drink. In the [place] one drink equals one 12 oz bottle of beer (355 mL), one 5 oz glass of wine (148 mL), or one 1 oz glass of hard liquor (44 mL). Lifestyle  Take daily care of your teeth and gums.  Stay active. Exercise for at least 30 minutes on 5 or more days each week.  Do not use any products that contain nicotine or tobacco, such as cigarettes, e-cigarettes, and chewing tobacco. If you need help quitting, ask your health care provider.  If you are sexually active, practice safe sex. Use a condom or other form of birth control (contraception) in order to prevent pregnancy and STIs (sexually transmitted infections). If you plan to become pregnant, see your health care provider for a preconception visit. What's next?  Visit your health care provider once a year for a well check visit.  Ask your health care provider how often you should have your eyes and teeth checked.  Stay up to date on all vaccines. This information is not intended to replace advice given to you by your health care provider. Make sure you discuss any questions you have with your health care provider. Document Revised: 02/16/2018 Document Reviewed: 02/16/2018 Elsevier Patient Education  2020 Elsevier Inc. .      

## 2020-01-11 NOTE — Progress Notes (Signed)
Patient presents to clinic today for annual exam.  Patient is fasting for labs.  Diet -- Noted dietary choices have been hit or miss previously. Has recently started a 1500-1600 calore/day diet. Doing ok with this so far. Notes she needs to eat more fresh fruits and vegetables.   Exercise -- No regular exercise regimen. Has started walking daily but the heat has been bothersome for her.   Patient with very large breasts, enlarging even further with pregnancy and breastfeeding her son Sharene Butters. Notes history of neck/back pain, skin irritation with intertrigo and yeast infections. Is wanting to consider breast reduction surgery to help alleviate the medical issues her large breasts exacerbate.   Health Maintenance: Immunizations -- UTD PAP -- UTD. Followed by GYN -- Dr. Elonda Husky with [facility] in [place].  Past Medical History:  Diagnosis Date  . GERD (gastroesophageal reflux disease)   . History of chickenpox   . Migraine   . Pregnant 08/20/2014    Past Surgical History:  Procedure Laterality Date  . cysterectomy    . LAPAROSCOPIC OVARIAN CYSTECTOMY Right 03/03/2016   Procedure: LAPAROSCOPIC RIGHT OVARIAN CYSTECTOMY AND RIGHT OOPHORECTOMY;  Surgeon: Florian Buff, MD;  Location: [facility];  Service: Gynecology;  Laterality: Right;  . OOPHORECTOMY Right   . WISDOM TOOTH EXTRACTION      Current Outpatient Medications on File Prior to Visit  Medication Sig Dispense Refill  . Cholecalciferol (VITAMIN D3 PO) Take 1,000 mg by mouth daily.     . LO LOESTRIN FE 1 MG-10 MCG / 10 MCG tablet Take 1 tablet by mouth once daily 84 tablet 2   No current facility-administered medications on file prior to visit.    No Known Allergies  Family History  Problem Relation Age of Onset  . Hypertension Mother   . Hyperlipidemia Mother   . Anxiety disorder Mother   . Healthy Father   . Cancer Maternal Grandmother        skin ; also intestinal malignancy in 42s  . Diabetes Maternal  Grandmother   . Dementia Maternal Grandmother   . Stroke Maternal Grandfather   . Aneurysm Maternal Grandfather        brain  . Cancer Paternal Grandmother        bone and breast  . Mental illness Maternal Uncle   . Stroke Maternal Uncle     Social History   Socioeconomic History  . Marital status: Married    Spouse name: Not on file  . Number of children: 1  . Years of education: Not on file  . Highest education level: Not on file  Occupational History  . Not on file  Tobacco Use  . Smoking status: Never Smoker  . Smokeless tobacco: Never Used  Vaping Use  . Vaping Use: Never used  Substance and Sexual Activity  . Alcohol use: Not Currently    Alcohol/week: 0.0 standard drinks    Comment: occassionally   . Drug use: No  . Sexual activity: Yes    Partners: Male    Birth control/protection: Pill  Other Topics Concern  . Not on file  Social History Narrative  . Not on file   Social Determinants of Health   Financial Resource Strain:   . Difficulty of Paying Living Expenses:   Food Insecurity:   . Worried About Charity fundraiser in the Last Year:   . Arboriculturist in the Last Year:   Transportation Needs:   . Lack  of Transportation (Medical):   Marland Kitchen Lack of Transportation (Non-Medical):   Physical Activity:   . Days of Exercise per Week:   . Minutes of Exercise per Session:   Stress:   . Feeling of Stress :   Social Connections:   . Frequency of Communication with Friends and Family:   . Frequency of Social Gatherings with Friends and Family:   . Attends Religious Services:   . Active Member of Clubs or Organizations:   . Attends Archivist Meetings:   Marland Kitchen Marital Status:   Intimate Partner Violence:   . Fear of Current or Ex-Partner:   . Emotionally Abused:   Marland Kitchen Physically Abused:   . Sexually Abused:    Review of Systems  Constitutional: Negative for fever and weight loss.  HENT: Negative for ear discharge, ear pain, hearing loss and  tinnitus.   Eyes: Negative for blurred vision, double vision, photophobia and pain.  Respiratory: Negative for cough and shortness of breath.   Cardiovascular: Negative for chest pain and palpitations.  Gastrointestinal: Negative for abdominal pain, blood in stool, constipation, diarrhea, heartburn, melena, nausea and vomiting.  Genitourinary: Negative for dysuria, flank pain, frequency, hematuria and urgency.  Musculoskeletal: Negative for falls.  Neurological: Negative for dizziness, loss of consciousness and headaches.  Endo/Heme/Allergies: Negative for environmental allergies.  Psychiatric/Behavioral: Negative for depression, hallucinations, substance abuse and suicidal ideas. The patient is not nervous/anxious and does not have insomnia.    BP 110/70   Pulse 75   Temp 97.6 F (36.4 C) (Temporal)   Resp 14   Ht 5\' 4"  (1.626 m)   Wt (!) 210 lb (95.3 kg)   SpO2 98%   BMI 36.05 kg/m   Physical Exam Vitals reviewed.  HENT:     Head: Normocephalic and atraumatic.     Right Ear: Tympanic membrane, ear canal and external ear normal.     Left Ear: Tympanic membrane, ear canal and external ear normal.     Nose: Nose normal. No mucosal edema.     Mouth/Throat:     Pharynx: Uvula midline. No oropharyngeal exudate or posterior oropharyngeal erythema.  Eyes:     Conjunctiva/sclera: Conjunctivae normal.     Pupils: Pupils are equal, round, and reactive to light.  Neck:     Thyroid: No thyromegaly.  Cardiovascular:     Rate and Rhythm: Normal rate and regular rhythm.     Heart sounds: Normal heart sounds.  Pulmonary:     Effort: Pulmonary effort is normal. No respiratory distress.     Breath sounds: Normal breath sounds. No wheezing or rales.  Abdominal:     General: Bowel sounds are normal. There is no distension.     Palpations: Abdomen is soft. There is no mass.     Tenderness: There is no abdominal tenderness. There is no guarding or rebound.  Musculoskeletal:     Cervical  back: Neck supple.  Lymphadenopathy:     Cervical: No cervical adenopathy.  Skin:    General: Skin is warm and dry.     Findings: No rash.  Neurological:     Mental Status: She is alert and oriented to person, place, and time.     Cranial Nerves: No cranial nerve deficit.    Assessment/Plan: 1. Visit for preventive health examination Depression screen negative. Health Maintenance reviewed. Preventive schedule discussed and handout given in AVS. Will obtain fasting labs today.  - CBC with Differential/Platelet - Comprehensive metabolic panel - Lipid panel - Hemoglobin A1c  2. Need for hepatitis C screening test Due per USPSTF guidelines. Agrees to screen today - Hepatitis C Antibody  3. Large breasts 4. Chronic bilateral thoracic back pain 5. Yeast infection of the skin Large breasts causing and exacerbating numbers 4 and 5. Ongoing. No active rash at present. Has been working on diet and exercise. She would be a candidate for breast reduction giving size of breasts and the issues they are causing. She is to speak with insurance about steps to take to be considered for breast reduction as this is something she desires for medical reasons.   This visit occurred during the SARS-CoV-2 public health emergency.  Safety protocols were in place, including screening questions prior to the visit, additional usage of staff PPE, and extensive cleaning of exam room while observing appropriate contact time as indicated for disinfecting solutions.     Leeanne Rio, PA-C

## 2020-01-13 DIAGNOSIS — B372 Candidiasis of skin and nail: Secondary | ICD-10-CM | POA: Insufficient documentation

## 2020-01-13 DIAGNOSIS — G8929 Other chronic pain: Secondary | ICD-10-CM | POA: Insufficient documentation

## 2020-01-13 DIAGNOSIS — N62 Hypertrophy of breast: Secondary | ICD-10-CM | POA: Insufficient documentation

## 2020-01-14 LAB — HEPATITIS C ANTIBODY
Hepatitis C Ab: NONREACTIVE
SIGNAL TO CUT-OFF: 0.04 (ref <1.00)

## 2020-02-06 ENCOUNTER — Other Ambulatory Visit: Payer: Self-pay | Admitting: Physician Assistant

## 2020-02-06 DIAGNOSIS — N62 Hypertrophy of breast: Secondary | ICD-10-CM

## 2020-04-22 ENCOUNTER — Ambulatory Visit: Payer: Managed Care, Other (non HMO) | Admitting: Plastic Surgery

## 2020-04-22 ENCOUNTER — Encounter: Payer: Self-pay | Admitting: Plastic Surgery

## 2020-04-22 ENCOUNTER — Other Ambulatory Visit: Payer: Self-pay

## 2020-04-22 VITALS — BP 148/82 | HR 87 | Temp 98.1°F | Ht 64.0 in | Wt 220.6 lb

## 2020-04-22 DIAGNOSIS — N62 Hypertrophy of breast: Secondary | ICD-10-CM | POA: Diagnosis not present

## 2020-04-22 DIAGNOSIS — M542 Cervicalgia: Secondary | ICD-10-CM | POA: Diagnosis not present

## 2020-04-22 DIAGNOSIS — M546 Pain in thoracic spine: Secondary | ICD-10-CM | POA: Diagnosis not present

## 2020-04-22 DIAGNOSIS — G8929 Other chronic pain: Secondary | ICD-10-CM | POA: Diagnosis not present

## 2020-04-22 NOTE — Progress Notes (Signed)
Patient ID: Teresa Bright, female    DOB: [birth year], 34 y.o.   MRN: [id number]   Chief Complaint  Patient presents with  . Consult    Mammary Hyperplasia: The patient is a 34 y.o. female with a history of mammary hyperplasia for several years.  She has extremely large breasts causing symptoms that include the following: Back pain in the upper and lower back, including neck pain. She pulls or pins her bra straps to provide better lift and relief of the pressure and pain. She notices relief by holding her breast up manually.  Her shoulder straps cause grooves and pain and pressure that requires padding for relief. Pain medication is sometimes required with motrin and tylenol.  Activities that are hindered by enlarged breasts include: exercise and running.  She has tried supportive clothing as well as fitted bras without improvement.  Her breasts are extremely large and fairly symmetric with the left slightly longer than the right.  She has hyperpigmentation of the inframammary area on both sides.  The sternal to nipple distance on the right is 42 cm and the left is 45 cm.  The IMF distance is 20 cm.  She is 5 feet 4 inches tall and weighs 220 pounds.  Preoperative bra size = 38-40 H cup. She would like to be a C cup.  The estimated excess breast tissue to be removed at the time of surgery = 700 grams on the left and 700 grams on the right.  Mammogram history: none.  Family history of breast cancer:  Paternal grandmother.  Tobacco use:  Not a smoker and does not have Diabetes.  She has not had Chiropractor or physical therapy treatment yet. Her job is computer work in Therapist, music.   Review of Systems  Constitutional: Positive for activity change. Negative for appetite change.  Eyes: Negative.   Respiratory: Negative.  Negative for chest tightness and shortness of breath.   Cardiovascular: Negative.  Negative for chest pain.  Gastrointestinal: Negative.  Negative for abdominal distention and  abdominal pain.  Endocrine: Negative.   Genitourinary: Negative.   Musculoskeletal: Positive for back pain and neck pain.  Skin: Positive for color change and rash.  Neurological: Negative.   Hematological: Negative.   Psychiatric/Behavioral: Negative.     Past Medical History:  Diagnosis Date  . GERD (gastroesophageal reflux disease)   . History of chickenpox   . Migraine   . Pregnant 08/20/2014    Past Surgical History:  Procedure Laterality Date  . cysterectomy    . LAPAROSCOPIC OVARIAN CYSTECTOMY Right 03/03/2016   Procedure: LAPAROSCOPIC RIGHT OVARIAN CYSTECTOMY AND RIGHT OOPHORECTOMY;  Surgeon: Florian Buff, MD;  Location: [facility];  Service: Gynecology;  Laterality: Right;  . OOPHORECTOMY Right   . WISDOM TOOTH EXTRACTION        Current Outpatient Medications:  .  Cholecalciferol (VITAMIN D3 PO), Take 1,000 mg by mouth daily. , Disp: , Rfl:  .  LO LOESTRIN FE 1 MG-10 MCG / 10 MCG tablet, Take 1 tablet by mouth once daily, Disp: 84 tablet, Rfl: 2   Objective:   Vitals:   04/22/20 1433  BP: (!) 148/82  Pulse: 87  Temp: 98.1 F (36.7 C)  SpO2: 97%    Physical Exam Vitals and nursing note reviewed.  Constitutional:      Appearance: Normal appearance.  HENT:     Head: Normocephalic and atraumatic.  Eyes:     Extraocular Movements: Extraocular movements intact.  Cardiovascular:  Rate and Rhythm: Normal rate.     Pulses: Normal pulses.  Pulmonary:     Effort: Pulmonary effort is normal. No respiratory distress.  Abdominal:     General: Abdomen is flat. There is no distension.     Hernia: No hernia is present.  Musculoskeletal:     Cervical back: Normal range of motion.  Skin:    General: Skin is warm.     Capillary Refill: Capillary refill takes less than 2 seconds.  Neurological:     General: No focal deficit present.     Mental Status: She is alert and oriented to person, place, and time.  Psychiatric:        Mood and Affect: Mood normal.         Behavior: Behavior normal.        Thought Content: Thought content normal.     Assessment & Plan:  Chronic bilateral thoracic back pain  Large breasts  Neck pain  The patient is a very very good candidate for bilateral breast reduction with possible liposuction.  Due to her finding and overall size she would benefit greatly from the reduction.  She is willing to see the healthy weight and wellness doctors for reducing her weight.  I think this would be very helpful.  We will also work on a physical therapy evaluation.  I also recommend a mammogram preoperatively.  We discussed the possibility for nipple areola changing sensation after surgery and the inability to breast-feed especially if she has the amputation technique.  We discussed the look of the scars.  This does not increase or decrease her risk of breast cancer in the future.  The patient knows to call us once she has finished physical therapy.  Pictures were obtained of the patient and placed in the chart with the patient's or guardian's permission.  [place], DO

## 2020-04-30 ENCOUNTER — Encounter: Payer: Self-pay | Admitting: Plastic Surgery

## 2020-05-04 ENCOUNTER — Encounter (INDEPENDENT_AMBULATORY_CARE_PROVIDER_SITE_OTHER): Payer: Self-pay

## 2020-05-05 NOTE — Telephone Encounter (Signed)
This has been handled. Patient and I discussed plan of action and the most recent message regarding the referral.

## 2020-05-09 ENCOUNTER — Ambulatory Visit (HOSPITAL_COMMUNITY): Payer: Managed Care, Other (non HMO) | Attending: Plastic Surgery | Admitting: Physical Therapy

## 2020-05-09 ENCOUNTER — Other Ambulatory Visit: Payer: Self-pay

## 2020-05-09 ENCOUNTER — Encounter (HOSPITAL_COMMUNITY): Payer: Self-pay | Admitting: Physical Therapy

## 2020-05-09 DIAGNOSIS — M6281 Muscle weakness (generalized): Secondary | ICD-10-CM

## 2020-05-09 DIAGNOSIS — M546 Pain in thoracic spine: Secondary | ICD-10-CM | POA: Insufficient documentation

## 2020-05-09 NOTE — Therapy (Signed)
[place], [place], [address] Phone: [phone]   Fax:  [phone]  Physical Therapy Evaluation  Patient Details  Name: Teresa Bright MRN: [id number] Date of Birth: 1986/01/06 Referring Provider (PT): Audelia Hives   Encounter Date: 05/09/2020   PT End of Session - 05/09/20 1525    Visit Number 1    Number of Visits 6    Date for PT Re-Evaluation 07/04/20    Authorization Type cigna managed, no auth required, 60 VL    Progress Note Due on Visit 6    PT Start Time 1530    PT Stop Time 1610    PT Time Calculation (min) 40 min    Activity Tolerance Patient tolerated treatment well    Behavior During Therapy [facility] for tasks assessed/performed           Past Medical History:  Diagnosis Date  . GERD (gastroesophageal reflux disease)   . History of chickenpox   . Migraine   . Pregnant 08/20/2014    Past Surgical History:  Procedure Laterality Date  . cysterectomy    . LAPAROSCOPIC OVARIAN CYSTECTOMY Right 03/03/2016   Procedure: LAPAROSCOPIC RIGHT OVARIAN CYSTECTOMY AND RIGHT OOPHORECTOMY;  Surgeon: Florian Buff, MD;  Location: [facility];  Service: Gynecology;  Laterality: Right;  . OOPHORECTOMY Right   . WISDOM TOOTH EXTRACTION      There were no vitals filed for this visit.        [facility] PT Assessment - 05/09/20 0001      Assessment   Medical Diagnosis thoracic pain    Referring Provider (PT) Lyndee Leo Dillingham    Hand Dominance Right    Next MD Visit to call to schedule    Prior Therapy no      Precautions   Precautions None      Restrictions   Weight Bearing Restrictions No      Balance Screen   Has the patient fallen in the past 6 months No      [place] residence    Living Arrangements Spouse/significant other    Available Help at Discharge Family    Type of [place] to enter    Entrance Stairs-Number of Steps 5      Entrance Stairs-Rails Can reach both    [place] Two level      Prior Function   Level of Independence Independent    Vocation Full time employment    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, outdoor activities      Cognition   Overall Cognitive Status Within [place] for tasks assessed      Observation/Other Assessments   Focus on Therapeutic Outcomes (FOTO)  47% function       ROM / Strength   AROM / PROM / Strength AROM;Strength      AROM   AROM Assessment Site Shoulder    Right/Left Shoulder Left;Right    Right Shoulder Flexion 170 Degrees   tightness in shoulder   Right Shoulder ABduction 170 Degrees   tightness in shoulder   Right Shoulder Internal Rotation --   to T6 SP    Right Shoulder External Rotation --   to T2 SP    Left Shoulder Flexion 170 Degrees   tightness in shoulder   Left Shoulder ABduction 170 Degrees   tightness in shoulder   Left Shoulder Internal Rotation --  T 10 SP    Left Shoulder External Rotation --   to left upper scapular border     Strength   Strength Assessment Site Shoulder    Right/Left Shoulder Right;Left    Right Shoulder Flexion 4+/5   tightness in shoulder   Right Shoulder ABduction 4+/5    Right Shoulder Internal Rotation 4+/5    Right Shoulder External Rotation 4-/5   tightness in shoulder   Left Shoulder Flexion 4/5   tightness in shoulder   Left Shoulder ABduction 4+/5    Left Shoulder Internal Rotation 4+/5    Left Shoulder External Rotation 4/5   tightness in shoulder     Palpation   Palpation comment Tenderness/tightness/soreness in bilateral upper traps, rhomboids and thoracic paraspinals. Increased resting tone noted in all muscles                      Objective measurements completed on examination: See above findings.       [facility] Adult PT Treatment/Exercise - 05/09/20 0001      Exercises   Exercises Shoulder      Shoulder Exercises: Standing    Other Standing Exercises shoulder ER standing at wall 4x5 5" holds B                  PT Education - 05/09/20 1614    Education Details educated on objective information, FOTO results, HEP, plan of PT, benefits of improving work setup and ulnar nerve anatomy (per question about ulnar nerve symptoms)    Person(s) Educated Patient    Methods Explanation    Comprehension Verbalized understanding            PT Short Term Goals - 05/09/20 1526      PT SHORT TERM GOAL #1   Title Patient will be independent in self management strategies to improve quality of life and functional outcomes.    Time 4    Period Weeks    Status New    Target Date 06/06/20      PT SHORT TERM GOAL #2   Title Patient will report at least 50% improvement in overall symptoms and/or function to demonstrate improved functional mobility    Time 4    Period Weeks    Status New    Target Date 06/06/20             PT Long Term Goals - 05/09/20 1526      PT LONG TERM GOAL #1   Title Patient will report at least 75% improvement in overall symptoms and/or function to demonstrate improved functional mobility    Time 8    Period Weeks    Status New    Target Date 07/04/20      PT LONG TERM GOAL #2   Title Patient will improve on FOTO score to meet predicted outcomes to demonstrate improved functional mobility.    Time 8    Period Weeks    Status New    Target Date 07/04/20      PT LONG TERM GOAL #3   Time --    Period --    Status --    Target Date --                  Plan - 05/09/20 1527    Clinical Impression Statement Patient presents to therapy with complaints of bilateral shoulder and thoracic pain that has been going for a while. Primary complaint is intense tightness  across her shoulders and upper back. Increased resting tone noted in shoulder and back muscles. Educated patient in benefits of skilled physical therapy and how physical therapy will help with current symptoms.  Patient would greatly benefit from skilled physical therapy to improve quality life, return her to more active lifestyle and improve her overall function.    Examination-Activity Limitations Sleep;Stairs;Stand;Lift;Reach Overhead;Carry    Examination-Participation Restrictions Cleaning;Driving;Occupation;Meal Prep    Stability/Clinical Decision Making Stable/Uncomplicated    Clinical Decision Making Low    Rehab Potential Good    PT Frequency Other (comment)   2-7P/OEUM over 8 week certification period for total of 6 visits   PT Duration 8 weeks    PT Treatment/Interventions ADLs/Self Care Home Management;Moist Heat;Traction;Electrical Stimulation;Cryotherapy;Neuromuscular re-education;Patient/family education;Manual techniques;Dry needling;Joint Manipulations    PT Next Visit Plan review pictures for home desk/work setup, posture exercises, pec stretch, thoracic mobility, posterior chain strengthening    PT Home Exercise Plan shoulder ER at wall    Consulted and Agree with Plan of Care Patient           Patient will benefit from skilled therapeutic intervention in order to improve the following deficits and impairments:  Pain, Decreased strength, Decreased range of motion, Decreased activity tolerance, Postural dysfunction  Visit Diagnosis: Pain in thoracic spine  Muscle weakness (generalized)     Problem List Patient Active Problem List   Diagnosis Date Noted  . Neck pain 04/22/2020  . Symptomatic mammary hypertrophy 01/13/2020  . Chronic bilateral thoracic back pain 01/13/2020  . Yeast infection of the skin 01/13/2020  . BV (bacterial vaginosis) 07/04/2019  . Vaginal discharge 07/04/2019  . Burning with urination 07/04/2019  . Visit for preventive health examination 12/22/2018  . Onychomycosis 12/22/2018  . Class 2 obesity due to excess calories without serious comorbidity with body mass index (BMI) of 37.0 to 37.9 in adult 12/22/2018  . Cervical high risk HPV (human  papillomavirus) test positive 10/15/2016  . 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 Ovarian cyst, right 10/03/2014    4:21 PM, 05/09/20 Jerene Pitch, DPT Physical Therapy with [facility]  [phone] office  [place] [address] [place], [place], [address] Phone: [phone]   Fax:  [phone]  Name: Teresa Bright MRN: [id number] Date of Birth: 1985-11-23

## 2020-05-09 NOTE — Patient Instructions (Signed)
Access Code: [email or web address] URL: [email or web address] Date: 05/09/2020 Prepared by: Yetta Glassman  Exercises Shoulder External Rotation - 1 x daily - 7 x weekly - 4 sets - 5 reps - 5 hold

## 2020-05-14 ENCOUNTER — Encounter (HOSPITAL_COMMUNITY): Payer: Self-pay

## 2020-05-14 ENCOUNTER — Ambulatory Visit (HOSPITAL_COMMUNITY): Payer: Managed Care, Other (non HMO)

## 2020-05-14 ENCOUNTER — Other Ambulatory Visit: Payer: Self-pay

## 2020-05-14 ENCOUNTER — Encounter: Payer: Self-pay | Admitting: Physician Assistant

## 2020-05-14 ENCOUNTER — Ambulatory Visit (INDEPENDENT_AMBULATORY_CARE_PROVIDER_SITE_OTHER): Payer: Managed Care, Other (non HMO) | Admitting: Physician Assistant

## 2020-05-14 VITALS — BP 122/84 | HR 78 | Temp 98.2°F | Resp 16 | Ht 64.0 in | Wt 224.0 lb

## 2020-05-14 DIAGNOSIS — E6609 Other obesity due to excess calories: Secondary | ICD-10-CM | POA: Diagnosis not present

## 2020-05-14 DIAGNOSIS — Z6837 Body mass index (BMI) 37.0-37.9, adult: Secondary | ICD-10-CM | POA: Diagnosis not present

## 2020-05-14 DIAGNOSIS — M546 Pain in thoracic spine: Secondary | ICD-10-CM

## 2020-05-14 DIAGNOSIS — M6281 Muscle weakness (generalized): Secondary | ICD-10-CM

## 2020-05-14 DIAGNOSIS — E66812 Obesity, class 2: Secondary | ICD-10-CM

## 2020-05-14 DIAGNOSIS — Z23 Encounter for immunization: Secondary | ICD-10-CM | POA: Diagnosis not present

## 2020-05-14 NOTE — Therapy (Signed)
[place], [place], [address] Phone: [phone]   Fax:  [phone]  Physical Therapy Treatment  Patient Details  Name: Teresa Bright MRN: [id number] Date of Birth: Dec 11, 1985 Referring Provider (PT): Audelia Hives   Encounter Date: 05/14/2020   PT End of Session - 05/14/20 1403    Visit Number 2    Number of Visits 6    Date for PT Re-Evaluation 07/04/20    Authorization Type cigna managed, no auth required, 60 VL    Progress Note Due on Visit 6    PT Start Time 1401    PT Stop Time 1439    PT Time Calculation (min) 38 min    Activity Tolerance Patient tolerated treatment well    Behavior During Therapy [facility] for tasks assessed/performed           Past Medical History:  Diagnosis Date  . GERD (gastroesophageal reflux disease)   . History of chickenpox   . Migraine   . Pregnant 08/20/2014    Past Surgical History:  Procedure Laterality Date  . cysterectomy    . LAPAROSCOPIC OVARIAN CYSTECTOMY Right 03/03/2016   Procedure: LAPAROSCOPIC RIGHT OVARIAN CYSTECTOMY AND RIGHT OOPHORECTOMY;  Surgeon: Florian Buff, MD;  Location: [facility];  Service: Gynecology;  Laterality: Right;  . OOPHORECTOMY Right   . WISDOM TOOTH EXTRACTION      There were no vitals filed for this visit.   Subjective Assessment - 05/14/20 1402    Subjective Pt stated she feels good today, no reports of pain.  Does report some tightness over top of shoulders.  Has began stretches at home daily.    Currently in Pain? No/denies   Bil shoulder tightness                            [facility] Adult PT Treatment/Exercise - 05/14/20 0001      Posture/Postural Control   Posture/Postural Control Postural limitations    Postural Limitations Forward head;Rounded Shoulders      Exercises   Exercises Shoulder      Shoulder Exercises: Seated   Retraction Both;10 reps    Retraction Limitations 3" holds cervical and  scapular retraction    External Rotation Both;10 reps    Other Seated Exercises 3D thoracic excursion 5x each    Other Seated Exercises wback      Shoulder Exercises: Standing   Extension 10 reps;Theraband    Theraband Level (Shoulder Extension) Level 2 (Red)    Row Both;10 reps;Theraband    Theraband Level (Shoulder Row) Level 2 (Red)    Retraction 10 reps;Theraband    Theraband Level (Shoulder Retraction) Level 2 (Red)    Other Standing Exercises L2 RTB pull down 10x     Other Standing Exercises wall arch 10x      Shoulder Exercises: Stretch   Corner Stretch 3 reps;30 seconds                  PT Education - 05/14/20 1408    Education Details Reviewed goals, educated importance of seated posture for desk/ talking on phone, included lumbar support with towel.  Discussed appropriate bra fitting.  Educated importance of HEP compliance, pt able to recall and demonstrate appropriate.    Person(s) Educated Patient    Methods Explanation;Demonstration;Handout    Comprehension Verbalized understanding;Returned demonstration            PT Short Term Goals -  05/09/20 1526      PT SHORT TERM GOAL #1   Title Patient will be independent in self management strategies to improve quality of life and functional outcomes.    Time 4    Period Weeks    Status New    Target Date 06/06/20      PT SHORT TERM GOAL #2   Title Patient will report at least 50% improvement in overall symptoms and/or function to demonstrate improved functional mobility    Time 4    Period Weeks    Status New    Target Date 06/06/20             PT Long Term Goals - 05/09/20 1526      PT LONG TERM GOAL #1   Title Patient will report at least 75% improvement in overall symptoms and/or function to demonstrate improved functional mobility    Time 8    Period Weeks    Status New    Target Date 07/04/20      PT LONG TERM GOAL #2   Title Patient will improve on FOTO score to meet predicted outcomes  to demonstrate improved functional mobility.    Time 8    Period Weeks    Status New    Target Date 07/04/20      PT LONG TERM GOAL #3   Time --    Period --    Status --    Target Date --                 Plan - 05/14/20 1439    Clinical Impression Statement Reviewed goals, educated importance of HEP compliance.  Pt able to recall and demonstrate appropriate mechanics wiht current HEP.  Pt educated importance of proper posture and reviewed proper form with seated posture at desk, included lumbar support with towel.  Added thoracic mobility, pec stretch and postural strenghtening exercises with theraband.  Pt able to complete all exercises with min cueing to reduce compensation with UT and forwarded rolled shoulders upon rest.  No reports of pain through session.    Examination-Activity Limitations Sleep;Stairs;Stand;Lift;Reach Overhead;Carry    Examination-Participation Restrictions Cleaning;Driving;Occupation;Meal Prep    Stability/Clinical Decision Making Stable/Uncomplicated    Clinical Decision Making Low    Rehab Potential Good    PT Frequency --   6-7Y/PPJK over 8 week certification period for total of 6 visits   PT Duration 8 weeks    PT Treatment/Interventions ADLs/Self Care Home Management;Moist Heat;Traction;Electrical Stimulation;Cryotherapy;Neuromuscular re-education;Patient/family education;Manual techniques;Dry needling;Joint Manipulations    PT Next Visit Plan Answer any questions concerning home desk/work setup, posture exercises, pec stretch, thoracic mobility, posterior chain strengthening    PT Home Exercise Plan shoulder ER at wall; 05/14/20: cervical retraction and wback           Patient will benefit from skilled therapeutic intervention in order to improve the following deficits and impairments:  Pain, Decreased strength, Decreased range of motion, Decreased activity tolerance, Postural dysfunction  Visit Diagnosis: Pain in thoracic spine  Muscle  weakness (generalized)     Problem List Patient Active Problem List   Diagnosis Date Noted  . Neck pain 04/22/2020  . Symptomatic mammary hypertrophy 01/13/2020  . Chronic bilateral thoracic back pain 01/13/2020  . Yeast infection of the skin 01/13/2020  . BV (bacterial vaginosis) 07/04/2019  . Vaginal discharge 07/04/2019  . Burning with urination 07/04/2019  . Visit for preventive health examination 12/22/2018  . Onychomycosis 12/22/2018  . Class 2 obesity  due to excess calories without serious comorbidity with body mass index (BMI) of 37.0 to 37.9 in adult 12/22/2018  . Cervical high risk HPV (human papillomavirus) test positive 10/15/2016  . 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 Ovarian cyst, right 10/03/2014   Ihor Austin, LPTA/CLT; CBIS [phone]  Aldona Lento 05/14/2020, 3:03 PM  [place] [address] [place], [place], [address] Phone: [phone]   Fax:  [phone]  Name: Etsuko Dierolf MRN: [id number] Date of Birth: [birth year]

## 2020-05-14 NOTE — Patient Instructions (Signed)
For reflux, please take the Omeprazole daily for 2 weeks. Try to follow dietary recommendations below. Avoid late-night eating and your trigger foods.  Start a daily probiotic (Align, Culturelle or Digestive Advantage are some good brands). Let me know if symptoms are not improving.  With dietary changes our goal is after two weeks to see if you can maintain control of symptoms with the Omeprazole on an as-needed basis or switching to OTC Famotidine (Pepcid).   For the weight: - I want you to work towards a goal of 150 minutes per week of moderate-intensity aerobic exercise. This is anything not too strenuous but that gets your heart rate up a bit. Can include a quick 10-15 minute brisk walk, cardio tape or class, playing outside with your kiddos.  Please keep an exercise log to bring with you to your follow-up appointment.  - I also want you to start by keeping a 24 log of anything you eat, just writing down the food itself. You do not have to write all of the calories down yet; I just want you to see what all you are taking in and where we can make the most impactful changes. You can upload this to your Mychart from your phone or computer. I will review and we will start making changes. - In general I want you to eat more fresh or frozen fruits and vegetables. Limit red meat and stick to lean proteins like chicken, fish, beans.   We will follow-up via video visit in 3-6 weeks (whatever works best for you for the holidays).

## 2020-05-14 NOTE — Progress Notes (Signed)
Patient presents to clinic today at the request of her surgeon Dr. Elisabeth Cara to begin a diet and exercise program to assist with weight loss.  She is a tentative candidate for breast reduction surgery due to issues with chronic back pain thought to be secondary to large breasts.  Patient endorses previously trying 21-day fix and weight watchers.  No current dietary regimen.  States she wants to begin making these lifestyle changes after the upcoming Thanksgiving holiday.  States before she did well on a calorie restricted diet limiting to 1500 mg/day but she was not focusing on what type of calories she gets in.  Denies any current exercise regimen other than playing with her son, Sharene Butters.   Past Medical History:  Diagnosis Date  . GERD (gastroesophageal reflux disease)   . History of chickenpox   . Migraine   . Pregnant 08/20/2014    Current Outpatient Medications on File Prior to Visit  Medication Sig Dispense Refill  . Cholecalciferol (VITAMIN D3 PO) Take 1,000 mg by mouth daily.     . LO LOESTRIN FE 1 MG-10 MCG / 10 MCG tablet Take 1 tablet by mouth once daily 84 tablet 2   No current facility-administered medications on file prior to visit.    No Known Allergies  Family History  Problem Relation Age of Onset  . Hypertension Mother   . Hyperlipidemia Mother   . Anxiety disorder Mother   . Healthy Father   . Cancer Maternal Grandmother        skin ; also intestinal malignancy in 66s  . Diabetes Maternal Grandmother   . Dementia Maternal Grandmother   . Stroke Maternal Grandfather   . Aneurysm Maternal Grandfather        brain  . Cancer Paternal Grandmother        bone and breast  . Mental illness Maternal Uncle   . Stroke Maternal Uncle     Social History   Socioeconomic History  . Marital status: Married    Spouse name: Not on file  . Number of children: 1  . Years of education: Not on file  . Highest education level: Not on file  Occupational History  . Not on  file  Tobacco Use  . Smoking status: Never Smoker  . Smokeless tobacco: Never Used  Vaping Use  . Vaping Use: Never used  Substance and Sexual Activity  . Alcohol use: Not Currently    Alcohol/week: 0.0 standard drinks    Comment: occassionally   . Drug use: No  . Sexual activity: Yes    Partners: Male    Birth control/protection: Pill  Other Topics Concern  . Not on file  Social History Narrative  . Not on file   Social Determinants of Health   Financial Resource Strain:   . Difficulty of Paying Living Expenses: Not on file  Food Insecurity:   . Worried About Charity fundraiser in the Last Year: Not on file  . Ran Out of Food in the Last Year: Not on file  Transportation Needs:   . Lack of Transportation (Medical): Not on file  . Lack of Transportation (Non-Medical): Not on file  Physical Activity:   . Days of Exercise per Week: Not on file  . Minutes of Exercise per Session: Not on file  Stress:   . Feeling of Stress : Not on file  Social Connections:   . Frequency of Communication with Friends and Family: Not on file  . Frequency of  Social Gatherings with Friends and Family: Not on file  . Attends Religious Services: Not on file  . Active Member of Clubs or Organizations: Not on file  . Attends Archivist Meetings: Not on file  . Marital Status: Not on file   Review of Systems - See HPI.  All other ROS are negative.  BP 122/84   Pulse 78   Temp 98.2 F (36.8 C) (Temporal)   Resp 16   Ht 5\' 4"  (1.626 m)   Wt 224 lb (101.6 kg)   SpO2 98%   BMI 38.45 kg/m   Physical Exam Vitals reviewed.  Constitutional:      Appearance: Normal appearance. She is obese.  HENT:     Head: Normocephalic and atraumatic.  Cardiovascular:     Rate and Rhythm: Normal rate and regular rhythm.     Pulses: Normal pulses.     Heart sounds: Normal heart sounds.  Pulmonary:     Effort: Pulmonary effort is normal.     Breath sounds: Normal breath sounds.    Musculoskeletal:     Cervical back: Neck supple.  Neurological:     General: No focal deficit present.     Mental Status: She is alert and oriented to person, place, and time.  Psychiatric:        Mood and Affect: Mood normal.     Assessment/Plan: 1. Class 2 obesity due to excess calories without serious comorbidity with body mass index (BMI) of 37.0 to 37.9 in adult We will start by having patient keep a 24-hour food journal, simply listing all intake without calories macronutrients.  This is to help her see exactly what she is taking and if there are times a day or intake is worse than others.  Discussed mostly plant-based diet with lean animal protein.  She is looking into adding the NOOM program to help with weight loss.  Discussed to feel this is a good idea since it focuses on psychological components surrounding food intake and weight loss/gain as well as focusing on eating foods that have a lower caloric density.  Meal planning guide given.  Goal for exercise is 150 minutes/week.  Discussed that exercise only helps slightly with weight loss unless she is getting in greater than 250 minutes a week for exercise.  The main focus will be on diet and making long-term lifestyle changes.  Close follow-up scheduled.  2. Need for immunization against influenza Flu shot updated today. - Flu Vaccine QUAD 36+ mos IM  This visit occurred during the SARS-CoV-2 public health emergency.  Safety protocols were in place, including screening questions prior to the visit, additional usage of staff PPE, and extensive cleaning of exam room while observing appropriate contact time as indicated for disinfecting solutions.     Leeanne Rio, PA-C

## 2020-05-14 NOTE — Patient Instructions (Signed)
Flexibility: Neck Retraction    Pull head straight back, keeping eyes and jaw level. Repeat 10 times per set. Do 2 sets per session. Do 4 sessions per minutes.  http://orth.exer.us/345   Copyright  VHI. All rights reserved.   Scapular Retraction: Abduction (Standing)    With arms elevated and elbows bent to 90, pinch shoulder blades together and press arms back. Repeat 10 times per set. Do 2 sets per session. Do 4 sessions per week.  http://orth.exer.us/951   Copyright  VHI. All rights reserved.

## 2020-05-19 ENCOUNTER — Encounter: Payer: Self-pay | Admitting: Physician Assistant

## 2020-05-21 ENCOUNTER — Other Ambulatory Visit: Payer: Self-pay

## 2020-05-21 ENCOUNTER — Other Ambulatory Visit: Payer: Self-pay | Admitting: Plastic Surgery

## 2020-05-21 ENCOUNTER — Ambulatory Visit
Admission: RE | Admit: 2020-05-21 | Discharge: 2020-05-21 | Disposition: A | Discharge status: Home / Self Care | Payer: Managed Care, Other (non HMO) | Source: Ambulatory Visit | Attending: Plastic Surgery | Admitting: Plastic Surgery

## 2020-05-21 DIAGNOSIS — M542 Cervicalgia: Secondary | ICD-10-CM

## 2020-05-21 DIAGNOSIS — G8929 Other chronic pain: Secondary | ICD-10-CM

## 2020-05-21 DIAGNOSIS — N62 Hypertrophy of breast: Secondary | ICD-10-CM

## 2020-05-21 DIAGNOSIS — R928 Other abnormal and inconclusive findings on diagnostic imaging of breast: Secondary | ICD-10-CM

## 2020-05-21 DIAGNOSIS — N6310 Unspecified lump in the right breast, unspecified quadrant: Secondary | ICD-10-CM

## 2020-05-23 ENCOUNTER — Encounter (HOSPITAL_COMMUNITY): Payer: Self-pay

## 2020-05-23 ENCOUNTER — Other Ambulatory Visit: Payer: Self-pay

## 2020-05-23 ENCOUNTER — Ambulatory Visit (HOSPITAL_COMMUNITY): Payer: Managed Care, Other (non HMO) | Attending: Plastic Surgery

## 2020-05-23 DIAGNOSIS — M546 Pain in thoracic spine: Secondary | ICD-10-CM | POA: Diagnosis present

## 2020-05-23 DIAGNOSIS — M6281 Muscle weakness (generalized): Secondary | ICD-10-CM | POA: Diagnosis not present

## 2020-05-23 NOTE — Therapy (Signed)
[place], [place], [address] Phone: [phone]   Fax:  [phone]  Physical Therapy Treatment  Patient Details  Name: Teresa Bright MRN: [id number] Date of Birth: [birth year] Referring Provider (PT): Audelia Hives   Encounter Date: 05/23/2020   PT End of Session - 05/23/20 1409    Visit Number 3    Number of Visits 6    Date for PT Re-Evaluation 07/04/20    Authorization Type cigna managed, no auth required, 60 VL    Progress Note Due on Visit 6    PT Start Time 1403    PT Stop Time 1441    PT Time Calculation (min) 38 min    Activity Tolerance Patient tolerated treatment well    Behavior During Therapy [facility] for tasks assessed/performed           Past Medical History:  Diagnosis Date  . GERD (gastroesophageal reflux disease)   . History of chickenpox   . Migraine   . Pregnant 08/20/2014    Past Surgical History:  Procedure Laterality Date  . cysterectomy    . LAPAROSCOPIC OVARIAN CYSTECTOMY Right 03/03/2016   Procedure: LAPAROSCOPIC RIGHT OVARIAN CYSTECTOMY AND RIGHT OOPHORECTOMY;  Surgeon: Florian Buff, MD;  Location: [facility];  Service: Gynecology;  Laterality: Right;  . OOPHORECTOMY Right   . WISDOM TOOTH EXTRACTION      There were no vitals filed for this visit.   Subjective Assessment - 05/23/20 1408    Subjective Pt stated she feels good today, no reports of pain.  Has been compliant with HEP.    Currently in Pain? No/denies                             [facility] Adult PT Treatment/Exercise - 05/23/20 0001      Posture/Postural Control   Posture/Postural Control Postural limitations    Postural Limitations Forward head;Rounded Shoulders      Exercises   Exercises Shoulder      Shoulder Exercises: Seated   Other Seated Exercises 3D thoracic excursion 5x each      Shoulder Exercises: Standing   Extension 15 reps    Theraband Level (Shoulder Extension) Level 2  (Red)    Extension Limitations 2 sets 15 red then 10 green- HEP    Row 15 reps    Theraband Level (Shoulder Row) Level 2 (Red)    Row Limitations 2 sets 15 red then 10 green- HEP    Retraction 15 reps    Theraband Level (Shoulder Retraction) Level 2 (Red)    Retraction Limitations 2 sets 15 red then 10 green- HEP    Other Standing Exercises L2 RTB pull down 10x     Other Standing Exercises cervical rectraction against wall, UE flexion with cervical recraction and wall arch 10x      Shoulder Exercises: Stretch   Corner Stretch 3 reps;30 seconds    Corner Stretch Limitations door way                    PT Short Term Goals - 05/09/20 1526      PT SHORT TERM GOAL #1   Title Patient will be independent in self management strategies to improve quality of life and functional outcomes.    Time 4    Period Weeks    Status New    Target Date 06/06/20      PT SHORT TERM  GOAL #2   Title Patient will report at least 50% improvement in overall symptoms and/or function to demonstrate improved functional mobility    Time 4    Period Weeks    Status New    Target Date 06/06/20             PT Long Term Goals - 05/09/20 1526      PT LONG TERM GOAL #1   Title Patient will report at least 75% improvement in overall symptoms and/or function to demonstrate improved functional mobility    Time 8    Period Weeks    Status New    Target Date 07/04/20      PT LONG TERM GOAL #2   Title Patient will improve on FOTO score to meet predicted outcomes to demonstrate improved functional mobility.    Time 8    Period Weeks    Status New    Target Date 07/04/20      PT LONG TERM GOAL #3   Time --    Period --    Status --    Target Date --                 Plan - 05/23/20 1431    Clinical Impression Statement Pt progressing well towards session.  Session focus with thoracic mobility and progressed postural strengthening.  Added standing cervical retraction exercises and  added theraband for postural strengthening to HEP.  No reports of pain through session.  Reviewed proper sitting posture with desk and work.    Examination-Activity Limitations Sleep;Stairs;Stand;Lift;Reach Overhead;Carry    Examination-Participation Restrictions Cleaning;Driving;Occupation;Meal Prep    Stability/Clinical Decision Making Stable/Uncomplicated    Clinical Decision Making Low    Rehab Potential Good    PT Frequency --   7-0W/UGQB over 8 week certification period for total of 6 visits   PT Duration 8 weeks    PT Treatment/Interventions ADLs/Self Care Home Management;Moist Heat;Traction;Electrical Stimulation;Cryotherapy;Neuromuscular re-education;Patient/family education;Manual techniques;Dry needling;Joint Manipulations    PT Next Visit Plan Add wall push-up next session wiht cervical retraction.  Continue posture exercises, pec stretch, thoracic mobility, posterior chain strengthening    PT Home Exercise Plan shoulder ER at wall; 05/14/20: cervical retraction and wback; 05/23/20: GTB scapular retraction with ER, row, and extension.  Office posture printout           Patient will benefit from skilled therapeutic intervention in order to improve the following deficits and impairments:  Pain, Decreased strength, Decreased range of motion, Decreased activity tolerance, Postural dysfunction  Visit Diagnosis: Muscle weakness (generalized)  Pain in thoracic spine     Problem List Patient Active Problem List   Diagnosis Date Noted  . Neck pain 04/22/2020  . Symptomatic mammary hypertrophy 01/13/2020  . Chronic bilateral thoracic back pain 01/13/2020  . Yeast infection of the skin 01/13/2020  . BV (bacterial vaginosis) 07/04/2019  . Vaginal discharge 07/04/2019  . Burning with urination 07/04/2019  . Visit for preventive health examination 12/22/2018  . Onychomycosis 12/22/2018  . Class 2 obesity due to excess calories without serious comorbidity with body mass index (BMI)  of 37.0 to 37.9 in adult 12/22/2018  . Cervical high risk HPV (human papillomavirus) test positive 10/15/2016  . 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 Ovarian cyst, right 10/03/2014   Ihor Austin, LPTA/CLT; CBIS [phone]  Aldona Lento 05/23/2020, 2:45 PM  [facility] [address] [place], [place], [address] Phone: [phone]   Fax:  [phone]  Name: Chrishawn Kring MRN: [id number] Date of Birth: [birth year]

## 2020-05-29 ENCOUNTER — Telehealth: Payer: Self-pay

## 2020-05-29 NOTE — Telephone Encounter (Signed)
Call to pt to discuss mammogram/ultrasound results per Dr. Marla Roe; Pt was told by Dr. Jeanmarie Plant with [facility] that she would need to have 2 biopsies on the right breast. She has these procedures scheduled on 06/03/20 at the breast center. On further discussion she informed me that she did not identify these lesions on monthly self exam & she denies any pain.  She was very appreciative that this pre-surgical mammogram was ordered by Dr. Marla Roe.. Pt has been doing PT- that was needed for her breast reduction surgery- & she will complete these PT sessions at the end of Dec. She would like for Dr. Marla Roe to submit breast reduction request to insurance after PT & she would like to schedule that surgery in March 2022- I did inform Frances Furbish our scheduler of this request. She will request for the biopsy results to be sent to our office.

## 2020-05-30 ENCOUNTER — Ambulatory Visit (HOSPITAL_COMMUNITY): Payer: Managed Care, Other (non HMO) | Admitting: Physical Therapy

## 2020-05-30 ENCOUNTER — Encounter (HOSPITAL_COMMUNITY): Payer: Self-pay | Admitting: Physical Therapy

## 2020-05-30 ENCOUNTER — Other Ambulatory Visit: Payer: Self-pay

## 2020-05-30 DIAGNOSIS — M6281 Muscle weakness (generalized): Secondary | ICD-10-CM | POA: Diagnosis not present

## 2020-05-30 DIAGNOSIS — M546 Pain in thoracic spine: Secondary | ICD-10-CM

## 2020-05-30 NOTE — Therapy (Signed)
[place], [place], [address] Phone: [phone]   Fax:  [phone]  Physical Therapy Treatment  Patient Details  Name: Teresa Bright MRN: [id number] Date of Birth: 1986/01/08 Referring Provider (PT): Audelia Hives   Encounter Date: 05/30/2020   PT End of Session - 05/30/20 1531    Visit Number 4    Number of Visits 6    Date for PT Re-Evaluation 07/04/20    Authorization Type cigna managed, no auth required, 60 VL    Authorization - Number of Visits 4    Progress Note Due on Visit 6    PT Start Time 1448    PT Stop Time 1528    PT Time Calculation (min) 40 min    Activity Tolerance Patient tolerated treatment well    Behavior During Therapy [facility] for tasks assessed/performed           Past Medical History:  Diagnosis Date  . GERD (gastroesophageal reflux disease)   . History of chickenpox   . Migraine   . Pregnant 08/20/2014    Past Surgical History:  Procedure Laterality Date  . cysterectomy    . LAPAROSCOPIC OVARIAN CYSTECTOMY Right 03/03/2016   Procedure: LAPAROSCOPIC RIGHT OVARIAN CYSTECTOMY AND RIGHT OOPHORECTOMY;  Surgeon: Florian Buff, MD;  Location: [facility];  Service: Gynecology;  Laterality: Right;  . OOPHORECTOMY Right   . WISDOM TOOTH EXTRACTION      There were no vitals filed for this visit.   Subjective Assessment - 05/30/20 1451    Subjective Pt states that her back is doing well she has a slight pain in her trap area B.    Currently in Pain? Yes    Pain Score 3     Pain Location Neck    Pain Orientation Right;Left    Pain Descriptors / Indicators Aching    Pain Type Chronic pain    Pain Onset More than a month ago    Pain Frequency Constant    Aggravating Factors  not sure    Pain Relieving Factors not sure                             [facility] Adult PT Treatment/Exercise - 05/30/20 0001      Exercises   Exercises Shoulder      Shoulder Exercises:  Seated   Retraction Both;10 reps    Other Seated Exercises 3D thoracic excursion 5x each    Other Seated Exercises UBE x 2 minutes backward; cervical excursion x 3      Shoulder Exercises: Prone   Retraction Both;10 reps    Retraction Weight (lbs) 2    Extension Both;10 reps;Strengthening    Extension Weight (lbs) 2  Axial extension x 10      Shoulder Exercises: Standing   Extension 10 reps;Strengthening;Theraband    Theraband Level (Shoulder Extension) Level 3 (Green)    Row 10 reps;Strengthening;Theraband      Manual Therapy   Manual Therapy Soft tissue mobilization    Manual therapy comments to decrease pain and spasm to mid trap    Soft tissue mobilization efflurage; petrissage and pressure                    PT Short Term Goals - 05/09/20 1526      PT SHORT TERM GOAL #1   Title Patient will be independent in self management strategies to improve quality  of life and functional outcomes.    Time 4    Period Weeks    Status New    Target Date 06/06/20      PT SHORT TERM GOAL #2   Title Patient will report at least 50% improvement in overall symptoms and/or function to demonstrate improved functional mobility    Time 4    Period Weeks    Status New    Target Date 06/06/20             PT Long Term Goals - 05/09/20 1526      PT LONG TERM GOAL #1   Title Patient will report at least 75% improvement in overall symptoms and/or function to demonstrate improved functional mobility    Time 8    Period Weeks    Status New    Target Date 07/04/20      PT LONG TERM GOAL #2   Title Patient will improve on FOTO score to meet predicted outcomes to demonstrate improved functional mobility.    Time 8    Period Weeks    Status New    Target Date 07/04/20      PT LONG TERM GOAL #3   Time --    Period --    Status --    Target Date --                 Plan - 05/30/20 1532    Clinical Impression Statement Pt has noted palpatable mm spasm in Rt upper  trap therefore manual started to decrease pain.  Progressed pt to prone strengthening exercises with good technique noted after verbal cuing.    Examination-Activity Limitations Sleep;Stairs;Stand;Lift;Reach Overhead;Carry    Examination-Participation Restrictions Cleaning;Driving;Occupation;Meal Prep    Stability/Clinical Decision Making Stable/Uncomplicated    Rehab Potential Good    PT Frequency --   7-9K/WIOX over 8 week certification period for total of 6 visits   PT Duration 8 weeks    PT Treatment/Interventions ADLs/Self Care Home Management;Moist Heat;Traction;Electrical Stimulation;Cryotherapy;Neuromuscular re-education;Patient/family education;Manual techniques;Dry needling;Joint Manipulations    PT Next Visit Plan Add wall push-up next session wiht cervical retraction.  Continue posture exercises, pec stretch, thoracic mobility, posterior chain strengthening    PT Home Exercise Plan shoulder ER at wall; 05/14/20: cervical retraction and wback; 05/23/20: GTB scapular retraction with ER, row, and extension.  Office posture printout           Patient will benefit from skilled therapeutic intervention in order to improve the following deficits and impairments:  Pain,Decreased strength,Decreased range of motion,Decreased activity tolerance,Postural dysfunction  Visit Diagnosis: Muscle weakness (generalized)  Pain in thoracic spine     Problem List Patient Active Problem List   Diagnosis Date Noted  . Neck pain 04/22/2020  . Symptomatic mammary hypertrophy 01/13/2020  . Chronic bilateral thoracic back pain 01/13/2020  . Yeast infection of the skin 01/13/2020  . BV (bacterial vaginosis) 07/04/2019  . Vaginal discharge 07/04/2019  . Burning with urination 07/04/2019  . Visit for preventive health examination 12/22/2018  . Onychomycosis 12/22/2018  . Class 2 obesity due to excess calories without serious comorbidity with body mass index (BMI) of 37.0 to 37.9 in adult 12/22/2018   . Cervical high risk HPV (human papillomavirus) test positive 10/15/2016  . 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 Ovarian cyst, right 10/03/2014   Rayetta Humphrey, PT CLT [phone] 05/30/2020, 3:34 PM  [facility] [address] [place], [place], [address] Phone: [phone]  Fax:  [phone]  Name: Teresa Bright MRN: [id number] Date of Birth: 04-27-1986

## 2020-06-03 ENCOUNTER — Ambulatory Visit
Admission: RE | Admit: 2020-06-03 | Discharge: 2020-06-03 | Disposition: A | Discharge status: Home / Self Care | Payer: Managed Care, Other (non HMO) | Source: Ambulatory Visit | Attending: Plastic Surgery | Admitting: Plastic Surgery

## 2020-06-03 ENCOUNTER — Other Ambulatory Visit: Payer: Self-pay

## 2020-06-03 DIAGNOSIS — N6310 Unspecified lump in the right breast, unspecified quadrant: Secondary | ICD-10-CM

## 2020-06-06 ENCOUNTER — Other Ambulatory Visit: Payer: Self-pay | Admitting: Physician Assistant

## 2020-06-06 ENCOUNTER — Other Ambulatory Visit: Payer: Self-pay

## 2020-06-06 ENCOUNTER — Ambulatory Visit (HOSPITAL_COMMUNITY): Payer: Managed Care, Other (non HMO) | Admitting: Physical Therapy

## 2020-06-06 DIAGNOSIS — M6281 Muscle weakness (generalized): Secondary | ICD-10-CM

## 2020-06-06 DIAGNOSIS — M546 Pain in thoracic spine: Secondary | ICD-10-CM

## 2020-06-06 MED ORDER — HYDROCORTISONE ACETATE 25 MG RE SUPP: 25.0000 mg | Freq: Two times a day (BID) | RECTAL | 0 refills | Status: DC

## 2020-06-06 NOTE — Patient Instructions (Signed)
Access Code: [email or web address] URL: [email or web address] Date: 06/06/2020 Prepared by: Sherlyn Lees  Exercises Supine Shoulder Horizontal Abduction with Resistance - 1 x daily - 7 x weekly - 3 sets - 10 reps Corner Pec Major Stretch - 1 x daily - 7 x weekly - 3 sets - 3 reps - 30 hold

## 2020-06-06 NOTE — Therapy (Signed)
[place], [place], [address] Phone: [phone]   Fax:  [phone]  Physical Therapy Treatment  Patient Details  Name: Teresa Bright MRN: [id number] Date of Birth: 1986/05/15 Referring Provider (PT): Audelia Hives   Encounter Date: 06/06/2020   PT End of Session - 06/06/20 1446    Visit Number 5    Number of Visits 6    Date for PT Re-Evaluation 07/04/20    Authorization Type cign[employer] managed, no auth required, 60 VL    Authorization - Number of Visits 4    Progress Note Due on Visit 6    PT Start Time 4431    PT Stop Time 5400   pt requests decreased treatment time due to family obligations   PT Time Calculation (min) 31 min    Activity Tolerance Patient tolerated treatment well    Behavior During Therapy [facility] for tasks assessed/performed           Past Medical History:  Diagnosis Date  . GERD (gastroesophageal reflux disease)   . History of chickenpox   . Migraine   . Pregnant 08/20/2014    Past Surgical History:  Procedure Laterality Date  . cysterectomy    . LAPAROSCOPIC OVARIAN CYSTECTOMY Right 03/03/2016   Procedure: LAPAROSCOPIC RIGHT OVARIAN CYSTECTOMY AND RIGHT OOPHORECTOMY;  Surgeon: Florian Buff, MD;  Location: [facility];  Service: Gynecology;  Laterality: Right;  . OOPHORECTOMY Right   . WISDOM TOOTH EXTRACTION      There were no vitals filed for this visit.   Subjective Assessment - 06/06/20 1449    Subjective Patient reports she is feeling better and that she recently had a breast biopsy and was feeling some residual soreness from the procedure.  She reports her upper thoracic spine is feeling less sore and she is progressing with her HEP for upper back strengthening    Currently in Pain? No/denies    Pain Score 0-No pain    Pain Onset More than a month ago              [facility] PT Assessment - 06/06/20 0001      Assessment   Medical Diagnosis thoracic pain                          [facility] Adult PT Treatment/Exercise - 06/06/20 0001      Shoulder Exercises: Standing   Extension Strengthening;10 reps   3 sets   Theraband Level (Shoulder Extension) Level 4 (Blue)    Row Strengthening;10 reps   3 sets   Theraband Level (Shoulder Row) Level 4 (Blue)    Theraband Level (Shoulder Retraction) Level 3 (Green)    Retraction Weight (lbs) 2x10    Other Standing Exercises scapular adduction with shoulder internal/external rotation 2x10   green t-band     Shoulder Exercises: Stretch   Corner Stretch 3 reps;30 seconds   corner stretch                 PT Education - 06/06/20 1509    Education Details Patient educated in HEP additions for scapular strengthening and review of previous movements with good return demonstration    Person(s) Educated Patient    Methods Explanation    Comprehension Verbalized understanding;Returned demonstration            PT Short Term Goals - 06/06/20 1522      PT SHORT TERM GOAL #1   Title  Patient will be independent in self management strategies to improve quality of life and functional outcomes.    Time 4    Period Weeks    Status On-going    Target Date 06/06/20      PT SHORT TERM GOAL #2   Title Patient will report at least 50% improvement in overall symptoms and/or function to demonstrate improved functional mobility    Time 4    Period Weeks    Status On-going    Target Date 06/06/20             PT Long Term Goals - 05/09/20 1526      PT LONG TERM GOAL #1   Title Patient will report at least 75% improvement in overall symptoms and/or function to demonstrate improved functional mobility    Time 8    Period Weeks    Status New    Target Date 07/04/20      PT LONG TERM GOAL #2   Title Patient will improve on FOTO score to meet predicted outcomes to demonstrate improved functional mobility.    Time 8    Period Weeks    Status New    Target Date 07/04/20      PT LONG TERM GOAL  #3   Time --    Period --    Status --    Target Date --                 Plan - 06/06/20 1506    Clinical Impression Statement Patient reports improved symptoms with decrease in sharp pain experienced but reports continued feeling of soreness.  Patient tolerating strengthening exercises without adverse effects and reports she will hopefully have a breast reduction surgery in the spring.  Continue with POC details to progress and increase resistance exercises for scapular strengthening to improve soft tissue efficiency. Patient tolerated increased resistance without issue. No tenderness to palpation noted today.    Examination-Activity Limitations Sleep;Stairs;Stand;Lift;Reach Overhead;Carry    Examination-Participation Restrictions Cleaning;Driving;Occupation;Meal Prep    Stability/Clinical Decision Making Stable/Uncomplicated    Rehab Potential Good    PT Frequency --   2-3J/SEGB over 8 week certification period for total of 6 visits   PT Duration 8 weeks    PT Treatment/Interventions ADLs/Self Care Home Management;Moist Heat;Traction;Electrical Stimulation;Cryotherapy;Neuromuscular re-education;Patient/family education;Manual techniques;Dry needling;Joint Manipulations    PT Next Visit Plan Add wall push-up next session wiht cervical retraction.  Continue posture exercises, pec stretch, thoracic mobility, posterior chain strengthening. Review of HEP to prepare for D/C    PT Home Exercise Plan shoulder ER at wall; 05/14/20: cervical retraction and wback; 05/23/20: GTB scapular retraction with ER, row, and extension.  Office posture printout. 12/17 horizontal scapular addition with shoulder internal/external rotation with green t-band           Patient will benefit from skilled therapeutic intervention in order to improve the following deficits and impairments:  Pain,Decreased strength,Decreased range of motion,Decreased activity tolerance,Postural dysfunction  Visit  Diagnosis: Muscle weakness (generalized)  Pain in thoracic spine     Problem List Patient Active Problem List   Diagnosis Date Noted  . Neck pain 04/22/2020  . Symptomatic mammary hypertrophy 01/13/2020  . Chronic bilateral thoracic back pain 01/13/2020  . Yeast infection of the skin 01/13/2020  . BV (bacterial vaginosis) 07/04/2019  . Vaginal discharge 07/04/2019  . Burning with urination 07/04/2019  . Visit for preventive health examination 12/22/2018  . Onychomycosis 12/22/2018  . Class 2 obesity due to excess calories  without serious comorbidity with body mass index (BMI) of 37.0 to 37.9 in adult 12/22/2018  . Cervical high risk HPV (human papillomavirus) test positive 10/15/2016  . 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 Ovarian cyst, right 10/03/2014    3:27 PM, 06/06/20 M. Sherlyn Lees, PT, DPT Physical Therapist- [place] Office Number: [phone]  [place] [address] [place], [place], [address] Phone: [phone]   Fax:  [phone]  Name: Ashten Sarnowski MRN: [id number] Date of Birth: [birth year]

## 2020-06-11 ENCOUNTER — Encounter (HOSPITAL_COMMUNITY): Payer: Self-pay | Admitting: Physical Therapy

## 2020-06-11 ENCOUNTER — Other Ambulatory Visit: Payer: Self-pay

## 2020-06-11 ENCOUNTER — Ambulatory Visit (HOSPITAL_COMMUNITY): Payer: Managed Care, Other (non HMO) | Admitting: Physical Therapy

## 2020-06-11 DIAGNOSIS — M6281 Muscle weakness (generalized): Secondary | ICD-10-CM | POA: Diagnosis not present

## 2020-06-11 DIAGNOSIS — M546 Pain in thoracic spine: Secondary | ICD-10-CM

## 2020-06-11 NOTE — Therapy (Signed)
[facility] [address] [place], [place], [address] Phone: [phone]   Fax:  [phone]  Physical Therapy Treatment  Patient Details  Name: Teresa Bright MRN: [id number] Date of Birth: [birth year] Referring Provider (PT): Audelia Hives  PHYSICAL THERAPY DISCHARGE SUMMARY  Visits from Start of Care: 6  Current functional level related to goals / functional outcomes: See below   Remaining deficits: None at this time   Education / Equipment: Pt provided with comprehensive HEP and requisite materials Plan: Patient agrees to discharge.  Patient goals were partially met. Patient is being discharged due to being pleased with the current functional level.  ?????       Encounter Date: 06/11/2020   PT End of Session - 06/11/20 1621    Visit Number 6    Number of Visits 6    Date for PT Re-Evaluation 07/04/20    Authorization Type cigna managed, no auth required, 60 VL    Authorization - Number of Visits 6    Progress Note Due on Visit 6    PT Start Time 5170    PT Stop Time 1640    PT Time Calculation (min) 25 min    Activity Tolerance Patient tolerated treatment well    Behavior During Therapy WFL for tasks assessed/performed           Past Medical History:  Diagnosis Date  . GERD (gastroesophageal reflux disease)   . History of chickenpox   . Migraine   . Pregnant 08/20/2014    Past Surgical History:  Procedure Laterality Date  . cysterectomy    . LAPAROSCOPIC OVARIAN CYSTECTOMY Right 03/03/2016   Procedure: LAPAROSCOPIC RIGHT OVARIAN CYSTECTOMY AND RIGHT OOPHORECTOMY;  Surgeon: Florian Buff, MD;  Location: [facility];  Service: Gynecology;  Laterality: Right;  . OOPHORECTOMY Right   . WISDOM TOOTH EXTRACTION      There were no vitals filed for this visit.   Subjective Assessment - 06/11/20 1618    Subjective Patient reports she is feeling fine and no lingering ill effects from strengthening program.   Patient reports she is compliant with HEP that emphasizes scapular strengthening. Patient reports she feels overall stronger but upper back discomfort remains    Currently in Pain? No/denies   pt reports more discomfort rather than sharp pain   Pain Score 0-No pain    Pain Descriptors / Indicators Aching    Pain Onset More than a month ago              [facility] PT Assessment - 06/11/20 0001      Assessment   Medical Diagnosis thoracic pain      Observation/Other Assessments   Focus on Therapeutic Outcomes (FOTO)  50% function                         [facility] Adult PT Treatment/Exercise - 06/11/20 0001      Shoulder Exercises: Standing   Extension Strengthening;10 reps    Theraband Level (Shoulder Extension) Level 4 (Blue)    Row Strengthening;10 reps    Theraband Level (Shoulder Row) Level 4 (Blue)    Theraband Level (Shoulder Retraction) Level 4 (Blue)    Retraction Weight (lbs) 2x10    Other Standing Exercises scapular adduction with shoulder internal/external rotation 2x10      Shoulder Exercises: Stretch   Corner Stretch 3 reps;30 seconds    Corner Stretch Limitations door way      Shoulder  Exercises: Power [employer] 25 reps    Row Limitations 5 plates                  PT Education - 06/11/20 1620    Education Details comprehensive HEP review to prepare for D/C to self-managed HEP            PT Short Term Goals - 06/11/20 1625      PT SHORT TERM GOAL #1   Title Patient will be independent in self management strategies to improve quality of life and functional outcomes.    Time 4    Period Weeks    Status Achieved    Target Date 06/06/20      PT SHORT TERM GOAL #2   Title Patient will report at least 50% improvement in overall symptoms and/or function to demonstrate improved functional mobility    Baseline Patient reports 40% improvement    Time 4    Period Weeks    Status Not Met    Target Date 06/06/20             PT Long  Term Goals - 06/11/20 1640      PT LONG TERM GOAL #1   Title Patient will report at least 75% improvement in overall symptoms and/or function to demonstrate improved functional mobility    Baseline reports 40% improvement    Time 8    Period Weeks    Status Not Met      PT LONG TERM GOAL #2   Title Patient will improve on FOTO score to meet predicted outcomes to demonstrate improved functional mobility.    Baseline 47% at initial evaluation, 50% at D/C    Time 8    Period Weeks    Status Not Met                 Plan - 06/11/20 1623    Clinical Impression Statement Patient demonstrates good compliance with HEP activities and demonstrates independence in execution. Patient is prepared for D/C to HEP for self-management and does not have any further complaints at this time    Examination-Activity Limitations Sleep;Stairs;Stand;Lift;Reach Overhead;Carry    Examination-Participation Restrictions Cleaning;Driving;Occupation;Meal Prep    Stability/Clinical Decision Making Stable/Uncomplicated    Rehab Potential Good    PT Frequency --   3-2R/JJOA over 8 week certification period for total of 6 visits   PT Duration 8 weeks    PT Treatment/Interventions ADLs/Self Care Home Management;Moist Heat;Traction;Electrical Stimulation;Cryotherapy;Neuromuscular re-education;Patient/family education;Manual techniques;Dry needling;Joint Manipulations    PT Next Visit Plan D/C to HEP    PT Home Exercise Plan shoulder ER at wall; 05/14/20: cervical retraction and wback; 05/23/20: GTB scapular retraction with ER, row, and extension.  Office posture printout. 12/17 horizontal scapular addition with shoulder internal/external rotation with green t-band. Pt provided with blue T-band for increased resistance for HEP activities           Patient will benefit from skilled therapeutic intervention in order to improve the following deficits and impairments:  Pain,Decreased strength,Decreased range of  motion,Decreased activity tolerance,Postural dysfunction  Visit Diagnosis: Muscle weakness (generalized)  Pain in thoracic spine     Problem List Patient Active Problem List   Diagnosis Date Noted  . Neck pain 04/22/2020  . Symptomatic mammary hypertrophy 01/13/2020  . Chronic bilateral thoracic back pain 01/13/2020  . Yeast infection of the skin 01/13/2020  . BV (bacterial vaginosis) 07/04/2019  . Vaginal discharge 07/04/2019  . Burning with urination 07/04/2019  .  Visit for preventive health examination 12/22/2018  . Onychomycosis 12/22/2018  . Class 2 obesity due to excess calories without serious comorbidity with body mass index (BMI) of 37.0 to 37.9 in adult 12/22/2018  . Cervical high risk HPV (human papillomavirus) test positive 10/15/2016  . 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 Ovarian cyst, right 10/03/2014    4:45 PM, 06/11/20 M. Sherlyn Lees, PT, DPT Physical Therapist- [place] Office Number: [phone]  [facility] [place] [address] [place], [place], [address] Phone: [phone]   Fax:  [phone]  Name: Aalaya Yadao MRN: [id number] Date of Birth: [birth year]

## 2020-07-09 ENCOUNTER — Ambulatory Visit: Payer: Managed Care, Other (non HMO) | Admitting: Surgical

## 2020-07-23 ENCOUNTER — Ambulatory Visit: Payer: Managed Care, Other (non HMO) | Admitting: Surgical

## 2020-08-07 ENCOUNTER — Other Ambulatory Visit: Payer: Self-pay

## 2020-08-07 ENCOUNTER — Encounter: Payer: Self-pay | Admitting: Surgical

## 2020-08-07 ENCOUNTER — Ambulatory Visit: Payer: Managed Care, Other (non HMO) | Admitting: Surgical

## 2020-08-07 VITALS — BP 144/94 | HR 73 | Ht 64.0 in | Wt 230.6 lb

## 2020-08-07 DIAGNOSIS — M546 Pain in thoracic spine: Secondary | ICD-10-CM

## 2020-08-07 DIAGNOSIS — N62 Hypertrophy of breast: Secondary | ICD-10-CM

## 2020-08-07 DIAGNOSIS — M542 Cervicalgia: Secondary | ICD-10-CM

## 2020-08-07 DIAGNOSIS — G8929 Other chronic pain: Secondary | ICD-10-CM | POA: Diagnosis not present

## 2020-08-07 NOTE — Progress Notes (Signed)
   Referring Provider Brunetta Jeans, PA-C [address] [place],  [place] [address]   CC:  Chief Complaint  Patient presents with  . Follow-up      Teresa Bright is an 35 y.o. female.  HPI: Patient is a [age]Y female with a history of mammary hyperplasia for several years.  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  She initially saw Dr. Marla Roe on 04/22/2020 for evaluation of her mammary hyperplasia. The STN on the right is 42 cm and the left is 45 cm.  The IMF distance is 20 cm.  She is 5 feet 4 inches tall and weighs 230 lbs, her preoperative bra size is a 38-40 H cup.  She would like to be a C cup. The estimated excess breast tissue to be removed at the time of surgery is 1000 g in the left and 1000 g on the right.  She is not a smoker and not a diabetic.  Patient has been going to physical therapy, she reports that overall it was helpful, however she reports that she is still having daily upper back/shoulder/neck pain.  She reports that she still notices her shoulder straps are causing grooves and pain.  She is still interested in surgical intervention for relief of her upper back pain and reduction of her breasts.  She reports that she is still having rashes beneath the folds of her breasts.  Review of Systems General: No f/c/n/v  Physical Exam Vitals with BMI 08/07/2020 05/14/2020 04/22/2020  Height 5\' 4"  5\' 4"  5\' 4"   Weight 230 lbs 10 oz 224 lbs 220 lbs 10 oz  BMI 39.56 72.62 03.55  Systolic 974 163 845  Diastolic 94 84 82  Pulse 73 78 87    General:  No acute distress,  Alert and oriented, Non-Toxic, Normal speech and affect   Assessment/Plan  Patient is a 35 year old female here for follow-up/reconsultation of her mammary hyperplasia.  She has completed physical therapy, reports that she had some improvement, mostly with learning new exercises and posture, however still is having  daily upper back/neck/shoulder pain.  She is still interested in surgical intervention for bilateral breast reduction.  We will resubmit this to her insurance company after completion of physical therapy.  She is a good candidate for bilateral breast reduction for relief of her upper back/neck pain.  All of her questions were answered to her content. Recommend she call us with any questions or concerns   Charlies Constable 08/07/2020, 11:03 AM

## 2020-09-19 ENCOUNTER — Other Ambulatory Visit: Payer: Self-pay

## 2020-09-19 ENCOUNTER — Encounter: Payer: Self-pay | Admitting: Surgical

## 2020-09-19 ENCOUNTER — Ambulatory Visit: Payer: Managed Care, Other (non HMO) | Admitting: Surgical

## 2020-09-19 VITALS — BP 146/86 | HR 85 | Ht 64.0 in | Wt 232.0 lb

## 2020-09-19 DIAGNOSIS — M546 Pain in thoracic spine: Secondary | ICD-10-CM

## 2020-09-19 DIAGNOSIS — N62 Hypertrophy of breast: Secondary | ICD-10-CM

## 2020-09-19 DIAGNOSIS — M542 Cervicalgia: Secondary | ICD-10-CM

## 2020-09-19 DIAGNOSIS — G8929 Other chronic pain: Secondary | ICD-10-CM

## 2020-09-19 HISTORY — PX: REDUCTION MAMMAPLASTY: SUR839

## 2020-09-19 MED ORDER — HYDROCODONE-ACETAMINOPHEN 5-325 MG PO TABS: 1.0000 | ORAL_TABLET | Freq: Four times a day (QID) | ORAL | 0 refills | Status: AC | PRN

## 2020-09-19 MED ORDER — CEPHALEXIN 500 MG PO CAPS: 500.0000 mg | ORAL_CAPSULE | Freq: Four times a day (QID) | ORAL | 0 refills | Status: AC

## 2020-09-19 MED ORDER — ONDANSETRON HCL 4 MG PO TABS: 4.0000 mg | ORAL_TABLET | Freq: Three times a day (TID) | ORAL | 0 refills | Status: DC | PRN

## 2020-09-19 NOTE — Progress Notes (Signed)
Patient ID: Teresa Bright, female    DOB: [birth year], 35 y.o.   MRN: [id number]  Chief Complaint  Patient presents with  . Pre-op Exam      ICD-10-CM   1. Chronic bilateral thoracic back pain  M54.6    G89.29   2. Neck pain  M54.2   3. Large breasts  N62   4. Macromastia  N62      History of Present Illness: Teresa Bright is a 35 y.o.  female  with a history of macromastia.  She presents for preoperative evaluation for upcoming procedure, Bilateral Breast Reduction with possible liposuction, scheduled for 10/09/2020 with Dr.  Marla Roe  The patient has not had problems with anesthesia. No history of DVT/PE.  No family history of DVT/PE.  No family or personal history of bleeding or clotting disorders.  Patient is not currently taking any blood thinners.  No history of CVA/MI.   Summary of Previous Visit: STN on the right is 42 cm and the left is 45 cm.  IMF distance is 20 cm.  Her preop bra size is 38-40 h cup.  She would like to be a C cup.  Estimated excess breast tissue to be removed at time of surgery: 1000 g on the left and 1000 g on the right.  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  She reports she stopped her OCPs approximately 3 weeks ago.  She reports she is feeling well, no recent fevers, chills, nausea,, chest pain, shortness of breath   Past Medical History: Allergies: No Known Allergies  Current Medications:  Current Outpatient Medications:  .  Cholecalciferol (VITAMIN D3 PO), Take 1,000 mg by mouth daily. , Disp: , Rfl:  .  LO LOESTRIN FE 1 MG-10 MCG / 10 MCG tablet, Take 1 tablet by mouth once daily (Patient not taking: Reported on 09/19/2020), Disp: 84 tablet, Rfl: 2  Past Medical Problems: Past Medical History:  Diagnosis Date  . GERD (gastroesophageal reflux disease)   . History of chickenpox   . Migraine   . Pregnant 08/20/2014    Past Surgical History: Past Surgical History:   Procedure Laterality Date  . cysterectomy    . LAPAROSCOPIC OVARIAN CYSTECTOMY Right 03/03/2016   Procedure: LAPAROSCOPIC RIGHT OVARIAN CYSTECTOMY AND RIGHT OOPHORECTOMY;  Surgeon: Florian Buff, MD;  Location: [facility];  Service: Gynecology;  Laterality: Right;  . OOPHORECTOMY Right   . WISDOM TOOTH EXTRACTION      Social History: Social History   Socioeconomic History  . Marital status: Married    Spouse name: Not on file  . Number of children: 1  . Years of education: Not on file  . Highest education level: Not on file  Occupational History  . Not on file  Tobacco Use  . Smoking status: Never Smoker  . Smokeless tobacco: Never Used  Vaping Use  . Vaping Use: Never used  Substance and Sexual Activity  . Alcohol use: Not Currently    Alcohol/week: 0.0 standard drinks    Comment: occassionally   . Drug use: No  . Sexual activity: Yes    Partners: Male    Birth control/protection: Pill  Other Topics Concern  . Not on file  Social History Narrative  . Not on file   Social Determinants of Health   Financial Resource Strain: Not on file  Food Insecurity: Not on file  Transportation Needs: Not on file  Physical  Activity: Not on file  Stress: Not on file  Social Connections: Not on file  Intimate Partner Violence: Not on file    Family History: Family History  Problem Relation Age of Onset  . Hypertension Mother   . Hyperlipidemia Mother   . Anxiety disorder Mother   . Healthy Father   . Cancer Maternal Grandmother        skin ; also intestinal malignancy in 53s  . Diabetes Maternal Grandmother   . Dementia Maternal Grandmother   . Stroke Maternal Grandfather   . Aneurysm Maternal Grandfather        brain  . Cancer Paternal Grandmother        bone and breast  . Breast cancer Paternal Grandmother   . Mental illness Maternal Uncle   . Stroke Maternal Uncle     Review of Systems: Review of Systems  Constitutional: Negative.   Cardiovascular: Negative.    Gastrointestinal: Negative.   Neurological: Negative.     Physical Exam: Vital Signs BP (!) 146/86 (BP Location: Right Arm, Patient Position: Sitting, Cuff Size: Large)   Pulse 85   Ht 5\' 4"  (1.626 m)   Wt 232 lb (105.2 kg)   SpO2 98%   BMI 39.82 kg/m   Physical Exam Constitutional:      General: Not in acute distress.    Appearance: Normal appearance. Not ill-appearing.  HENT:     Head: Normocephalic and atraumatic.  Eyes:     Pupils: Pupils are equal, round Neck:     Musculoskeletal: Normal range of motion.  Cardiovascular:     Rate and Rhythm: Normal rate    Pulses: Normal pulses.  Pulmonary:     Effort: Pulmonary effort is normal. No respiratory distress.  Abdominal:     General: Abdomen is flat. There is no distension.  Musculoskeletal: Normal range of motion.  Skin:    General: Skin is warm and dry.     Findings: No erythema or rash.  Neurological:     General: No focal deficit present.     Mental Status: Alert and oriented to person, place, and time. Mental status is at baseline.     Motor: No weakness.  Psychiatric:        Mood and Affect: Mood normal.        Behavior: Behavior normal.    Assessment/Plan: The patient is scheduled for bilateral breast reduction with Dr. Marla Roe.  Risks, benefits, and alternatives of procedure discussed, questions answered and consent obtained.    Smoking Status: Non-smoker; Counseling Given?  N/A Last Mammogram: 05/21/2020, subsequently had ultrasound of right breast with clip placement and pathology results showing fibroadenomas.  Caprini Score: 4, moderate; Risk Factors include: Recently stopped OCPs, BMI greater than 25, and length of planned surgery. Recommendation for mechanical and pharmacological prophylaxis while hospitalized. Encourage early ambulation.   Pictures obtained: 04/22/2020  Post-op Rx sent to pharmacy: Norco, Zofran, Keflex  Patient was provided with the breast reduction and General Surgical Risk  consent document and Pain Medication Agreement prior to their appointment.  They had adequate time to read through the risk consent documents and Pain Medication Agreement. We also discussed them in person together during this preop appointment. All of their questions were answered to their satisfaction.  Recommended calling if they have any further questions.  Risk consent form and Pain Medication Agreement to be scanned into patient's chart.  The risk that can be encountered with breast reduction were discussed and include the following but not  limited to these:  Breast asymmetry, fluid accumulation, firmness of the breast, inability to breast feed, loss of nipple or areola, skin loss, decrease or no nipple sensation, fat necrosis of the breast tissue, bleeding, infection, healing delay.  There are risks of anesthesia, changes to skin sensation and injury to nerves or blood vessels.  The muscle can be temporarily or permanently injured.  You may have an allergic reaction to tape, suture, glue, blood products which can result in skin discoloration, swelling, pain, skin lesions, poor healing.  Any of these can lead to the need for revisonal surgery or stage procedures.  A reduction has potential to interfere with diagnostic procedures.  Nipple or breast piercing can increase risks of infection.  This procedure is best done when the breast is fully developed.  Changes in the breast will continue to occur over time.  Pregnancy can alter the outcomes of previous breast reduction surgery, weight gain and weigh loss can also effect the long term appearance.   The risks that can be encountered with and after liposuction were discussed and include the following but no limited to these:  Asymmetry, fluid accumulation, firmness of the area, fat necrosis with death of fat tissue, bleeding, infection, delayed healing, anesthesia risks, skin sensation changes, injury to structures including nerves, blood vessels, and  muscles which may be temporary or permanent, allergies to tape, suture materials and glues, blood products, topical preparations or injected agents, skin and contour irregularities, skin discoloration and swelling, deep vein thrombosis, cardiac and pulmonary complications, pain, which may persist, persistent pain, recurrence of the lesion, poor healing of the incision, possible need for revisional surgery or staged procedures. Thiere can also be persistent swelling, poor wound healing, rippling or loose skin, worsening of cellulite, swelling, and thermal burn or heat injury from ultrasound with the ultrasound-assisted lipoplasty technique. Any change in weight fluctuations can alter the outcome.  We discussed the likelihood of amputation/free nipple graft technique due to the length of her STN.  We discussed the risks associated with free nipple graft breast reductions, including but not limited to failure of the graft, partial loss of the graft, loss of sensation of bilateral nipple areola, complete loss of the nipple areola graft, loss of her nipple, inability to breast-feed, postoperative wounds, ongoing wound care.  We also discussed the risks associated with the pedicle technique.  We discussed that with the pedicle technique she could develop nipple areolar necrosis which would result in loss of the nipple, this would also result in ongoing wound care and possible changes in the shape of her breast.  We are in agreement to move forward with the planned procedure and she is aware that amputation technique/free nipple graft is the most likely option for her.  She is understanding of this.     Electronically signed by: Teresa Rhine Annistyn Depass, PA-C 09/19/2020 1:09 PM

## 2020-09-19 NOTE — H&P (View-Only) (Signed)
Patient ID: Teresa Bright, female    DOB: 12-28-1985, 35 y.o.   MRN: [id number]  Chief Complaint  Patient presents with  . Pre-op Exam      ICD-10-CM   1. Chronic bilateral thoracic back pain  M54.6    G89.29   2. Neck pain  M54.2   3. Large breasts  N62   4. Macromastia  N62      History of Present Illness: Teresa Bright is a 35 y.o.  female  with a history of macromastia.  She presents for preoperative evaluation for upcoming procedure, Bilateral Breast Reduction with possible liposuction, scheduled for 10/09/2020 with Dr.  Marla Roe  The patient has not had problems with anesthesia. No history of DVT/PE.  No family history of DVT/PE.  No family or personal history of bleeding or clotting disorders.  Patient is not currently taking any blood thinners.  No history of CVA/MI.   Summary of Previous Visit: STN on the right is 42 cm and the left is 45 cm.  IMF distance is 20 cm.  Her preop bra size is 38-40 h cup.  She would like to be a C cup.  Estimated excess breast tissue to be removed at time of surgery: 1000 g on the left and 1000 g on the right.  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  She reports she stopped her OCPs approximately 3 weeks ago.  She reports she is feeling well, no recent fevers, chills, nausea,, chest pain, shortness of breath   Past Medical History: Allergies: No Known Allergies  Current Medications:  Current Outpatient Medications:  .  Cholecalciferol (VITAMIN D3 PO), Take 1,000 mg by mouth daily. , Disp: , Rfl:  .  LO LOESTRIN FE 1 MG-10 MCG / 10 MCG tablet, Take 1 tablet by mouth once daily (Patient not taking: Reported on 09/19/2020), Disp: 84 tablet, Rfl: 2  Past Medical Problems: Past Medical History:  Diagnosis Date  . GERD (gastroesophageal reflux disease)   . History of chickenpox   . Migraine   . Pregnant 08/20/2014    Past Surgical History: Past Surgical History:   Procedure Laterality Date  . cysterectomy    . LAPAROSCOPIC OVARIAN CYSTECTOMY Right 03/03/2016   Procedure: LAPAROSCOPIC RIGHT OVARIAN CYSTECTOMY AND RIGHT OOPHORECTOMY;  Surgeon: Florian Buff, MD;  Location: [facility];  Service: Gynecology;  Laterality: Right;  . OOPHORECTOMY Right   . WISDOM TOOTH EXTRACTION      Social History: Social History   Socioeconomic History  . Marital status: Married    Spouse name: Not on file  . Number of children: 1  . Years of education: Not on file  . Highest education level: Not on file  Occupational History  . Not on file  Tobacco Use  . Smoking status: Never Smoker  . Smokeless tobacco: Never Used  Vaping Use  . Vaping Use: Never used  Substance and Sexual Activity  . Alcohol use: Not Currently    Alcohol/week: 0.0 standard drinks    Comment: occassionally   . Drug use: No  . Sexual activity: Yes    Partners: Male    Birth control/protection: Pill  Other Topics Concern  . Not on file  Social History Narrative  . Not on file   Social Determinants of Health   Financial Resource Strain: Not on file  Food Insecurity: Not on file  Transportation Needs: Not on file  Physical  Activity: Not on file  Stress: Not on file  Social Connections: Not on file  Intimate Partner Violence: Not on file    Family History: Family History  Problem Relation Age of Onset  . Hypertension Mother   . Hyperlipidemia Mother   . Anxiety disorder Mother   . Healthy Father   . Cancer Maternal Grandmother        skin ; also intestinal malignancy in 71s  . Diabetes Maternal Grandmother   . Dementia Maternal Grandmother   . Stroke Maternal Grandfather   . Aneurysm Maternal Grandfather        brain  . Cancer Paternal Grandmother        bone and breast  . Breast cancer Paternal Grandmother   . Mental illness Maternal Uncle   . Stroke Maternal Uncle     Review of Systems: Review of Systems  Constitutional: Negative.   Cardiovascular: Negative.    Gastrointestinal: Negative.   Neurological: Negative.     Physical Exam: Vital Signs BP (!) 146/86 (BP Location: Right Arm, Patient Position: Sitting, Cuff Size: Large)   Pulse 85   Ht 5\' 4"  (1.626 m)   Wt 232 lb (105.2 kg)   SpO2 98%   BMI 39.82 kg/m   Physical Exam Constitutional:      General: Not in acute distress.    Appearance: Normal appearance. Not ill-appearing.  HENT:     Head: Normocephalic and atraumatic.  Eyes:     Pupils: Pupils are equal, round Neck:     Musculoskeletal: Normal range of motion.  Cardiovascular:     Rate and Rhythm: Normal rate    Pulses: Normal pulses.  Pulmonary:     Effort: Pulmonary effort is normal. No respiratory distress.  Abdominal:     General: Abdomen is flat. There is no distension.  Musculoskeletal: Normal range of motion.  Skin:    General: Skin is warm and dry.     Findings: No erythema or rash.  Neurological:     General: No focal deficit present.     Mental Status: Alert and oriented to person, place, and time. Mental status is at baseline.     Motor: No weakness.  Psychiatric:        Mood and Affect: Mood normal.        Behavior: Behavior normal.    Assessment/Plan: The patient is scheduled for bilateral breast reduction with Dr. Marla Roe.  Risks, benefits, and alternatives of procedure discussed, questions answered and consent obtained.    Smoking Status: Non-smoker; Counseling Given?  N/A Last Mammogram: 05/21/2020, subsequently had ultrasound of right breast with clip placement and pathology results showing fibroadenomas.  Caprini Score: 4, moderate; Risk Factors include: Recently stopped OCPs, BMI greater than 25, and length of planned surgery. Recommendation for mechanical and pharmacological prophylaxis while hospitalized. Encourage early ambulation.   Pictures obtained: 04/22/2020  Post-op Rx sent to pharmacy: Norco, Zofran, Keflex  Patient was provided with the breast reduction and General Surgical Risk  consent document and Pain Medication Agreement prior to their appointment.  They had adequate time to read through the risk consent documents and Pain Medication Agreement. We also discussed them in person together during this preop appointment. All of their questions were answered to their satisfaction.  Recommended calling if they have any further questions.  Risk consent form and Pain Medication Agreement to be scanned into patient's chart.  The risk that can be encountered with breast reduction were discussed and include the following but not  limited to these:  Breast asymmetry, fluid accumulation, firmness of the breast, inability to breast feed, loss of nipple or areola, skin loss, decrease or no nipple sensation, fat necrosis of the breast tissue, bleeding, infection, healing delay.  There are risks of anesthesia, changes to skin sensation and injury to nerves or blood vessels.  The muscle can be temporarily or permanently injured.  You may have an allergic reaction to tape, suture, glue, blood products which can result in skin discoloration, swelling, pain, skin lesions, poor healing.  Any of these can lead to the need for revisonal surgery or stage procedures.  A reduction has potential to interfere with diagnostic procedures.  Nipple or breast piercing can increase risks of infection.  This procedure is best done when the breast is fully developed.  Changes in the breast will continue to occur over time.  Pregnancy can alter the outcomes of previous breast reduction surgery, weight gain and weigh loss can also effect the long term appearance.   The risks that can be encountered with and after liposuction were discussed and include the following but no limited to these:  Asymmetry, fluid accumulation, firmness of the area, fat necrosis with death of fat tissue, bleeding, infection, delayed healing, anesthesia risks, skin sensation changes, injury to structures including nerves, blood vessels, and  muscles which may be temporary or permanent, allergies to tape, suture materials and glues, blood products, topical preparations or injected agents, skin and contour irregularities, skin discoloration and swelling, deep vein thrombosis, cardiac and pulmonary complications, pain, which may persist, persistent pain, recurrence of the lesion, poor healing of the incision, possible need for revisional surgery or staged procedures. Thiere can also be persistent swelling, poor wound healing, rippling or loose skin, worsening of cellulite, swelling, and thermal burn or heat injury from ultrasound with the ultrasound-assisted lipoplasty technique. Any change in weight fluctuations can alter the outcome.  We discussed the likelihood of amputation/free nipple graft technique due to the length of her STN.  We discussed the risks associated with free nipple graft breast reductions, including but not limited to failure of the graft, partial loss of the graft, loss of sensation of bilateral nipple areola, complete loss of the nipple areola graft, loss of her nipple, inability to breast-feed, postoperative wounds, ongoing wound care.  We also discussed the risks associated with the pedicle technique.  We discussed that with the pedicle technique she could develop nipple areolar necrosis which would result in loss of the nipple, this would also result in ongoing wound care and possible changes in the shape of her breast.  We are in agreement to move forward with the planned procedure and she is aware that amputation technique/free nipple graft is the most likely option for her.  She is understanding of this.     Electronically signed by: Carola Rhine Jakerra Floyd, PA-C 09/19/2020 1:09 PM

## 2020-09-25 ENCOUNTER — Encounter: Payer: Self-pay | Admitting: Plastic Surgery

## 2020-09-30 ENCOUNTER — Encounter (HOSPITAL_BASED_OUTPATIENT_CLINIC_OR_DEPARTMENT_OTHER): Payer: Self-pay | Admitting: Plastic Surgery

## 2020-10-09 ENCOUNTER — Ambulatory Visit (HOSPITAL_BASED_OUTPATIENT_CLINIC_OR_DEPARTMENT_OTHER)
Admission: RE | Admit: 2020-10-09 | Discharge: 2020-10-09 | Disposition: A | Discharge status: Home / Self Care | Payer: Managed Care, Other (non HMO) | Attending: Plastic Surgery | Admitting: Plastic Surgery

## 2020-10-09 ENCOUNTER — Other Ambulatory Visit: Payer: Self-pay

## 2020-10-09 ENCOUNTER — Encounter (HOSPITAL_BASED_OUTPATIENT_CLINIC_OR_DEPARTMENT_OTHER)
Admission: RE | Disposition: A | Discharge status: Home / Self Care | Payer: Self-pay | Source: Home / Self Care | Attending: Plastic Surgery

## 2020-10-09 ENCOUNTER — Ambulatory Visit (HOSPITAL_BASED_OUTPATIENT_CLINIC_OR_DEPARTMENT_OTHER): Payer: Managed Care, Other (non HMO) | Admitting: Anesthesiology

## 2020-10-09 ENCOUNTER — Encounter (HOSPITAL_BASED_OUTPATIENT_CLINIC_OR_DEPARTMENT_OTHER): Payer: Self-pay | Admitting: Plastic Surgery

## 2020-10-09 DIAGNOSIS — N62 Hypertrophy of breast: Secondary | ICD-10-CM | POA: Insufficient documentation

## 2020-10-09 DIAGNOSIS — M545 Low back pain, unspecified: Secondary | ICD-10-CM

## 2020-10-09 DIAGNOSIS — Z793 Long term (current) use of hormonal contraceptives: Secondary | ICD-10-CM | POA: Insufficient documentation

## 2020-10-09 DIAGNOSIS — N6489 Other specified disorders of breast: Secondary | ICD-10-CM | POA: Diagnosis not present

## 2020-10-09 DIAGNOSIS — M542 Cervicalgia: Secondary | ICD-10-CM

## 2020-10-09 DIAGNOSIS — M954 Acquired deformity of chest and rib: Secondary | ICD-10-CM

## 2020-10-09 HISTORY — PX: BREAST REDUCTION SURGERY: SHX8

## 2020-10-09 LAB — POCT PREGNANCY, URINE: Preg Test, Ur: NEGATIVE

## 2020-10-09 SURGERY — BREAST REDUCTION WITH LIPOSUCTION
Anesthesia: General | Site: Breast | Laterality: Bilateral

## 2020-10-09 MED ORDER — SUFENTANIL CITRATE 50 MCG/ML IV SOLN
INTRAVENOUS | Status: DC | PRN
  Administered 2020-10-09 (×4): 10 ug via INTRAVENOUS

## 2020-10-09 MED ORDER — ACETAMINOPHEN 325 MG RE SUPP: 650.0000 mg | RECTAL | Status: DC | PRN

## 2020-10-09 MED ORDER — DROPERIDOL 2.5 MG/ML IJ SOLN
INTRAMUSCULAR | Status: DC | PRN
  Administered 2020-10-09: .625 mg via INTRAVENOUS

## 2020-10-09 MED ORDER — FENTANYL CITRATE (PF) 100 MCG/2ML IJ SOLN: 25.0000 ug | INTRAMUSCULAR | Status: DC | PRN

## 2020-10-09 MED ORDER — LIDOCAINE HCL (PF) 1 % IJ SOLN
INTRAMUSCULAR | Status: AC
  Filled 2020-10-09: qty 30

## 2020-10-09 MED ORDER — DROPERIDOL 2.5 MG/ML IJ SOLN
0.6250 mg | Freq: Once | INTRAMUSCULAR | Status: DC | PRN
Start: 2020-10-09 — End: 2020-10-09

## 2020-10-09 MED ORDER — PROPOFOL 10 MG/ML IV BOLUS
INTRAVENOUS | Status: DC | PRN
  Administered 2020-10-09: 150 mg via INTRAVENOUS

## 2020-10-09 MED ORDER — NITROGLYCERIN 2 % TD OINT: 0.5000 [in_us] | TOPICAL_OINTMENT | Freq: Four times a day (QID) | TRANSDERMAL | 0 refills | Status: DC

## 2020-10-09 MED ORDER — ACETAMINOPHEN 325 MG PO TABS: 650.0000 mg | ORAL_TABLET | ORAL | Status: DC | PRN

## 2020-10-09 MED ORDER — MIDAZOLAM HCL 5 MG/5ML IJ SOLN
INTRAMUSCULAR | Status: DC | PRN
  Administered 2020-10-09: 2 mg via INTRAVENOUS

## 2020-10-09 MED ORDER — SCOPOLAMINE 1 MG/3DAYS TD PT72
MEDICATED_PATCH | TRANSDERMAL | Status: AC
  Filled 2020-10-09: qty 1

## 2020-10-09 MED ORDER — LIDOCAINE-EPINEPHRINE 1 %-1:100000 IJ SOLN
INTRAMUSCULAR | Status: DC | PRN
  Administered 2020-10-09: 30 mL

## 2020-10-09 MED ORDER — CEFAZOLIN SODIUM-DEXTROSE 2-4 GM/100ML-% IV SOLN
2.0000 g | INTRAVENOUS | Status: AC
  Administered 2020-10-09: 2 g via INTRAVENOUS

## 2020-10-09 MED ORDER — SUFENTANIL CITRATE 50 MCG/ML IV SOLN
INTRAVENOUS | Status: AC
  Filled 2020-10-09: qty 1

## 2020-10-09 MED ORDER — BUPIVACAINE HCL (PF) 0.25 % IJ SOLN
INTRAMUSCULAR | Status: AC
  Filled 2020-10-09: qty 30

## 2020-10-09 MED ORDER — ACETAMINOPHEN 500 MG PO TABS
1000.0000 mg | ORAL_TABLET | Freq: Once | ORAL | Status: AC
  Administered 2020-10-09: 1000 mg via ORAL

## 2020-10-09 MED ORDER — NITROGLYCERIN 2 % TD OINT
TOPICAL_OINTMENT | TRANSDERMAL | Status: AC
  Filled 2020-10-09: qty 30

## 2020-10-09 MED ORDER — DEXAMETHASONE SODIUM PHOSPHATE 4 MG/ML IJ SOLN
INTRAMUSCULAR | Status: DC | PRN
  Administered 2020-10-09: 10 mg via INTRAVENOUS

## 2020-10-09 MED ORDER — CHLORHEXIDINE GLUCONATE CLOTH 2 % EX PADS: 6.0000 | MEDICATED_PAD | Freq: Once | CUTANEOUS | Status: DC

## 2020-10-09 MED ORDER — OXYCODONE HCL 5 MG/5ML PO SOLN: 5.0000 mg | Freq: Once | ORAL | Status: AC | PRN

## 2020-10-09 MED ORDER — LIDOCAINE-EPINEPHRINE 1 %-1:100000 IJ SOLN
INTRAMUSCULAR | Status: AC
  Filled 2020-10-09: qty 1

## 2020-10-09 MED ORDER — BUPIVACAINE HCL (PF) 0.25 % IJ SOLN
INTRAMUSCULAR | Status: DC | PRN
  Administered 2020-10-09: 30 mL

## 2020-10-09 MED ORDER — OXYCODONE HCL 5 MG PO TABS: 5.0000 mg | ORAL_TABLET | ORAL | Status: DC | PRN

## 2020-10-09 MED ORDER — OXYCODONE HCL 5 MG PO TABS
5.0000 mg | ORAL_TABLET | Freq: Once | ORAL | Status: AC | PRN
  Administered 2020-10-09: 5 mg via ORAL

## 2020-10-09 MED ORDER — ONDANSETRON HCL 4 MG/2ML IJ SOLN
INTRAMUSCULAR | Status: DC | PRN
  Administered 2020-10-09: 4 mg via INTRAVENOUS

## 2020-10-09 MED ORDER — SUGAMMADEX SODIUM 500 MG/5ML IV SOLN
INTRAVENOUS | Status: DC | PRN
  Administered 2020-10-09: 300 mg via INTRAVENOUS

## 2020-10-09 MED ORDER — ROCURONIUM BROMIDE 100 MG/10ML IV SOLN
INTRAVENOUS | Status: DC | PRN
  Administered 2020-10-09: 80 mg via INTRAVENOUS
  Administered 2020-10-09: 20 mg via INTRAVENOUS

## 2020-10-09 MED ORDER — SODIUM CHLORIDE 0.9% FLUSH: 3.0000 mL | INTRAVENOUS | Status: DC | PRN

## 2020-10-09 MED ORDER — NITROGLYCERIN 2 % TD OINT
TOPICAL_OINTMENT | TRANSDERMAL | Status: DC | PRN
  Administered 2020-10-09: 0.5 [in_us] via TOPICAL

## 2020-10-09 MED ORDER — SODIUM CHLORIDE 0.9 % IV SOLN: 250.0000 mL | INTRAVENOUS | Status: DC | PRN

## 2020-10-09 MED ORDER — SCOPOLAMINE 1 MG/3DAYS TD PT72
1.0000 | MEDICATED_PATCH | TRANSDERMAL | Status: DC
  Administered 2020-10-09: 1.5 mg via TRANSDERMAL

## 2020-10-09 MED ORDER — SODIUM CHLORIDE 0.9% FLUSH: 3.0000 mL | Freq: Two times a day (BID) | INTRAVENOUS | Status: DC

## 2020-10-09 MED ORDER — CEFAZOLIN SODIUM-DEXTROSE 2-4 GM/100ML-% IV SOLN
INTRAVENOUS | Status: AC
  Filled 2020-10-09: qty 100

## 2020-10-09 MED ORDER — SODIUM CHLORIDE (PF) 0.9 % IJ SOLN
INTRAMUSCULAR | Status: AC
  Filled 2020-10-09: qty 10

## 2020-10-09 MED ORDER — LACTATED RINGERS IV SOLN: INTRAVENOUS | Status: DC

## 2020-10-09 MED ORDER — PROMETHAZINE HCL 25 MG/ML IJ SOLN
INTRAMUSCULAR | Status: AC
  Filled 2020-10-09: qty 1

## 2020-10-09 MED ORDER — EPINEPHRINE PF 1 MG/ML IJ SOLN
INTRAMUSCULAR | Status: AC
  Filled 2020-10-09: qty 2

## 2020-10-09 MED ORDER — OXYCODONE HCL 5 MG PO TABS
ORAL_TABLET | ORAL | Status: AC
  Filled 2020-10-09: qty 1

## 2020-10-09 MED ORDER — PROMETHAZINE HCL 25 MG/ML IJ SOLN
6.2500 mg | INTRAMUSCULAR | Status: DC | PRN
Start: 2020-10-09 — End: 2020-10-09
  Administered 2020-10-09: 6.25 mg via INTRAVENOUS

## 2020-10-09 MED ORDER — ACETAMINOPHEN 500 MG PO TABS
ORAL_TABLET | ORAL | Status: AC
  Filled 2020-10-09: qty 2

## 2020-10-09 MED ORDER — MIDAZOLAM HCL 2 MG/2ML IJ SOLN
INTRAMUSCULAR | Status: AC
  Filled 2020-10-09: qty 2

## 2020-10-09 SURGICAL SUPPLY — 58 items
ADH SKN CLS APL DERMABOND .7 (GAUZE/BANDAGES/DRESSINGS) ×2
BAG DECANTER FOR FLEXI CONT (MISCELLANEOUS) ×2 IMPLANT
BINDER BREAST XXLRG (GAUZE/BANDAGES/DRESSINGS) ×2 IMPLANT
BIOPATCH RED 1 DISK 7.0 (GAUZE/BANDAGES/DRESSINGS) ×2 IMPLANT
BLADE HEX COATED 2.75 (ELECTRODE) ×2 IMPLANT
BLADE KNIFE PERSONA 10 (BLADE) ×4 IMPLANT
BLADE SURG 15 STRL LF DISP TIS (BLADE) ×1 IMPLANT
BLADE SURG 15 STRL SS (BLADE) ×2
CANISTER SUCT 1200ML W/VALVE (MISCELLANEOUS) ×2 IMPLANT
COVER BACK TABLE 60X90IN (DRAPES) ×2 IMPLANT
COVER MAYO STAND STRL (DRAPES) ×2 IMPLANT
COVER WAND RF STERILE (DRAPES) IMPLANT
DECANTER SPIKE VIAL GLASS SM (MISCELLANEOUS) IMPLANT
DERMABOND ADVANCED (GAUZE/BANDAGES/DRESSINGS) ×2
DERMABOND ADVANCED .7 DNX12 (GAUZE/BANDAGES/DRESSINGS) ×2 IMPLANT
DRAIN CHANNEL 19F RND (DRAIN) ×4 IMPLANT
DRAPE LAPAROSCOPIC ABDOMINAL (DRAPES) ×2 IMPLANT
DRSG OPSITE POSTOP 4X12 (GAUZE/BANDAGES/DRESSINGS) ×4 IMPLANT
DRSG OPSITE POSTOP 4X6 (GAUZE/BANDAGES/DRESSINGS) ×2 IMPLANT
DRSG PAD ABDOMINAL 8X10 ST (GAUZE/BANDAGES/DRESSINGS) ×8 IMPLANT
ELECT BLADE 4.0 EZ CLEAN MEGAD (MISCELLANEOUS) ×2
ELECT REM PT RETURN 9FT ADLT (ELECTROSURGICAL) ×2
ELECTRODE BLDE 4.0 EZ CLN MEGD (MISCELLANEOUS) ×1 IMPLANT
ELECTRODE REM PT RTRN 9FT ADLT (ELECTROSURGICAL) ×1 IMPLANT
EVACUATOR SILICONE 100CC (DRAIN) ×4 IMPLANT
GLOVE SURG ENC MOIS LTX SZ6.5 (GLOVE) ×8 IMPLANT
GLOVE SURG ENC MOIS LTX SZ7.5 (GLOVE) ×6 IMPLANT
GOWN STRL REUS W/ TWL LRG LVL3 (GOWN DISPOSABLE) ×2 IMPLANT
GOWN STRL REUS W/TWL LRG LVL3 (GOWN DISPOSABLE) ×4
MARKER SKIN DUAL TIP RULER LAB (MISCELLANEOUS) ×2 IMPLANT
NEEDLE HYPO 25X1 1.5 SAFETY (NEEDLE) ×2 IMPLANT
NS IRRIG 1000ML POUR BTL (IV SOLUTION) ×2 IMPLANT
PACK BASIN DAY SURGERY FS (CUSTOM PROCEDURE TRAY) ×2 IMPLANT
PAD ALCOHOL SWAB (MISCELLANEOUS) ×4 IMPLANT
PAD FOAM SILICONE BACKED (GAUZE/BANDAGES/DRESSINGS) IMPLANT
PENCIL SMOKE EVACUATOR (MISCELLANEOUS) ×4 IMPLANT
PIN SAFETY STERILE (MISCELLANEOUS) IMPLANT
SLEEVE SCD COMPRESS KNEE MED (STOCKING) ×2 IMPLANT
SPONGE LAP 18X18 RF (DISPOSABLE) ×8 IMPLANT
STRIP SUTURE WOUND CLOSURE 1/2 (MISCELLANEOUS) ×4 IMPLANT
SUT MNCRL AB 3-0 PS2 18 (SUTURE) IMPLANT
SUT MNCRL AB 4-0 PS2 18 (SUTURE) ×14 IMPLANT
SUT MON AB 3-0 SH 27 (SUTURE) ×12
SUT MON AB 3-0 SH27 (SUTURE) ×6 IMPLANT
SUT MON AB 5-0 PS2 18 (SUTURE) ×2 IMPLANT
SUT PDS 3-0 CT2 (SUTURE) ×10
SUT PDS AB 2-0 CT2 27 (SUTURE) IMPLANT
SUT PDS II 3-0 CT2 27 ABS (SUTURE) ×5 IMPLANT
SUT SILK 3 0 PS 1 (SUTURE) ×2 IMPLANT
SUT VIC AB 3-0 SH 27 (SUTURE)
SUT VIC AB 3-0 SH 27X BRD (SUTURE) IMPLANT
SUT VICRYL 4-0 PS2 18IN ABS (SUTURE) IMPLANT
SYR BULB IRRIG 60ML STRL (SYRINGE) ×2 IMPLANT
SYR CONTROL 10ML LL (SYRINGE) ×2 IMPLANT
TOWEL GREEN STERILE FF (TOWEL DISPOSABLE) ×4 IMPLANT
TRAY DSU PREP LF (CUSTOM PROCEDURE TRAY) ×2 IMPLANT
UNDERPAD 30X36 HEAVY ABSORB (UNDERPADS AND DIAPERS) ×4 IMPLANT
YANKAUER SUCT BULB TIP NO VENT (SUCTIONS) ×2 IMPLANT

## 2020-10-09 NOTE — Anesthesia Procedure Notes (Signed)
Procedure Name: Intubation Date/Time: 10/09/2020 7:31 AM Performed by: Willa Frater, CRNA Pre-anesthesia Checklist: Patient identified, Emergency Drugs available, Suction available and Patient being monitored Patient Re-evaluated:Patient Re-evaluated prior to induction Oxygen Delivery Method: Circle system utilized Preoxygenation: Pre-oxygenation with 100% oxygen Induction Type: IV induction Ventilation: Mask ventilation without difficulty Laryngoscope Size: Mac and 3 Grade View: Grade I Tube type: Oral Number of attempts: 1 Airway Equipment and Method: Stylet and Oral airway Placement Confirmation: ETT inserted through vocal cords under direct vision,  positive ETCO2 and breath sounds checked- equal and bilateral Secured at: 22 cm Tube secured with: Tape Dental Injury: Teeth and Oropharynx as per pre-operative assessment

## 2020-10-09 NOTE — Transfer of Care (Signed)
Immediate Anesthesia Transfer of Care Note  Patient: [name]  Procedure(s) Performed: BREAST REDUCTION (Bilateral Breast)  Patient Location: PACU  Anesthesia Type:General  Level of Consciousness: sedated  Airway & Oxygen Therapy: Patient Spontanous Breathing and Patient connected to face mask oxygen  Post-op Assessment: Report given to RN and Post -op Vital signs reviewed and stable  Post vital signs: Reviewed and stable  Last Vitals:  Vitals Value Taken Time  BP    Temp    Pulse    Resp    SpO2      Last Pain:  Vitals:   10/09/20 0638  TempSrc: Oral  PainSc: 0-No pain         Complications: No complications documented.

## 2020-10-09 NOTE — Interval H&P Note (Signed)
History and Physical Interval Note:  10/09/2020 6:53 AM  Teresa Bright  has presented today for surgery, with the diagnosis of mammary hypertrophy.  The various methods of treatment have been discussed with the patient and family. After consideration of risks, benefits and other options for treatment, the patient has consented to  Procedure(s) with comments: BREAST REDUCTION WITH LIPOSUCTION (Bilateral) - 3 hours as a surgical intervention.  The patient's history has been reviewed, patient examined, no change in status, stable for surgery.  I have reviewed the patient's chart and labs.  Questions were answered to the patient's satisfaction.     Loel Lofty Rakesha Dalporto

## 2020-10-09 NOTE — Anesthesia Postprocedure Evaluation (Signed)
Anesthesia Post Note  Patient: [name]  Procedure(s) Performed: BREAST REDUCTION (Bilateral Breast)     Patient location during evaluation: PACU Anesthesia Type: General Level of consciousness: sedated Pain management: pain level controlled Vital Signs Assessment: post-procedure vital signs reviewed and stable Respiratory status: spontaneous breathing and respiratory function stable Cardiovascular status: stable Postop Assessment: no apparent nausea or vomiting Anesthetic complications: no   No complications documented.  Last Vitals:  Vitals:   10/09/20 1221 10/09/20 1318  BP: (!) 135/95 (!) 127/97  Pulse: (!) 104 (!) 106  Resp: 17 16  Temp: 36.9 C 36.8 C  SpO2: 97% 98%    Last Pain:  Vitals:   10/09/20 1309  TempSrc:   PainSc: 2                  Merlinda Frederick

## 2020-10-09 NOTE — Anesthesia Preprocedure Evaluation (Signed)
Anesthesia Evaluation  Patient identified by MRN, date of birth, ID band Patient awake    Reviewed: Allergy & Precautions, NPO status , Patient's Chart, lab work & pertinent test results  Airway Mallampati: II  TM Distance: >3 FB Neck ROM: Full    Dental no notable dental hx.    Pulmonary neg pulmonary ROS,    Pulmonary exam normal breath sounds clear to auscultation       Cardiovascular negative cardio ROS Normal cardiovascular exam Rhythm:Regular Rate:Normal     Neuro/Psych  Headaches, negative psych ROS   GI/Hepatic Neg liver ROS, GERD  ,  Endo/Other  Morbid obesity  Renal/GU negative Renal ROS  negative genitourinary   Musculoskeletal negative musculoskeletal ROS (+)   Abdominal   Peds negative pediatric ROS (+)  Hematology negative hematology ROS (+)   Anesthesia Other Findings   Reproductive/Obstetrics negative OB ROS                             Anesthesia Physical Anesthesia Plan  ASA: II  Anesthesia Plan: General   Post-op Pain Management:    Induction: Intravenous  PONV Risk Score and Plan: Scopolamine patch - Pre-op, Treatment may vary due to age or medical condition, Midazolam, Ondansetron and Dexamethasone  Airway Management Planned: Oral ETT  Additional Equipment:   Intra-op Plan:   Post-operative Plan: Extubation in OR  Informed Consent: I have reviewed the patients History and Physical, chart, labs and discussed the procedure including the risks, benefits and alternatives for the proposed anesthesia with the patient or authorized representative who has indicated his/her understanding and acceptance.     Dental advisory given  Plan Discussed with: CRNA, Anesthesiologist and Surgeon  Anesthesia Plan Comments:         Anesthesia Quick Evaluation

## 2020-10-09 NOTE — Discharge Instructions (Addendum)
INSTRUCTIONS FOR AFTER SURGERY   You will likely have some questions about what to expect following your operation.  The following information will help you and your family understand what to expect when you are discharged from the hospital.  Following these guidelines will help ensure a smooth recovery and reduce risks of complications.  Postoperative instructions include information on: diet, wound care, medications and physical activity.  AFTER SURGERY Expect to go home after the procedure.  In some cases, you may need to spend one night in the hospital for observation.  DIET This surgery does not require a specific diet.  However, I have to mention that the healthier you eat the better your body can start healing. It is important to increasing your protein intake.  This means limiting the foods with added sugar.  Focus on fruits and vegetables and some meat. It is very important to drink water after your surgery.  If your urine is bright yellow, then it is concentrated, and you need to drink more water.  As a general rule after surgery, you should have 8 ounces of water every hour while awake.  If you find you are persistently nauseated or unable to take in liquids let us know.  NO TOBACCO USE or EXPOSURE.  This will slow your healing process and increase the risk of a wound.  WOUND CARE Nitro paste 1/2 inch to each areola three times a day for 2 days. If you have a drain: Clean with baby wipes for 3-5 days and then you can shower.  If you have a binder you may remove it to shower and then put it back on. If you have steri-strips / tape directly attached to your skin leave them in place. It is OK to get these wet.  No baths, pools or hot tubs for two weeks. We close your incision to leave the smallest and best-looking scar. No ointment or creams on your incisions until given the go ahead.  Especially not Neosporin (Too many skin reactions with this one).  A few weeks after surgery you can use  Mederma and start massaging the scar. We ask you to wear your binder or sports bra for the first 6 weeks around the clock, including while sleeping. This provides added comfort and helps reduce the fluid accumulation at the surgery site.  ACTIVITY No heavy lifting until cleared by the doctor.  It is OK to walk and climb stairs. In fact, moving your legs is very important to decrease your risk of a blood clot.  It will also help keep you from getting deconditioned.  Every 1 to 2 hours get up and walk for 5 minutes. This will help with a quicker recovery back to normal.  Let pain be your guide so you don't do too much.  NO, you cannot do the spring cleaning and don't plan on taking care of anyone else.  This is your time for TLC.   WORK Everyone returns to work at different times. As a rough guide, most people take at least 1 - 2 weeks off prior to returning to work. If you need documentation for your job, bring the forms to your postoperative follow up visit.  DRIVING Arrange for someone to bring you home from the hospital.  You may be able to drive a few days after surgery but not while taking any narcotics or valium.  BOWEL MOVEMENTS Constipation can occur after anesthesia and while taking pain medication.  It is important to stay  ahead for your comfort.  We recommend taking Milk of Magnesia (2 tablespoons; twice a day) while taking the pain pills.  SEROMA This is fluid your body tried to put in the surgical site.  This is normal but if it creates excessive pain and swelling let us know.  It usually decreases in a few weeks.  MEDICATIONS and PAIN CONTROL At your preoperative visit for you history and physical you were given the following medications: 1. An antibiotic: Start this medication when you get home and take according to the instructions on the bottle. 2. Zofran 4 mg:  This is to treat nausea and vomiting.  You can take this every 6 hours as needed and only if needed. 3. Norco  (hydrocodone/acetaminophen) 5/325 mg:  This is only to be used after you have taken the motrin or the tylenol. Every 8 hours as needed. Over the counter Medication to take: 4. Ibuprofen (Motrin) 600 mg:  Take this every 6 hours.  If you have additional pain then take 500 mg of the tylenol.  Only take the Norco after you have tried these two. 5. Miralax or stool softener of choice: Take this according to the bottle if you take the [place] Call your surgeon's office if any of the following occur: . Fever 101 degrees F or greater . Excessive bleeding or fluid from the incision site. . Pain that increases over time without aid from the medications . Redness, warmth, or pus draining from incision sites . Persistent nausea or inability to take in liquids . Severe misshapen area that underwent the operation.  No Tylenol until 12:44 pm   Post Anesthesia Home Care Instructions  Activity: Get plenty of rest for the remainder of the day. A responsible individual must stay with you for 24 hours following the procedure.  For the next 24 hours, DO NOT: -Drive a car -Paediatric nurse -Drink alcoholic beverages -Take any medication unless instructed by your physician -Make any legal decisions or sign important papers.  Meals: Start with liquid foods such as gelatin or soup. Progress to regular foods as tolerated. Avoid greasy, spicy, heavy foods. If nausea and/or vomiting occur, drink only clear liquids until the nausea and/or vomiting subsides. Call your physician if vomiting continues.  Special Instructions/Symptoms: Your throat may feel dry or sore from the anesthesia or the breathing tube placed in your throat during surgery. If this causes discomfort, gargle with warm salt water. The discomfort should disappear within 24 hours.  If you had a scopolamine patch placed behind your ear for the management of post- operative nausea and/or vomiting:  1. The medication in the patch is  effective for 72 hours, after which it should be removed.  Wrap patch in a tissue and discard in the trash. Wash hands thoroughly with soap and water. 2. You may remove the patch earlier than 72 hours if you experience unpleasant side effects which may include dry mouth, dizziness or visual disturbances. 3. Avoid touching the patch. Wash your hands with soap and water after contact with the patch.  About my Jackson-Pratt Bulb Drain  What is a Jackson-Pratt bulb? A Jackson-Pratt is a soft, round device used to collect drainage. It is connected to a long, thin drainage catheter, which is held in place by one or two small stiches near your surgical incision site. When the bulb is squeezed, it forms a vacuum, forcing the drainage to empty into the bulb.  Emptying the Jackson-Pratt bulb- To empty the  bulb: 1. Release the plug on the top of the bulb. 2. Pour the bulb's contents into a measuring container which your nurse will provide. 3. Record the time emptied and amount of drainage. Empty the drain(s) as often as your     doctor or nurse recommends.  Date                  Time                    Amount (Drain 1)                 Amount (Drain 2)  _____________________________________________________________________  _____________________________________________________________________  _____________________________________________________________________  _____________________________________________________________________  _____________________________________________________________________  _____________________________________________________________________  _____________________________________________________________________  _____________________________________________________________________  Squeezing the Jackson-Pratt Bulb- To squeeze the bulb: 1. Make sure the plug at the top of the bulb is open. 2. Squeeze the bulb tightly in your fist. You will hear air squeezing from  the bulb. 3. Replace the plug while the bulb is squeezed. 4. Use a safety pin to attach the bulb to your clothing. This will keep the catheter from     pulling at the bulb insertion site.  When to call your doctor- Call your doctor if:  Drain site becomes red, swollen or hot.  You have a fever greater than 101 degrees F.  There is oozing at the drain site.  Drain falls out (apply a guaze bandage over the drain hole and secure it with tape).  Drainage increases daily not related to activity patterns. (You will usually have more drainage when you are active than when you are resting.)  Drainage has a bad odor.

## 2020-10-09 NOTE — Op Note (Signed)
Breast Reduction Op note:    DATE OF PROCEDURE: 10/09/2020  LOCATION: [facility]  SURGEON: Lyndee Leo Sanger Jackeline Gutknecht, DO  ASSISTANT: Roetta Sessions, PA  PREOPERATIVE DIAGNOSIS 1. Macromastia 2. Neck Pain 3. Back Pain  POSTOPERATIVE DIAGNOSIS 1. Macromastia 2. Neck Pain 3. Back Pain  PROCEDURES 1. Bilateral breast reduction.  Right reduction 1759 g, Left reduction 2694 g  COMPLICATIONS: None.  DRAINS: bilateral  INDICATIONS FOR PROCEDURE Teresa Bright is a 35 y.o. year-old female born on 22-Jan-1986,with a history of symptomatic macromastia with concominant back pain, neck pain, shoulder grooving from her bra.   MRN: [id number]  CONSENT Informed consent was obtained directly from the patient. The risks, benefits and alternatives were fully discussed. Specific risks including but not limited to bleeding, infection, hematoma, seroma, scarring, pain, nipple necrosis, asymmetry, poor cosmetic results, and need for further surgery were discussed. The patient had ample opportunity to have her questions answered to her satisfaction.  DESCRIPTION OF PROCEDURE  Patient was brought into the operating room and placed in a supine position.  SCDs were placed and appropriate padding was performed.  Antibiotics were given. The patient underwent general anesthesia and the chest was prepped and draped in a sterile fashion.  A timeout was performed and all information was confirmed to be correct.  Right side: Preoperative markings were confirmed.  Incision lines were injected with 1% Xylocaine with epinephrine.  After waiting for vasoconstriction, the marked lines were incised.  A Wise-pattern superomedial breast reduction was performed by de-epithelializing the pedicle, using bovie to create the superomedial pedicle, and removing breast tissue from the lateral and inferior portions of the breast.  Care was taken to not undermine the breast pedicle. Hemostasis was  achieved.  The nipple was gently rotated into position and the soft tissue closed with 4-0 Monocryl.   The pocket was irrigated and hemostasis confirmed.  The deep tissues were approximated with 3-0 PDS and Monocryl sutures and the skin was closed with deep dermal and subcuticular 4-0 Monocryl sutures.  The nipple and skin flaps had good capillary refill at the end of the procedure.    Left side: Preoperative markings were confirmed.  Incision lines were injected with 1% Xylocaine with epinephrine.  After waiting for vasoconstriction, the marked lines were incised.  A Wise-pattern superomedial breast reduction was performed by de-epithelializing the pedicle, using bovie to create the superomedial pedicle, and removing breast tissue from the superior, lateral, and inferior portions of the breast.  Care was taken to not undermine the breast pedicle. Hemostasis was achieved.  The nipple was gently rotated into position and the soft tissue was closed with 4-0 Monocryl.  The patient was sat upright and size and shape symmetry was confirmed.  The pocket was irrigated and hemostasis confirmed.  The deep tissues were approximated with 3-0 PDS and Monocryl sutures and the skin was closed with deep dermal and subcuticular 4-0 Monocryl sutures.  Dermabond was applied.  A breast binder and ABDs were placed.  The nipple and skin flaps had good capillary refill at the end of the procedure.  The patient tolerated the procedure well. The patient was allowed to wake from anesthesia and taken to the recovery room in satisfactory condition.  The advanced practice practitioner (APP) assisted throughout the case.  The APP was essential in retraction and counter traction when needed to make the case progress smoothly.  This retraction and assistance made it possible to see the tissue plans for the procedure.  The assistance  was needed for blood control, tissue re-approximation and assisted with closure of the incision site.

## 2020-10-10 ENCOUNTER — Telehealth: Payer: Self-pay

## 2020-10-10 ENCOUNTER — Telehealth: Payer: Self-pay | Admitting: *Deleted

## 2020-10-10 ENCOUNTER — Encounter (HOSPITAL_BASED_OUTPATIENT_CLINIC_OR_DEPARTMENT_OTHER): Payer: Self-pay | Admitting: Plastic Surgery

## 2020-10-10 NOTE — Telephone Encounter (Signed)
Patient called to state the pharmacy would not fill prescription for the nitroglycerin as they said the directions were not clear. Please resend to pharmacy.   The patient is out of the nitro-bid paper that they were given after surgery and is unsure if the nitroglycerin comes with that and would like clarification on this.   The patient complains of left breast soreness when the drains are emptied and would like to know if this is normal.

## 2020-10-13 LAB — SURGICAL PATHOLOGY

## 2020-10-13 NOTE — Telephone Encounter (Signed)
Prescription denied per Dr. Marla Roe.  Patient no longer needs the prescription.  Patient aware.//AB/CMA

## 2020-10-13 NOTE — Telephone Encounter (Signed)
Called and spoke with the patient on (10/10/20) regarding the message below.  Informed the patient that I spoke with Dr. Marla Roe regarding the refill request for the Nitroglycerin ointment.  She stated that she will not need to continue on the Nitroglycerin ointment.  Patient verbalized understanding and agreed.//AB/CMA

## 2020-10-15 ENCOUNTER — Telehealth: Payer: Self-pay

## 2020-10-15 NOTE — Telephone Encounter (Signed)
Received fax from [facility] questioning amount to dispense nitroglycerin. Faxed back to discontinue/cancel prescription. Patient no longer needs medication.

## 2020-10-21 ENCOUNTER — Other Ambulatory Visit: Payer: Self-pay

## 2020-10-21 ENCOUNTER — Ambulatory Visit (INDEPENDENT_AMBULATORY_CARE_PROVIDER_SITE_OTHER): Payer: Managed Care, Other (non HMO) | Admitting: Plastic Surgery

## 2020-10-21 ENCOUNTER — Encounter: Payer: Self-pay | Admitting: Plastic Surgery

## 2020-10-21 DIAGNOSIS — N62 Hypertrophy of breast: Secondary | ICD-10-CM

## 2020-10-21 NOTE — Progress Notes (Signed)
Patient is a 35 year old female here for follow-up on her breast reduction.  She is doing extremely well postop.  Her bowels are moving and her pain is well controlled.  No sign of infection.  She would like to keep the honeycomb in place until her next visit.  I am a little worried about her left nipple areola..  I am concerned that the binder might have been too tight.  I am good to have her use Xeroform daily.  Once she runs out of the Xeroform she can transition to Vaseline.  If there is any change she knows to give [place] a call.

## 2020-10-22 NOTE — Telephone Encounter (Signed)
Called and spoke with the patient regarding the message below.  Per Dr. Marla Roe- She and lift no more than 10lbs and increase slowly.  Patient verbalized understanding and agreed.//AB/CMA

## 2020-10-27 ENCOUNTER — Telehealth: Payer: Self-pay

## 2020-10-27 NOTE — Telephone Encounter (Signed)
Patient called to say that she has questions regarding cleaning her nipple area.  Please call.

## 2020-10-27 NOTE — Telephone Encounter (Signed)
Called and spoke with the patient regarding the message below.  Patient wanted to know if there was anything else she can use to clean her breast.  She stated that she's using the soap and water, but she didn't know if there is something else to clean with.   She stated that she pulled some dry skin off her breast and she noticed a little odor.  Informed the patient she should continue cleaning with the soap and water, until she comes back for her next appointment.  We don't want her to use something that might cause an infection.  Patient verbalized understanding and agreed.//AB/CMA

## 2020-11-04 ENCOUNTER — Ambulatory Visit (INDEPENDENT_AMBULATORY_CARE_PROVIDER_SITE_OTHER): Payer: Managed Care, Other (non HMO) | Admitting: Surgical

## 2020-11-04 ENCOUNTER — Other Ambulatory Visit: Payer: Self-pay

## 2020-11-04 DIAGNOSIS — N62 Hypertrophy of breast: Secondary | ICD-10-CM

## 2020-11-04 DIAGNOSIS — G8929 Other chronic pain: Secondary | ICD-10-CM

## 2020-11-04 DIAGNOSIS — M546 Pain in thoracic spine: Secondary | ICD-10-CM

## 2020-11-04 NOTE — Progress Notes (Signed)
Patient is a 35 year old female here for follow-up after bilateral breast reduction with Dr. Marla Roe on 10/09/2020.  She is just shy of 4 weeks postop.  Overall she is doing well.  She has noticed some burning sensation of the right inframammary fold occasionally.  She is not having any fevers or chills.  No shortness of breath or chest pain.  No nausea or vomiting.  She is having normal bowel movements.  Chaperone present on exam On exam Steri-Strips in place.  These were removed to reveal mostly intact bilateral breast incisions.  She does have a small wound along the inframammary fold and vertical limb junction of each breast.  The left breast wound is approximately 1 x 4 mm and the right breast wound is approximately 2 x 3 mm.  She also has a superficial wound on the left NAC that is approximately 2 x 7 mm.  No nipple areolar necrosis is noted.  Bilateral NAC's are viable with good color.  No subcutaneous fluid collections noted with palpation.  Recommend continue her compressive garment 24/7.  Recommend avoiding strenuous activities.  Recommend following up in 3 weeks for reevaluation.  Recommend Vaseline and gauze to bilateral inframammary fold wounds and Vaseline and gauze to the left NAC wound.  There is no sign of infection, seroma, hematoma.  Recommend calling with questions or concerns.

## 2020-11-26 ENCOUNTER — Ambulatory Visit (INDEPENDENT_AMBULATORY_CARE_PROVIDER_SITE_OTHER): Payer: Managed Care, Other (non HMO) | Admitting: Surgical

## 2020-11-26 ENCOUNTER — Other Ambulatory Visit: Payer: Self-pay

## 2020-11-26 DIAGNOSIS — G8929 Other chronic pain: Secondary | ICD-10-CM

## 2020-11-26 DIAGNOSIS — Z719 Counseling, unspecified: Secondary | ICD-10-CM

## 2020-11-26 DIAGNOSIS — N62 Hypertrophy of breast: Secondary | ICD-10-CM

## 2020-11-26 DIAGNOSIS — M546 Pain in thoracic spine: Secondary | ICD-10-CM

## 2020-11-26 DIAGNOSIS — Z9889 Other specified postprocedural states: Secondary | ICD-10-CM

## 2020-11-26 NOTE — Progress Notes (Signed)
Patient is a 35 year old female here for follow-up after bilateral reduction on 10/09/2020 with Dr. Marla Roe.  She is approximately 7 weeks postop.  Patient reports she is overall doing well.  She is not having any infectious symptoms.  She does still apply Vaseline to the bilateral breast areas.  Chaperone present on exam On exam bilateral NAC's are viable.  Bilateral breast incisions are overall intact.  She does have a small wound that is approximately 1 x 1 mm at the junction of the vertical limb and NAC complex of the left breast.  There is no surrounding erythema.  There is no foul odor or purulent drainage noted. No subcutaneous fluid collections noted with palpation.  Recommend continue to wear compressive garment throughout the day.  She no longer needs to wear at night.  Recommend avoiding normal bra until 3 months after surgery.  We discussed scheduling additional follow-up or following up as needed.  I discussed with patient that if anytime she has any questions or concerns or would like to be reevaluated we can certainly see her again.  Recommend avoiding submerging the incisions in the pool or ocean at this time until all of the incisions have completely healed.  We discussed the use of scar creams.  Picture was taken and placed in the patient's chart with patient's permission.

## 2020-12-08 ENCOUNTER — Telehealth: Payer: Self-pay | Admitting: Surgical

## 2020-12-08 NOTE — Telephone Encounter (Signed)
Hi. Teresa Bright called to confirm that it is okay for her to swimming with her incision. Please call to advise [phone]. Thanks.

## 2020-12-09 NOTE — Telephone Encounter (Signed)
Called and [facility] @ 9:06am) asking the patient to RTC regarding the message below.//AB/CMA

## 2020-12-09 NOTE — Telephone Encounter (Signed)
Spoke with the patient regarding the message below.  Informed the patient that I spoke with Matthew,PA-C,and he stated for her to make sure there is no open wounds before going swimming.  But going to the beach she will need to wait for 3 months.  Patient verbalized understanding and agreed.//AB/CMA

## 2021-02-27 ENCOUNTER — Telehealth: Payer: Self-pay | Admitting: Plastic Surgery

## 2021-02-27 NOTE — Telephone Encounter (Signed)
Patient is calling to inform that she experiencing hardness beside her nipple and . Sx 4/21. Please call to advise [phone]. Thank you.

## 2021-02-27 NOTE — Telephone Encounter (Signed)
Pt reports hardness is beside L nipple and has been there for a few weeks. Pt reports no pain, swelling, warmness; has been tender at times, reports no fever, n/v. Adv pt that it sounds like fat necrosis, which is normal but would rather a provider confirm or adv after they assess her. Adv her that providers adv patients to massage the area.   Sent [facility] a message to get pt in to see [place], [place] or Dr. Marla Roe. Pt conveyed understanding.

## 2021-09-23 ENCOUNTER — Telehealth: Payer: Self-pay

## 2021-09-23 NOTE — Telephone Encounter (Signed)
PT CALLED AND STATED THAT DR. Elonda Husky WANTED HER TO GET A CT SCAN EVERY YEAR BECAUSE OF HER CONDITION AND WANTED TO KNOW WAS IT TIME.  SHE WOULD LIKE A NURSE TO CALL HER. ?

## 2021-09-24 ENCOUNTER — Other Ambulatory Visit: Payer: Self-pay | Admitting: Obstetrics & Gynecology

## 2021-09-24 DIAGNOSIS — D3911 Neoplasm of uncertain behavior of right ovary: Secondary | ICD-10-CM

## 2021-09-24 NOTE — Telephone Encounter (Signed)
I have ordered it and Charleston Ropes is going to pre cert, so it is in motion

## 2021-10-16 ENCOUNTER — Ambulatory Visit: Payer: Managed Care, Other (non HMO) | Admitting: Physician Assistant

## 2021-10-16 ENCOUNTER — Encounter: Payer: Self-pay | Admitting: Physician Assistant

## 2021-10-16 VITALS — BP 137/94 | HR 78 | Temp 98.0°F | Wt 236.4 lb

## 2021-10-16 DIAGNOSIS — K219 Gastro-esophageal reflux disease without esophagitis: Secondary | ICD-10-CM | POA: Diagnosis not present

## 2021-10-16 DIAGNOSIS — D3911 Neoplasm of uncertain behavior of right ovary: Secondary | ICD-10-CM

## 2021-10-16 DIAGNOSIS — R1084 Generalized abdominal pain: Secondary | ICD-10-CM | POA: Diagnosis not present

## 2021-10-16 MED ORDER — PANTOPRAZOLE SODIUM 40 MG PO TBEC: 40.0000 mg | DELAYED_RELEASE_TABLET | Freq: Every day | ORAL | 1 refills | Status: DC

## 2021-10-16 NOTE — Patient Instructions (Signed)
It was great to see you! ? ?Recommend protonix 40 mg daily ? ?Follow-up at your convenience for a physical exam. ? ?Gastroenterology referral placed today. ? ?Take care, ? ?Inda Coke PA-C  ?

## 2021-10-16 NOTE — Progress Notes (Signed)
Teresa Bright is a 36 y.o. female here for a follow up referred by former PCP, Teresa Bright.  ? ?History of Present Illness:  ? ?Chief Complaint  ?Patient presents with  ? transfer of care  ? GI Problem  ?  Pt c/o Stomach pain comes and goes. She goes to the bathoom a lot and Not to long after eating she has to go to the bathroom. Has tried imodium so she wouldn't go as often. Which did help. Referral to GI  ? ? ?Abdominal Pain  ?Teresa Bright presents with c/o intermittent abdominal pain and excessive BMs that has been onset for several years, but became more frequent as of recently. States that she has noticed following a meal, she experiences stomach pain and a need to move her bowels soon after. Upon looking at the [facility]ol stool chart, she described her current stools as ranging from type 4, 5, and 6. She did admit that when she was a teenager she had a previous provider who suspected she had early IBS, but this wasn't further evaluated at the time. When asked about what she believed the cause of her multiple BMs could be, she voiced anxiety as a possible contributing factor as well as what she is eating. ? ?In an effort to manage this, she has tried taking imodium which did assist in lowering the amount of times she went to the restroom. She did take OTC metamucil for a couple of day while on vacation, but doesn't believe she took it long enough to see any effects. Despite this she is interested in a referral to GI for further evaluated.  ? ?GERD ?In addition to her stomach pain, pt has still be experiencing GERD sx. States that she was previously compliant with taking omeprazole 20 mg daily, but has since started taking this prn. Reports that she was unsure f she should continue to take the medication daily or not, so she thought it best to change to an ad needed basis. Although she was previously experiencing a relief of sx, this unfortunately is not the case anymore. Upon further discussion she is interested in  trialing another medication to help with this issue.  ? ?Hx of Right Ovarian Cyst/ Hx Ovarian Tumor  ?Pt states that following the birth of her first child, she was found to have a cyst on her right ovary. Due to the severity of this, she decided to go ahead and have this removed, but unfortunately due to the complexity, her right ovary had to be removed as well. Following this, her right ovary was biopsied and shown to be borderline malignant. Since this, she has been regularly following up with Dr. Elonda Husky, OBGYN about this issue and has been managing well.  ? ? ?Past Medical History:  ?Diagnosis Date  ? GERD (gastroesophageal reflux disease)   ? History of chickenpox   ? Migraine   ? Pregnant 08/20/2014  ? ?  ?Social History  ? ?Tobacco Use  ? Smoking status: Never  ? Smokeless tobacco: Never  ?Vaping Use  ? Vaping Use: Never used  ?Substance Use Topics  ? Alcohol use: Yes  ?  Comment: occassionally   ? Drug use: No  ? ? ?Past Surgical History:  ?Procedure Laterality Date  ? BREAST REDUCTION SURGERY Bilateral 10/09/2020  ? Procedure: BREAST REDUCTION;  Surgeon: Wallace Going, DO;  Location: [facility];  Service: Plastics;  Laterality: Bilateral;  3 hours  ? cysterectomy    ? LAPAROSCOPIC OVARIAN CYSTECTOMY  Right 03/03/2016  ? Procedure: LAPAROSCOPIC RIGHT OVARIAN CYSTECTOMY AND RIGHT OOPHORECTOMY;  Surgeon: Florian Buff, MD;  Location: [facility];  Service: Gynecology;  Laterality: Right;  ? OOPHORECTOMY Right   ? WISDOM TOOTH EXTRACTION    ? ? ?Family History  ?Problem Relation Age of Onset  ? Hypertension Mother   ? Hyperlipidemia Mother   ? Anxiety disorder Mother   ? Healthy Father   ? Cancer Maternal Grandmother   ?     skin ; also intestinal malignancy in 37s  ? Diabetes Maternal Grandmother   ? Dementia Maternal Grandmother   ? Stroke Maternal Grandfather   ? Aneurysm Maternal Grandfather   ?     brain  ? Cancer Paternal Grandmother   ?     bone and breast  ? Breast cancer Paternal  Grandmother   ? Mental illness Maternal Uncle   ? Stroke Maternal Uncle   ? ? ?No Known Allergies ? ?Current Medications:  ? ?Current Outpatient Medications:  ?  Cholecalciferol (VITAMIN D3 PO), Take 1,000 mg by mouth daily., Disp: , Rfl:  ?  pantoprazole (PROTONIX) 40 MG tablet, Take 1 tablet (40 mg total) by mouth daily., Disp: 90 tablet, Rfl: 1  ? ?Review of Systems:  ? ?ROS ?Negative unless otherwise specified per HPI. ?Vitals:  ? ?Vitals:  ? 10/16/21 1000  ?BP: (!) 137/94  ?Pulse: 78  ?Temp: 98 ?F (36.7 ?C)  ?TempSrc: Temporal  ?SpO2: 97%  ?Weight: 236 lb 6 oz (107.2 kg)  ?   ?Body mass index is 40.57 kg/m?. ? ?Physical Exam:  ? ?Physical Exam ?Vitals and nursing note reviewed.  ?Constitutional:   ?   General: She is not in acute distress. ?   Appearance: She is well-developed. She is not ill-appearing or toxic-appearing.  ?Cardiovascular:  ?   Rate and Rhythm: Normal rate and regular rhythm.  ?   Pulses: Normal pulses.  ?   Heart sounds: Normal heart sounds, S1 normal and S2 normal.  ?Pulmonary:  ?   Effort: Pulmonary effort is normal.  ?   Breath sounds: Normal breath sounds.  ?Abdominal:  ?   General: Abdomen is flat. Bowel sounds are normal.  ?   Palpations: Abdomen is soft.  ?   Tenderness: There is no abdominal tenderness.  ?Skin: ?   General: Skin is warm and dry.  ?Neurological:  ?   Mental Status: She is alert.  ?   GCS: GCS eye subscore is 4. GCS verbal subscore is 5. GCS motor subscore is 6.  ?Psychiatric:     ?   Speech: Speech normal.     ?   Behavior: Behavior normal. Behavior is cooperative.  ? ? ?Assessment and Plan:  ? ?Gastroesophageal reflux disease, unspecified whether esophagitis present ?Uncontrolled  ?Stop omeprazole 20 mg daily ?Trial protonix 40 mg daily  ?Referral to gastroenterology ?Follow-up as needed ? ?Generalized abdominal pain ?No red flags ?Suspect possible IBS ?Placed referral to GI for further evaluation  ? 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 ?I,Havlyn C Ratchford,acting as a scribe for [employer], PA.,have documented all relevant documentation on the behalf of Inda Coke, PA,as directed by  Inda Coke, PA while in the presence of Inda Coke, [place]. ? ?IInda Coke, PA, have reviewed all documentation for this visit. The documentation on 10/16/21 for the exam, diagnosis, procedures, and orders are all accurate and complete. ? ? ?Inda Coke, PA-C ? ?

## 2021-10-29 ENCOUNTER — Other Ambulatory Visit (HOSPITAL_COMMUNITY): Payer: Managed Care, Other (non HMO)

## 2021-11-25 ENCOUNTER — Encounter: Payer: Self-pay | Admitting: Physician Assistant

## 2021-12-07 NOTE — Progress Notes (Signed)
Subjective:    Teresa Bright is a 36 y.o. female and is here for a comprehensive physical exam.  HPI  Health Maintenance Due  Topic Date Due   PAP SMEAR-Modifier  10/10/2020    Acute Concerns: None  Chronic Issues: Anxiety Patient has been dealing with this for the past 2 months. States she has been feeling more anxious recently. Reports she has been having some intrusive thoughts as well. States she felt anxious when she was taking care of her friends pet and while completing her work projects. She has been experiencing some chest tightness but thinks this could be related to her anxiety. States she has not tried any treatment for this. Symptoms seems to be worsening recently. Patient does admit she does have a family hx of panic attacks in her mother. Denies SI/HI.   Chest pain  Patient complain of chest pain/tightness that has been onset for past several weeks. She states she has been feeling chest tightness more often. She described this as "dull pain". Located on left side of her chest. States she has not noticed any chest pain when exercising. She is currently not smoking or drinking excessive alcohol. She does drink coffee in the morning. Thinks this might be related to her anxiety symptoms. She has had EKG in 2017 which was normal. She does have a fam hx of stroke in her maternal grandmother and uncle. No other significant alleviating or aggravating factors. No wheezing. No shortness of breath. Denies palpitations.   GERD Patient is currently taking Protonix 40 mg daily with no complications. She is tolerating her medication well with no side effects. States she has not taken this medication for few days and was having increased GERD sx. She states she has tried Pepcid and this has helped. She will be following up with GI next week. No rectal bleeding. Denies any other concerning sx.   Health Maintenance: Immunizations -- UTD Covid-UTD PAP -- overdue -- counseled Bone Density  -- N/A Diet -- Balanced Sleep habits -- No concerns Exercise -- None Current Weight -- 236 Ib (107 kg) Weight History: Wt Readings from Last 10 Encounters:  12/08/21 236 lb (107 kg)  10/16/21 236 lb 6 oz (107.2 kg)  10/09/20 232 lb 9.4 oz (105.5 kg)  09/19/20 232 lb (105.2 kg)  08/07/20 230 lb 9.6 oz (104.6 kg)  05/14/20 224 lb (101.6 kg)  04/22/20 220 lb 9.6 oz (100.1 kg)  01/11/20 (!) 210 lb (95.3 kg)  12/06/19 215 lb 3.2 oz (97.6 kg)  07/04/19 210 lb (95.3 kg)   Body mass index is 41.81 kg/m. Mood -- Anxious  Patient's last menstrual period was 11/18/2021 (approximate). Period characteristics -- Regular Birth control -- None    reports current alcohol use.  Tobacco Use: Low Risk  (12/08/2021)   Patient History    Smoking Tobacco Use: Never    Smokeless Tobacco Use: Never    Passive Exposure: Not on file        12/08/2021    8:19 AM  Depression screen PHQ 2/9  Decreased Interest 0  Down, Depressed, Hopeless 0  PHQ - 2 Score 0     Other providers/specialists: Patient Care Team: Inda Coke, [place] as PCP - General (Physician Assistant)   PMHx, SurgHx, SocialHx, Medications, and Allergies were reviewed in the Visit Navigator and updated as appropriate.   Past Medical History:  Diagnosis Date   GERD (gastroesophageal reflux disease)    History of chickenpox    Migraine  Pregnant 08/20/2014     Past Surgical History:  Procedure Laterality Date   BREAST REDUCTION SURGERY Bilateral 10/09/2020   Procedure: BREAST REDUCTION;  Surgeon: Wallace Going, DO;  Location: [facility];  Service: Plastics;  Laterality: Bilateral;  3 hours   cysterectomy     LAPAROSCOPIC OVARIAN CYSTECTOMY Right 03/03/2016   Procedure: LAPAROSCOPIC RIGHT OVARIAN CYSTECTOMY AND RIGHT OOPHORECTOMY;  Surgeon: Florian Buff, MD;  Location: [facility];  Service: Gynecology;  Laterality: Right;   OOPHORECTOMY Right    WISDOM TOOTH EXTRACTION       Family History   Problem Relation Age of Onset   Hypertension Mother    Hyperlipidemia Mother    Anxiety disorder Mother    Healthy Father    Healthy Brother    Cancer Maternal Grandmother        skin ; also intestinal malignancy in 28s   Diabetes Maternal Grandmother    Dementia Maternal Grandmother    Stroke Maternal Grandfather    Aneurysm Maternal Grandfather        brain   Cancer Paternal Grandmother        bone and breast   Breast cancer Paternal Grandmother    Mental illness Maternal Uncle    Stroke Maternal Uncle     Social History   Tobacco Use   Smoking status: Never   Smokeless tobacco: Never  Vaping Use   Vaping Use: Never used  Substance Use Topics   Alcohol use: Yes    Comment: occassionally    Drug use: No    Review of Systems:   Review of Systems  Constitutional:  Negative for chills, fever, malaise/fatigue and weight loss.  HENT:  Negative for hearing loss, sinus pain and sore throat.   Respiratory:  Negative for cough and hemoptysis.   Cardiovascular:  Negative for chest pain, palpitations, leg swelling and PND.  Gastrointestinal:  Negative for abdominal pain, constipation, diarrhea, heartburn, nausea and vomiting.  Genitourinary:  Negative for dysuria, frequency and urgency.  Musculoskeletal:  Negative for back pain, myalgias and neck pain.  Skin:  Negative for itching and rash.  Neurological:  Negative for dizziness, tingling, seizures and headaches.  Endo/Heme/Allergies:  Negative for polydipsia.  Psychiatric/Behavioral:  Negative for depression. The patient is not nervous/anxious.     Objective:   BP 120/80 (BP Location: Left Arm, Patient Position: Sitting, Cuff Size: Large)   Pulse 83   Temp 97.7 F (36.5 C) (Temporal)   Ht '5\' 3"'$  (1.6 m)   Wt 236 lb (107 kg)   LMP 11/18/2021 (Approximate)   SpO2 97%   BMI 41.81 kg/m   General Appearance:    Alert, cooperative, no distress, appears stated age  Head:    Normocephalic, without obvious abnormality,  atraumatic  Eyes:    PERRL, conjunctiva/corneas clear, EOM's intact, fundi    benign, both eyes  Ears:    Normal TM's and external ear canals, both ears  Nose:   Nares normal, septum midline, mucosa normal, no drainage    or sinus tenderness  Throat:   Lips, mucosa, and tongue normal; teeth and gums normal  Neck:   Supple, symmetrical, trachea midline, no adenopathy;    thyroid:  no enlargement/tenderness/nodules; no carotid   bruit or JVD  Back:     Symmetric, no curvature, ROM normal, no CVA tenderness  Lungs:     Clear to auscultation bilaterally, respirations unlabored  Chest Wall:    No tenderness or deformity   Heart:  Regular rate and rhythm, S1 and S2 normal, no murmur, rub   or gallop  Breast Exam:    Deferred  Abdomen:     Soft, non-tender, bowel sounds active all four quadrants,    no masses, no organomegaly  Genitalia:    Deferred  Rectal:    Deferred  Extremities:   Extremities normal, atraumatic, no cyanosis or edema  Pulses:   2+ and symmetric all extremities  Skin:   Skin color, texture, turgor normal, no rashes or lesions  Lymph nodes:   Cervical, supraclavicular, and axillary nodes normal  Neurologic:   CNII-XII intact, normal strength, sensation and reflexes    throughout    Assessment/Plan:   Encounter for general adult medical examination with abnormal findings Today patient counseled on age appropriate routine health concerns for screening and prevention, each reviewed and up to date or declined. Immunizations reviewed and up to date or declined. Labs ordered and reviewed. Risk factors for depression reviewed and negative. Hearing function and visual acuity are intact. ADLs screened and addressed as needed. Functional ability and level of safety reviewed and appropriate. Education, counseling and referrals performed based on assessed risks today. Patient provided with a copy of personalized plan for preventive services.  Anxiety No red flags Declines  intervention Continue to monitor I discussed with patient that if they develop any SI, to tell someone immediately and seek medical attention.  Chest pain, unspecified type EKG tracing is personally reviewed.  EKG notes NSR.  No acute changes.  Continue to work on anxiety reduction and follow-up with GI No exertional component, doubt ACS Consider cardiology consult if persists Recommend ER if worsens  Gastroesophageal reflux disease, unspecified whether esophagitis present GI referral pending -- she will be seeing the providers next week  Obesity, unspecified classification, unspecified obesity type, unspecified whether serious comorbidity present Continue to work on healthy diet and exercise as able.  Patient Counseling:   '[x]'$     Nutrition: Stressed importance of moderation in sodium/caffeine intake, saturated fat and cholesterol, caloric balance, sufficient intake of fresh fruits, vegetables, fiber, calcium, iron, and 1 mg of folate supplement per day (for females capable of pregnancy).   '[x]'$      Stressed the importance of regular exercise.    '[x]'$     Substance Abuse: Discussed cessation/primary prevention of tobacco, alcohol, or other drug use; driving or other dangerous activities under the influence; availability of treatment for abuse.    '[x]'$      Injury prevention: Discussed safety belts, safety helmets, smoke detector, smoking near bedding or upholstery.    '[x]'$      Sexuality: Discussed sexually transmitted diseases, partner selection, use of condoms, avoidance of unintended pregnancy  and contraceptive alternatives.    '[x]'$     Dental health: Discussed importance of regular tooth brushing, flossing, and dental visits.   '[x]'$      Health maintenance and immunizations reviewed. Please refer to Health maintenance section.    I,Savera Zaman,acting as a Education administrator for [employer], PA.,have documented all relevant documentation on the behalf of Inda Coke, PA,as directed  by  Inda Coke, PA while in the presence of Inda Coke, [place].   I, Inda Coke, [place], have reviewed all documentation for this visit. The documentation on 12/08/21 for the exam, diagnosis, procedures, and orders are all accurate and complete.   Inda Coke, PA-C [place]

## 2021-12-08 ENCOUNTER — Ambulatory Visit (INDEPENDENT_AMBULATORY_CARE_PROVIDER_SITE_OTHER): Payer: Managed Care, Other (non HMO) | Admitting: Physician Assistant

## 2021-12-08 ENCOUNTER — Encounter: Payer: Self-pay | Admitting: Physician Assistant

## 2021-12-08 VITALS — BP 120/80 | HR 83 | Temp 97.7°F | Ht 63.0 in | Wt 236.0 lb

## 2021-12-08 DIAGNOSIS — R079 Chest pain, unspecified: Secondary | ICD-10-CM

## 2021-12-08 DIAGNOSIS — E8881 Metabolic syndrome: Secondary | ICD-10-CM | POA: Insufficient documentation

## 2021-12-08 DIAGNOSIS — Z0001 Encounter for general adult medical examination with abnormal findings: Secondary | ICD-10-CM

## 2021-12-08 DIAGNOSIS — K219 Gastro-esophageal reflux disease without esophagitis: Secondary | ICD-10-CM

## 2021-12-08 DIAGNOSIS — E669 Obesity, unspecified: Secondary | ICD-10-CM

## 2021-12-08 DIAGNOSIS — F419 Anxiety disorder, unspecified: Secondary | ICD-10-CM

## 2021-12-08 LAB — COMPREHENSIVE METABOLIC PANEL
ALT: 26 U/L (ref 0–35)
AST: 17 U/L (ref 0–37)
Albumin: 4.3 g/dL (ref 3.5–5.2)
Alkaline Phosphatase: 66 U/L (ref 39–117)
BUN: 11 mg/dL (ref 6–23)
CO2: 27 mEq/L (ref 19–32)
Calcium: 9.6 mg/dL (ref 8.4–10.5)
Chloride: 105 mEq/L (ref 96–112)
Creatinine, Ser: 0.74 mg/dL (ref 0.40–1.20)
GFR: 103.92 mL/min (ref >60.00)
Glucose, Bld: 104 mg/dL — ABNORMAL HIGH (ref 70–99)
Potassium: 3.9 mEq/L (ref 3.5–5.1)
Sodium: 140 mEq/L (ref 135–145)
Total Bilirubin: 0.9 mg/dL (ref 0.2–1.2)
Total Protein: 7.4 g/dL (ref 6.0–8.3)

## 2021-12-08 LAB — LIPID PANEL
Cholesterol: 201 mg/dL — ABNORMAL HIGH (ref 0–200)
HDL: 46.9 mg/dL (ref >39.00)
LDL Cholesterol: 128 mg/dL — ABNORMAL HIGH (ref 0–99)
NonHDL: 154.02
Total CHOL/HDL Ratio: 4
Triglycerides: 130 mg/dL (ref 0.0–149.0)
VLDL: 26 mg/dL (ref 0.0–40.0)

## 2021-12-08 LAB — CBC WITH DIFFERENTIAL/PLATELET
Basophils Absolute: 0.1 10*3/uL (ref 0.0–0.1)
Basophils Relative: 0.8 % (ref 0.0–3.0)
Eosinophils Absolute: 0.1 10*3/uL (ref 0.0–0.7)
Eosinophils Relative: 1.2 % (ref 0.0–5.0)
HCT: 42.6 % (ref 36.0–46.0)
Hemoglobin: 14.1 g/dL (ref 12.0–15.0)
Lymphocytes Relative: 28 % (ref 12.0–46.0)
Lymphs Abs: 2 10*3/uL (ref 0.7–4.0)
MCHC: 33 g/dL (ref 30.0–36.0)
MCV: 87.3 fl (ref 78.0–100.0)
Monocytes Absolute: 0.7 10*3/uL (ref 0.1–1.0)
Monocytes Relative: 9.1 % (ref 3.0–12.0)
Neutro Abs: 4.4 10*3/uL (ref 1.4–7.7)
Neutrophils Relative %: 60.9 % (ref 43.0–77.0)
Platelets: 296 10*3/uL (ref 150.0–400.0)
RBC: 4.87 Mil/uL (ref 3.87–5.11)
RDW: 13.2 % (ref 11.5–15.5)
WBC: 7.3 10*3/uL (ref 4.0–10.5)

## 2021-12-08 LAB — HEMOGLOBIN A1C: Hgb A1c MFr Bld: 5.8 % (ref 4.6–6.5)

## 2021-12-08 LAB — TSH: TSH: 0.76 u[IU]/mL (ref 0.35–5.50)

## 2021-12-08 NOTE — Patient Instructions (Signed)
It was great to see you! ? ?Please go to the lab for blood work.  ? ?Our office will call you with your results unless you have chosen to receive results via MyChart. ? ?If your blood work is normal we will follow-up each year for physicals and as scheduled for chronic medical problems. ? ?If anything is abnormal we will treat accordingly and get you in for a follow-up. ? ?Take care, ? ?Ayako Tapanes ?  ? ? ?

## 2021-12-17 NOTE — Progress Notes (Signed)
12/18/2021 Teresa Bright [id number] May 20, 1986  Referring provider: Inda Coke, PA Primary GI doctor: Dr. Lyndel Safe  ASSESSMENT AND PLAN:   Gastroesophageal reflux disease, unspecified whether esophagitis present Will start the patient on a PPI, did better with omeprazole 20 than pantoprazole, will send in omeprazole 40 mg daily.   No alarm symptoms, will not schedule for EGD yet, check celiac, h pylori, had normal CBC, CMET, TSH with PCP Lifestyle changes discussed, avoid NSAIDS, ETOH If labs positive or symptoms not responding will schedule for EGD -     IgA; Future -     Tissue transglutaminase, IgA; Future -     H. pylori antibody, IgG; Future -     omeprazole (PRILOSEC) 40 MG capsule; Take 1 capsule (40 mg total) by mouth daily before breakfast.  Class 2 obesity due to excess calories without serious comorbidity with body mass index (BMI) of 37.0 to 37.9 in adult Body mass index is 40.51 kg/m.  -Patient has been advised to make an attempt to improve diet and exercise patterns to aid in weight loss. -Recommended diet heavy in fruits and veggies and low in animal meats, cheeses, and dairy products, appropriate calorie intake  Alternating constipation and diarrhea -     High sensitivity CRP; Future -     Sedimentation rate; Future Sounds more like IBS C or IBS mixed.  - Increase fiber/ water intake, decrease caffeine, increase activity level. -Will add on Benefiber, avoid lactose, FODMAP discussed If inflammatory labs elevated or worsening symptoms, can consider endoscopic evaluation.    Patient Care Team: Inda Coke, [place] as PCP - General (Physician Assistant)  HISTORY OF PRESENT ILLNESS: 36 y.o. female with a past medical history of insulin resistance, morbid obesity, abdomen D deficiency, ovarian cyst and others listed below presents for evaluation of GERD, excessive bowel movements, and abdominal pain.   Teresa Bright was younger previously told Teresa Bright had IBS and GERD,  never had any testing for it.  10/16/2021 saw primary care for reflux started on pantoprazole 40 mg daily.  Patient states GERD has gotten worse, will feel acid into her throat.  Has had weight gain over the years.  Has occ issues with dysphagia with pills only. Denies issues with food but does report regurgitation at times of food.  Denies melena, Teresa Bright  denies AB bloating.  Teresa Bright has LUQ AB pain worse with food at times, can be worse with ice cream which will also cause a BM.  Teresa Bright reports nausea intermittent,  without vomiting. Once a week, nothing better or worse Teresa Bright denies nocturnal symptoms.   Teresa Bright reports NSAID use, Ibuprofen 2 pills once a week.  Teresa Bright denies ETOH use.   Teresa Bright denies family history of gallbladder issues.   Has solid versus loose stools alternating.  Teresa Bright will have BM every other day or every 3 days, has not had BM since Tuesday/Wednesday.  Then will have one day of 3-4 times in a day broken pieces, loose.  Worse with icecream/stress.  No blood in the stool. Teresa Bright does have urgency.  No rectal pain.   CBC  12/08/2021  HGB 14.1 MCV 87.3 without evidence of anemia WBC 7.3 Platelets 296.0 Kidney function 12/08/2021  BUN 11 Cr 0.74  Potassium 3.9   LFTs 12/08/2021  AST 17 ALT 26 Alkphos 66 TBili 0.9 12/08/2021 TSH 0.76  Current Medications:        Current Outpatient Medications (Other):    Cholecalciferol (VITAMIN D3 PO), Take 1,000 mg by mouth daily.  omeprazole (PRILOSEC) 40 MG capsule, Take 1 capsule (40 mg total) by mouth daily before breakfast.  Medical History:  Past Medical History:  Diagnosis Date   GERD (gastroesophageal reflux disease)    History of chickenpox    Migraine    Pregnant 08/20/2014   Allergies: No Known Allergies   Surgical History:  Teresa Bright  has a past surgical history that includes Wisdom tooth extraction; Oophorectomy (Right); cysterectomy; Laparoscopic ovarian cystectomy (Right, 03/03/2016); and Breast reduction surgery (Bilateral,  10/09/2020). Family History:  Her family history includes Aneurysm in her maternal grandfather; Anxiety disorder in her mother; Breast cancer in her paternal grandmother; Cancer in her maternal grandmother and paternal grandmother; Dementia in her maternal grandmother; Diabetes in her maternal grandmother; Healthy in her brother and father; Hyperlipidemia in her mother; Hypertension in her mother; Mental illness in her maternal uncle; Stroke in her maternal grandfather and maternal uncle. Social History:   reports that Teresa Bright has never smoked. Teresa Bright has never used smokeless tobacco. Teresa Bright reports current alcohol use. Teresa Bright reports that Teresa Bright does not use drugs.  REVIEW OF SYSTEMS  : All other systems reviewed and negative except where noted in the History of Present Illness.   PHYSICAL EXAM: BP 120/80   Pulse 95   Ht '5\' 4"'$  (1.626 m)   Wt 236 lb (107 kg)   LMP 11/18/2021 (Approximate)   BMI 40.51 kg/m  General:   Pleasant, well developed female in no acute distress Head:   Normocephalic and atraumatic. Eyes:  sclerae anicteric,conjunctive pink  Heart:   regular rate and rhythm Pulm:  Clear anteriorly; no wheezing Abdomen:   Soft, Obese AB, Active bowel sounds. No tenderness . , No organomegaly appreciated. Rectal: Not evaluated Extremities:  Without edema. Msk: Symmetrical without gross deformities. Peripheral pulses intact.  Neurologic:  Alert and  oriented x4;  No focal deficits.  Skin:   Dry and intact without significant lesions or rashes. Psychiatric:  Cooperative. Normal mood and affect.    Vladimir Crofts, PA-C 9:49 AM

## 2021-12-18 ENCOUNTER — Ambulatory Visit: Payer: Managed Care, Other (non HMO) | Admitting: Physician Assistant

## 2021-12-18 ENCOUNTER — Encounter: Payer: Self-pay | Admitting: Physician Assistant

## 2021-12-18 ENCOUNTER — Other Ambulatory Visit (INDEPENDENT_AMBULATORY_CARE_PROVIDER_SITE_OTHER): Payer: Managed Care, Other (non HMO)

## 2021-12-18 VITALS — BP 120/80 | HR 95 | Ht 64.0 in | Wt 236.0 lb

## 2021-12-18 DIAGNOSIS — Z6837 Body mass index (BMI) 37.0-37.9, adult: Secondary | ICD-10-CM | POA: Diagnosis not present

## 2021-12-18 DIAGNOSIS — E6609 Other obesity due to excess calories: Secondary | ICD-10-CM | POA: Diagnosis not present

## 2021-12-18 DIAGNOSIS — K219 Gastro-esophageal reflux disease without esophagitis: Secondary | ICD-10-CM | POA: Diagnosis not present

## 2021-12-18 DIAGNOSIS — R198 Other specified symptoms and signs involving the digestive system and abdomen: Secondary | ICD-10-CM

## 2021-12-18 LAB — H. PYLORI ANTIBODY, IGG: H Pylori IgG: NEGATIVE

## 2021-12-18 LAB — SEDIMENTATION RATE: Sed Rate: 36 mm/hr — ABNORMAL HIGH (ref 0–20)

## 2021-12-18 LAB — HIGH SENSITIVITY CRP: CRP, High Sensitivity: 3.74 mg/L (ref 0.000–5.000)

## 2021-12-18 MED ORDER — OMEPRAZOLE 40 MG PO CPDR: 40.0000 mg | DELAYED_RELEASE_CAPSULE | Freq: Every day | ORAL | 0 refills | Status: DC

## 2021-12-18 NOTE — Progress Notes (Signed)
Agree with assessment/plan RG 

## 2021-12-18 NOTE — Patient Instructions (Signed)
Your provider has requested that you go to the basement level for lab work before leaving today. Press "B" on the elevator. The lab is located at the first door on the left as you exit the elevator.  Please take your proton pump inhibitor medication, omeprazole  Please take this medication 30 minutes to 1 hour before meals- this makes it more effective.  Avoid spicy and acidic foods Avoid fatty foods Limit your intake of coffee, tea, alcohol, and carbonated drinks Work to maintain a healthy weight Keep the head of the bed elevated at least 3 inches with blocks or a wedge pillow if you are having any nighttime symptoms Stay upright for 2 hours after eating Avoid meals and snacks three to four hours before bedtime  FIBER SUPPLEMENT You can do metamucil or fibercon once or twice a day but if this causes gas/bloating please switch to Benefiber or Citracel.  Fiber is good for constipation/diarrhea/irritable bowel syndrome.  It can also help with weight loss and can help lower your bad cholesterol (LDL).  Please do 1 TBSP in the morning in water, coffee, or tea.  It can take up to a month before you can see a difference with your bowel movements.  It is cheapest from [employer], [employer], [employer].     FODMAP stands for fermentable oligo-, di-, mono-saccharides and polyols (1). These are the scientific terms used to classify groups of carbs that are notorious for triggering digestive symptoms like bloating, gas and stomach pain.   FODMAPs are found in a wide range of foods in varying amounts. Some foods contain just one type, while others contain several.  The main dietary sources of the four groups of FODMAPs include:  Oligosaccharides: Wheat, rye, legumes and various fruits and vegetables, such as garlic and onions.  Disaccharides: Milk, yogurt and soft cheese. Lactose is the main carb.  Monosaccharides: Various fruit including figs and mangoes, and sweeteners such as honey and agave nectar.  Fructose is the main carb.  Polyols: Certain fruits and vegetables including blackberries and lychee, as well as some low-calorie sweeteners like those in sugar-free gum.   Keep a food diary. This will help you identify foods that cause symptoms. Write down: What you eat and when. What symptoms you have. When symptoms occur in relation to your meals. Avoid foods that cause symptoms. Talk with your dietitian about other ways to get the same nutrients that are in these foods. Eat your meals slowly, in a relaxed setting. Aim to eat 5-6 small meals per day. Do not skip meals. Drink enough fluids to keep your urine clear or pale yellow. If dairy products cause your symptoms to flare up, try eating less of them. You might be able to handle yogurt better than other dairy products because it contains bacteria that help with digestion.

## 2021-12-21 LAB — IGA: Immunoglobulin A: 189 mg/dL (ref 47–310)

## 2021-12-21 LAB — TISSUE TRANSGLUTAMINASE, IGA: (tTG) Ab, IgA: <1 U/mL

## 2022-01-27 ENCOUNTER — Encounter (INDEPENDENT_AMBULATORY_CARE_PROVIDER_SITE_OTHER): Payer: Self-pay

## 2022-02-17 ENCOUNTER — Other Ambulatory Visit (HOSPITAL_COMMUNITY)
Admission: RE | Admit: 2022-02-17 | Discharge: 2022-02-17 | Disposition: A | Discharge status: Home / Self Care | Payer: Managed Care, Other (non HMO) | Source: Ambulatory Visit | Attending: Adult Health | Admitting: Adult Health

## 2022-02-17 ENCOUNTER — Ambulatory Visit (INDEPENDENT_AMBULATORY_CARE_PROVIDER_SITE_OTHER): Payer: Managed Care, Other (non HMO) | Admitting: Adult Health

## 2022-02-17 ENCOUNTER — Encounter: Payer: Self-pay | Admitting: Adult Health

## 2022-02-17 VITALS — BP 125/87 | HR 71 | Ht 63.25 in | Wt 233.0 lb

## 2022-02-17 DIAGNOSIS — F419 Anxiety disorder, unspecified: Secondary | ICD-10-CM | POA: Insufficient documentation

## 2022-02-17 DIAGNOSIS — Z01419 Encounter for gynecological examination (general) (routine) without abnormal findings: Secondary | ICD-10-CM | POA: Diagnosis not present

## 2022-02-17 MED ORDER — BUSPIRONE HCL 5 MG PO TABS: 5.0000 mg | ORAL_TABLET | Freq: Three times a day (TID) | ORAL | 3 refills | Status: DC

## 2022-02-17 NOTE — Progress Notes (Signed)
Patient ID: Teresa Bright, female   DOB: August 14, 1985, 36 y.o.   MRN: [id number] History of Present Illness: Teresa Bright is a 36 tear  old white female, married, G2P1001, in for a well woman gyn exam and pap. She had physical with PCP 12/08/21. She has skipped a period recently and wakes up hot. Feels anxious at times.  She had CT 10/09/21  in [place] was normal, will get [place] a copy, it was in follow up on having cyst and tumor removed in 2017. She has seen GI recently for ?IBS.  PCP is Teresa Munson PA.    Current Medications, Allergies, Past Medical History, Past Surgical History, Family History and Social History were reviewed in [employer] record.     Review of Systems: Patient denies any headaches, hearing loss, fatigue, blurred vision, shortness of breath, chest pain, abdominal pain, problems with urination, or intercourse. No joint pain or mood swings.  See HPI for positives   Physical Exam:BP 125/87 (BP Location: Left Arm, Patient Position: Sitting, Cuff Size: Large)   Pulse 71   Ht 5' 3.25" (1.607 m)   Wt 233 lb (105.7 kg)   LMP 02/04/2022   BMI 40.95 kg/m   General:  Well developed, well nourished, no acute distress Skin:  Warm and dry Neck:  Midline trachea, normal thyroid, good ROM, no lymphadenopathy Lungs; Clear to auscultation bilaterally Breast:  No dominant palpable mass, retraction, or nipple discharge, sp breast reduction Cardiovascular: Regular rate and rhythm Abdomen:  Soft, non tender, no hepatosplenomegaly Pelvic:  External genitalia is normal in appearance, no lesions.  The vagina is normal in appearance. Urethra has no lesions or masses. The cervix is bulbous, pap with HR HPV genotyping performed.  Uterus is felt to be normal size, shape, and contour.  No adnexal masses or tenderness noted.Bladder is non tender, no masses felt. Extremities/musculoskeletal:  No swelling or varicosities noted, no clubbing or cyanosis Psych:  No mood changes, alert  and cooperative,seems happy AA is 2 Fall risk is low    02/17/2022   10:04 AM 12/08/2021    8:19 AM 10/16/2021   10:03 AM  Depression screen PHQ 2/9  Decreased Interest 0 0 0  Down, Depressed, Hopeless 0 0 0  PHQ - 2 Score 0 0 0  Altered sleeping 2    Tired, decreased energy 3    Change in appetite 0    Feeling bad or failure about yourself  0    Trouble concentrating 1    Moving slowly or fidgety/restless 0    Suicidal thoughts 0    PHQ-9 Score 6         02/17/2022   10:04 AM 12/08/2021    9:12 AM  GAD 7 : Generalized Anxiety Score  Nervous, Anxious, on Edge 1 2  Control/stop worrying 1 2  Worry too much - different things 1 2  Trouble relaxing 1 3  Restless 0 0  Easily annoyed or irritable 2 2  Afraid - awful might happen 1 2  Total GAD 7 Score 7 13  Anxiety Difficulty  Somewhat difficult      Upstream - 02/17/22 1012       Pregnancy Intention Screening   Does the patient want to become pregnant in the next year? Unsure    Does the patient's partner want to become pregnant in the next year? Unsure    Would the patient like to discuss contraceptive options today? No      Contraception Wrap Up  Current Method Female Condom    End Method Female Condom            Examination chaperoned by Levy Pupa LPN   Impression and Plan: 1. Encounter for gynecological examination with Papanicolaou smear of cervix Pap sent Physical with PCP Labs with PCP Pelvic US in 1 year for follow up - Cytology - PAP( [facility])  2. Anxiety Will try buspar Meds ordered this encounter  Medications   busPIRone (BUSPAR) 5 MG tablet    Sig: Take 1 tablet (5 mg total) by mouth 3 (three) times daily.    Dispense:  90 tablet    Refill:  3    Order Specific Question:   Supervising Provider    Answer:   Tania Ade H [[id number]]   Follow up in 8 weeks for ROS

## 2022-02-19 LAB — CYTOLOGY - PAP
Comment: NEGATIVE
Diagnosis: NEGATIVE
High risk HPV: NEGATIVE

## 2022-02-20 IMAGING — US US BREAST*R* LIMITED INC AXILLA
1 series · 13 of 21 positions shown · non-contrast
Comparison: None.

CLINICAL DATA: 34-year-old female presenting for pre reduction
mammogram.

EXAM:
DIGITAL DIAGNOSTIC BILATERAL MAMMOGRAM WITH CAD AND TOMO
ULTRASOUND RIGHT BREAST

[Series 1: us breast*right* limited inc axilla · 0.06mm/px · 13 of 21 slices shown]
[im 1/21]
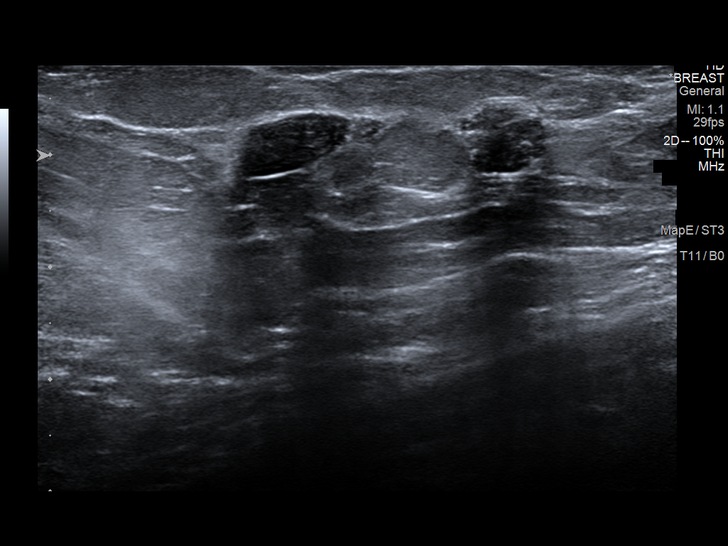
[im 3/21]
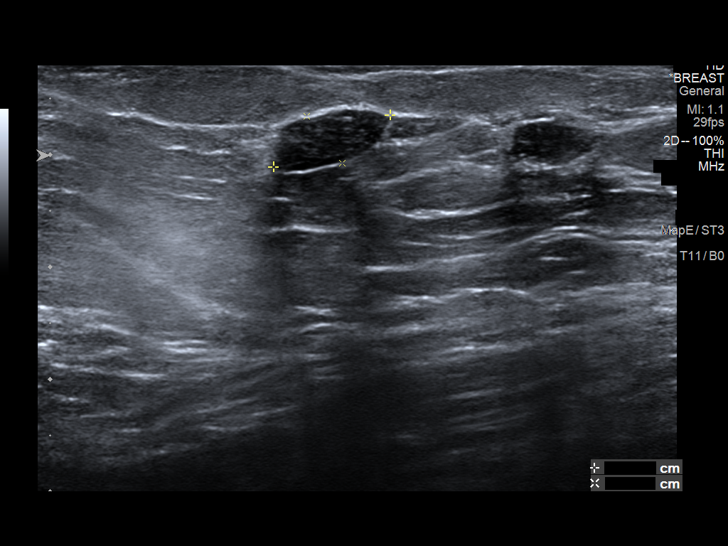
[im 5/21]
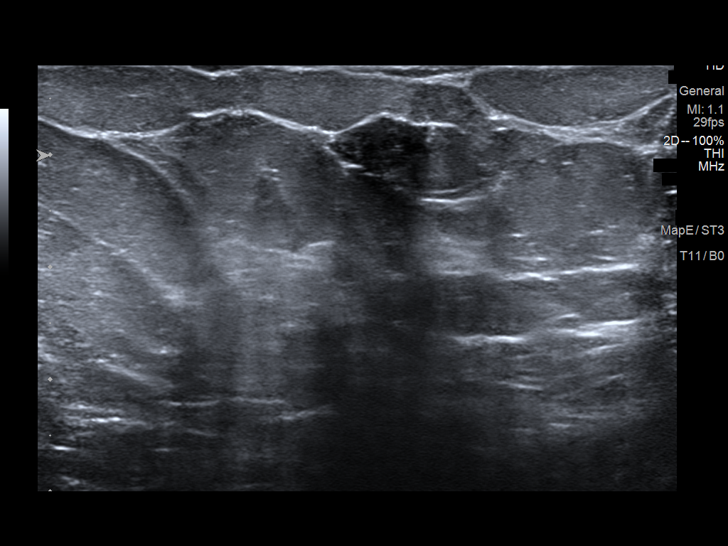
[im 6/21]
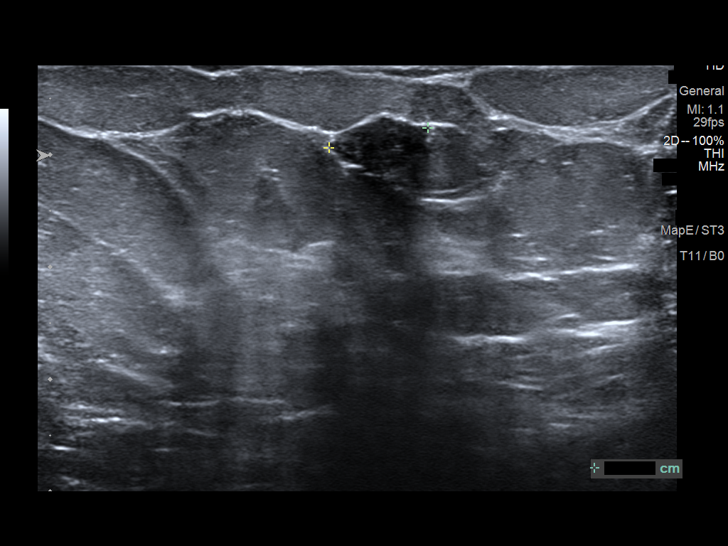
[im 8/21]
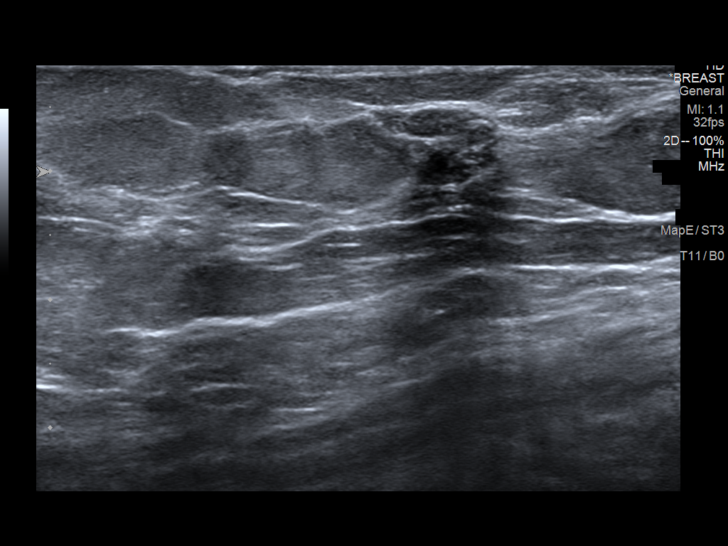
[im 9/21]
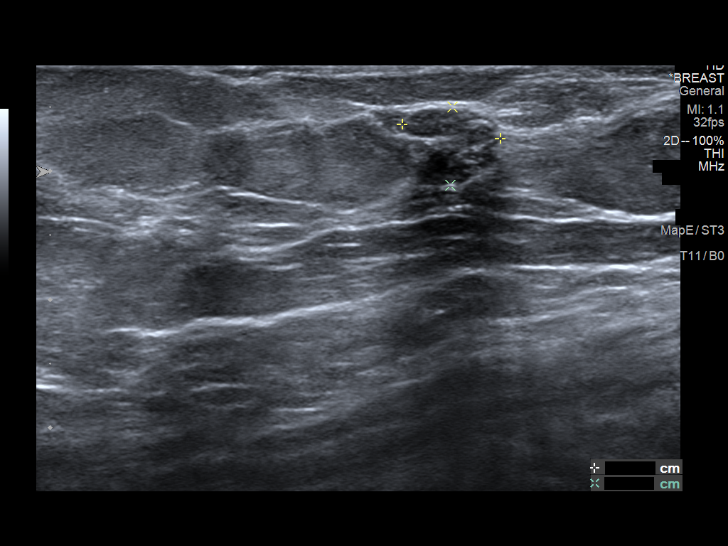
[im 11/21]
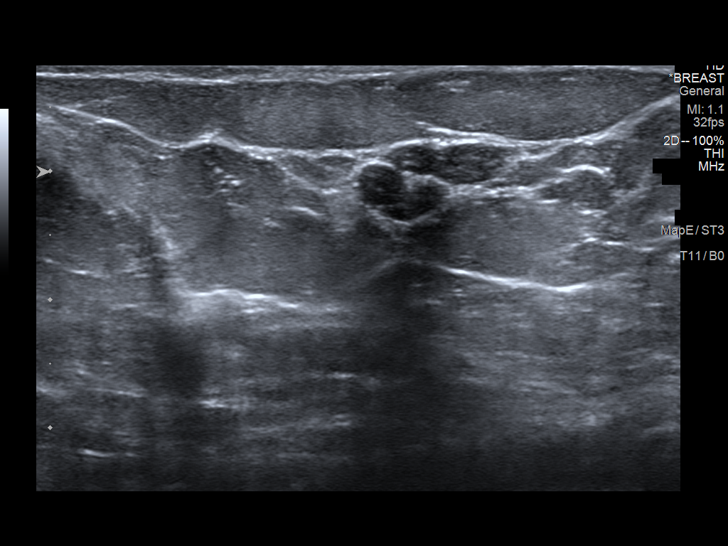
[im 13/21]
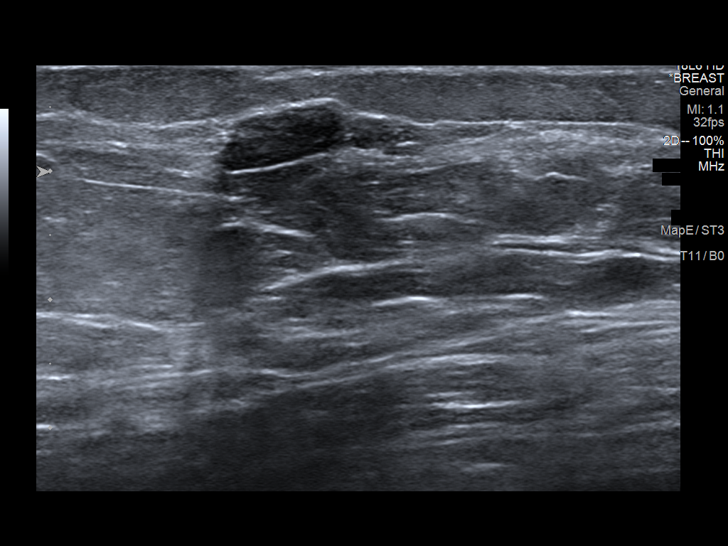
[im 14/21]
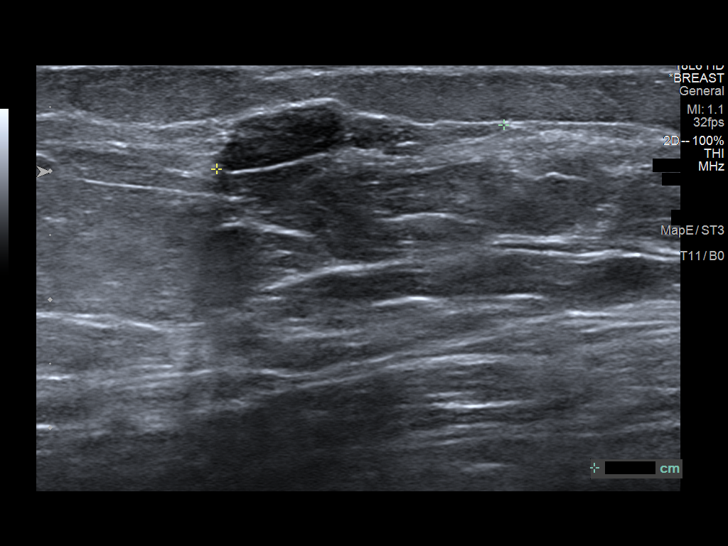
[im 16/21]
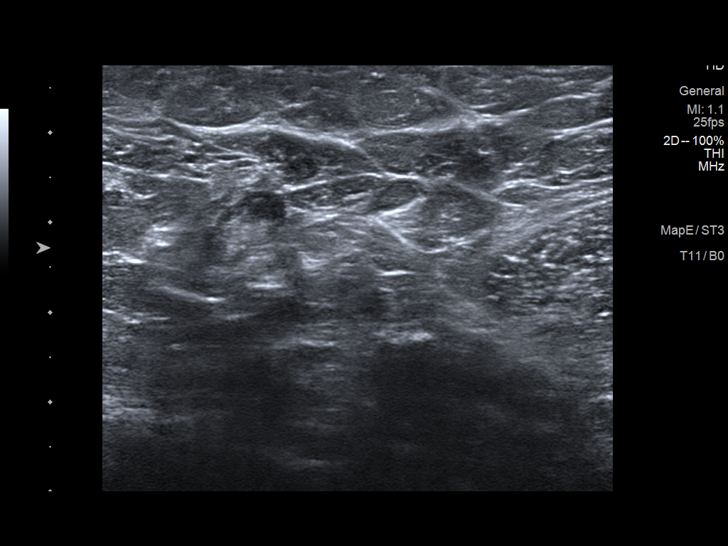
[im 17/21]
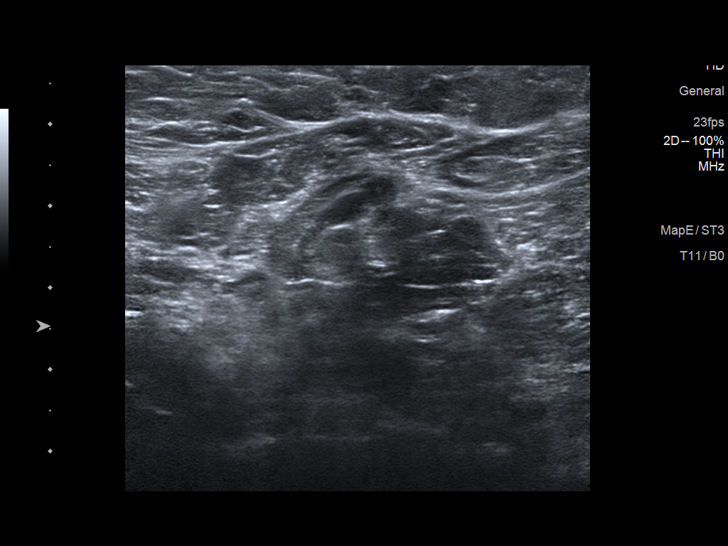
[im 19/21]
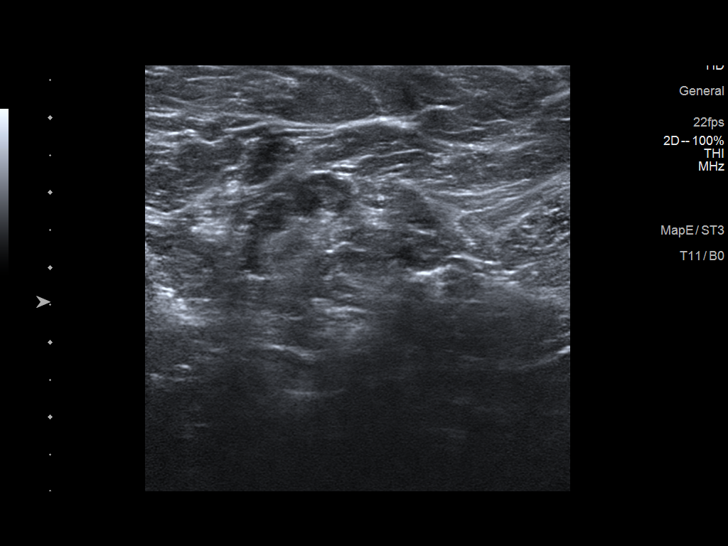
[im 21/21]
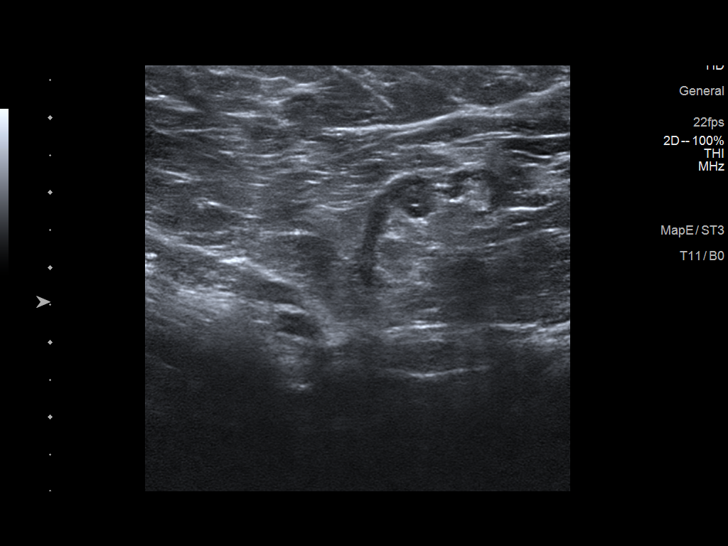

[13 of 21 positions shown; findings below may reference images not displayed]

ACR Breast Density Category b: There are scattered areas of
fibroglandular density.
FINDINGS: Two adjacent circumscribed, slightly irregular masses are identified
in the far medial right breast at middle depth. Further evaluation
with ultrasound was performed.

No additional suspicious findings are identified in the remainder of
either breast.

Mammographic images were processed with CAD.

Targeted ultrasound is performed, showing 2 oval, circumscribed
hypoechoic masses with slightly irregular margins at the 3 o'clock
position 2 cm from the nipple. They measure 1.1 x 0.9 x 0.5 cm and
1.3 x 0.8 x 0.6 cm. There is no internal vascularity. These
correlate well with the mammographic finding.

Evaluation of the right axilla demonstrates no suspicious
lymphadenopathy.
IMPRESSION: 1. Two indeterminate right breast masses at the 3 o'clock position
12 cm from the nipple. Recommendation is for ultrasound-guided
biopsy of both masses.
2. No suspicious right axillary lymphadenopathy.
3. No mammographic evidence of malignancy on the left.

RECOMMENDATION:
Two area ultrasound-guided biopsy of the right breast.

I have discussed the findings and recommendations with the patient.
If applicable, a reminder letter will be sent to the patient
regarding the next appointment.

BI-RADS CATEGORY  4: Suspicious.

## 2022-02-26 ENCOUNTER — Telehealth: Payer: Self-pay | Admitting: Adult Health

## 2022-02-26 NOTE — Telephone Encounter (Signed)
Pt aware that that I showed Dr Elonda Husky CT done at [facility], [place] in 1 year

## 2022-02-28 NOTE — Progress Notes (Unsigned)
03/03/2022 Teresa Bright [id number] 08/04/1985  Referring provider: Inda Coke, PA Primary GI doctor: Dr. Lyndel Safe  ASSESSMENT AND PLAN:   Assessment: 36 y.o. female here for assessment of the following: 1. Gastroesophageal reflux disease, unspecified whether esophagitis present   2. Loose stools   3. Rectal bleeding   4. Class 2 obesity due to excess calories without serious comorbidity with body mass index (BMI) of 37.0 to 37.9 in adult    Has had some specks of black and BRB in the stools in the last 2-3 weeks, no improvement in symptoms with medications, very mild elevation of ESR, normal CRP. After discussion with the patient she would like to proceed with endoscopic evaluation,  I think this is reasonable.  Negative celiac, negative H pylori.  Body mass index is 41.37 kg/m.   Plan: Continue PPI daily We have discussed the risks of bleeding, infection, perforation, medication reactions, and remote risk of death associated with colonoscopy. All questions were answered and the patient acknowledges these risk and wishes to proceed. -Patient has been advised to make an attempt to improve diet and exercise patterns to aid in weight loss.   Meds ordered this encounter  Medications   omeprazole (PRILOSEC) 40 MG capsule    Sig: Take 1 capsule (40 mg total) by mouth daily before breakfast.    Dispense:  90 capsule    Refill:  0       Patient Care Team: Inda Coke, [place] as PCP - General (Physician Assistant)  HISTORY OF PRESENT ILLNESS: 36 y.o. female with a past medical history of insulin resistance, morbid obesity, abdomen D deficiency, ovarian cyst and others listed below presents for evaluation of GERD, excessive bowel movements, and abdominal pain.   12/18/2021 office visit with myself for GERD, abdominal pain and frequent bowel movements. Patient had negative celiac, negative H. pylori, CRP negative sed rate 36. Patient told to add on Benefiber, avoid  lactulose given FODMAP diet.  Given Prilosec 40 mg daily.  Consider endoscopic evaluation.  Having some help with beano and lactate tablets that helps.  She has been on the pantoprazole and continues to have GERD.  She states she has had some black specs in her stools, no iron, no pepto. She has noticed BRB several weeks ago, in the poop a few weeks ago as well. Small volume.   She has alternating loose stools and formed stools, occ can feel she has a hard time getting the stool out.  She reports nausea intermittent, has had some improved with the PPI, worse if she does not take it  without vomiting.  She denies nocturnal symptoms.   She reports NSAID use, Ibuprofen 2 pills once a week.  She denies ETOH use.   She denies family history of gallbladder issues.    Current Medications:        Current Outpatient Medications (Other):    busPIRone (BUSPAR) 5 MG tablet, Take 1 tablet (5 mg total) by mouth 3 (three) times daily.   Cholecalciferol (VITAMIN D3 PO), Take 1,000 mg by mouth.   omeprazole (PRILOSEC) 40 MG capsule, Take 1 capsule (40 mg total) by mouth daily before breakfast.  Medical History:  Past Medical History:  Diagnosis Date   GERD (gastroesophageal reflux disease)    History of chickenpox    Migraine    Pregnant 08/20/2014   Allergies: No Known Allergies   Surgical History:  She  has a past surgical history that includes Wisdom tooth extraction; Oophorectomy (Right);  cysterectomy; Laparoscopic ovarian cystectomy (Right, 03/03/2016); and Breast reduction surgery (Bilateral, 10/09/2020). Family History:  Her family history includes Aneurysm in her maternal grandfather; Anxiety disorder in her mother; Breast cancer in her paternal grandmother; Cancer in her maternal grandmother and paternal grandmother; Dementia in her maternal grandmother; Diabetes in her maternal grandmother; Healthy in her brother and father; Hyperlipidemia in her mother; Hypertension in her mother;  Mental illness in her maternal uncle; Stroke in her maternal grandfather and maternal uncle. Social History:   reports that she has never smoked. She has never used smokeless tobacco. She reports current alcohol use. She reports that she does not use drugs.  REVIEW OF SYSTEMS  : All other systems reviewed and negative except where noted in the History of Present Illness.   PHYSICAL EXAM: BP 122/78   Pulse 97   Ht 5' 3.25" (1.607 m) Comment: wihtout shoes  Wt 235 lb 6 oz (106.8 kg)   LMP 02/04/2022   BMI 41.37 kg/m  General:   Pleasant, well developed female in no acute distress Heart:   regular rate and rhythm Pulm:  Clear anteriorly; no wheezing Abdomen:   Soft, Obese AB, Active bowel sounds. Mild RLQ tenderness, no rebound , No organomegaly appreciated. Rectal: Not evaluated Extremities:  Without edema. Msk: Symmetrical without gross deformities. Peripheral pulses intact.  Neurologic:  Alert and  oriented x4;  No focal deficits.  Skin:   Dry and intact without significant lesions or rashes. Psychiatric:  Cooperative. Normal mood and affect.    Vladimir Crofts, PA-C 3:04 PM

## 2022-03-02 ENCOUNTER — Encounter: Payer: Self-pay | Admitting: Obstetrics & Gynecology

## 2022-03-03 ENCOUNTER — Ambulatory Visit: Payer: Managed Care, Other (non HMO) | Admitting: Physician Assistant

## 2022-03-03 ENCOUNTER — Encounter: Payer: Self-pay | Admitting: Physician Assistant

## 2022-03-03 VITALS — BP 122/78 | HR 97 | Ht 63.25 in | Wt 235.4 lb

## 2022-03-03 DIAGNOSIS — R195 Other fecal abnormalities: Secondary | ICD-10-CM

## 2022-03-03 DIAGNOSIS — K625 Hemorrhage of anus and rectum: Secondary | ICD-10-CM | POA: Diagnosis not present

## 2022-03-03 DIAGNOSIS — K219 Gastro-esophageal reflux disease without esophagitis: Secondary | ICD-10-CM | POA: Diagnosis not present

## 2022-03-03 DIAGNOSIS — E6609 Other obesity due to excess calories: Secondary | ICD-10-CM | POA: Diagnosis not present

## 2022-03-03 DIAGNOSIS — Z6837 Body mass index (BMI) 37.0-37.9, adult: Secondary | ICD-10-CM

## 2022-03-03 MED ORDER — OMEPRAZOLE 40 MG PO CPDR: 40.0000 mg | DELAYED_RELEASE_CAPSULE | Freq: Every day | ORAL | 0 refills | Status: DC

## 2022-03-03 MED ORDER — NA SULFATE-K SULFATE-MG SULF 17.5-3.13-1.6 GM/177ML PO SOLN: 1.0000 | Freq: Once | ORAL | 0 refills | Status: AC

## 2022-03-03 NOTE — Patient Instructions (Addendum)
First do a trial off milk/lactose products if you use them.  Add fiber like benefiber or citracel once a day Increase activity Can do trial of IBGard which is over the counter for AB pain- Take 1-2 capsules once a day for maintence or twice a day during a flare  You have been scheduled for an endoscopy and colonoscopy. Please follow the written instructions given to you at your visit today. Please pick up your prep supplies at the pharmacy within the next 1-3 days. If you use inhalers (even only as needed), please bring them with you on the day of your procedure.   Please try low FODMAP diet- see below- start with eliminating just one column at a time, the table at the very bottom contains foods that are safe to take   FODMAP stands for fermentable oligo-, di-, mono-saccharides and polyols (1). These are the scientific terms used to classify groups of carbs that are notorious for triggering digestive symptoms like bloating, gas and stomach pain.    Due to recent changes in healthcare laws, you may see the results of your imaging and laboratory studies on MyChart before your provider has had a chance to review them.  We understand that in some cases there may be results that are confusing or concerning to you. Not all laboratory results come back in the same time frame and the provider may be waiting for multiple results in order to interpret others.  Please give [place] 48 hours in order for your provider to thoroughly review all the results before contacting the office for clarification of your results.   The [facility] providers would like to encourage you to use [facility] to communicate with providers for non-urgent requests or questions.  Due to long hold times on the telephone, sending your provider a message by [facility] may be a faster and more efficient way to get a response.  Please allow 48 business hours for a response.  Please remember that this is for non-urgent requests.

## 2022-03-05 IMAGING — MG MM BREAST LOCALIZATION CLIP
4 series · 4 of 12 positions shown · non-contrast
Comparison: Previous exam(s).

CLINICAL DATA: Patient status post ultrasound-guided core needle
biopsy 2 adjacent right breast masses 3 o'clock position.

EXAM:
DIAGNOSTIC RIGHT MAMMOGRAM POST ULTRASOUND BIOPSY

[R ML synth-2D]
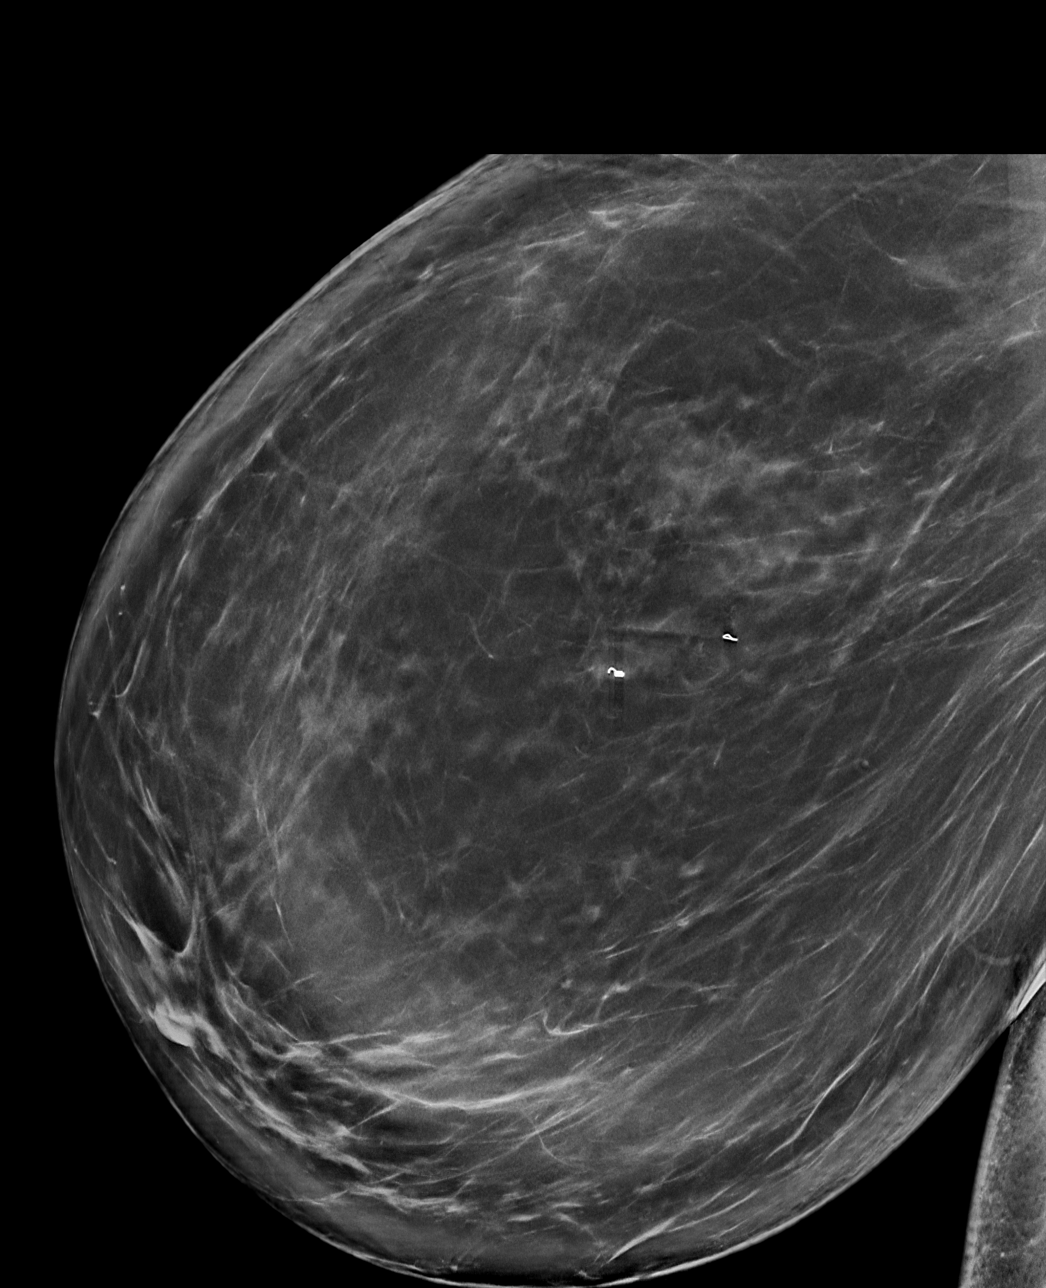

[R CC synth-2D]
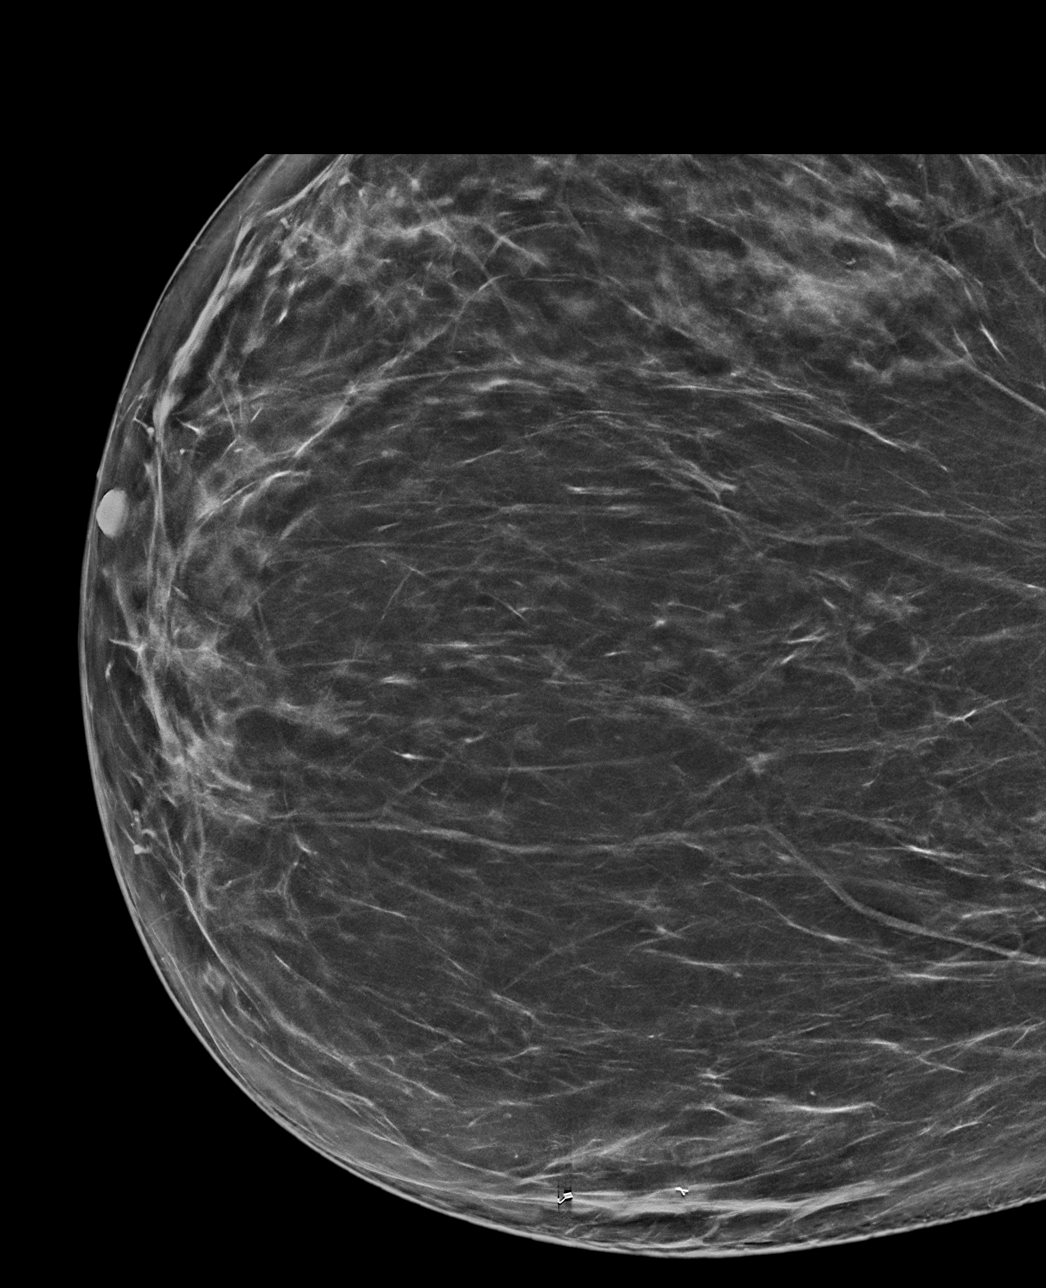

[R CC tomo · tomo slice 44/87.0]
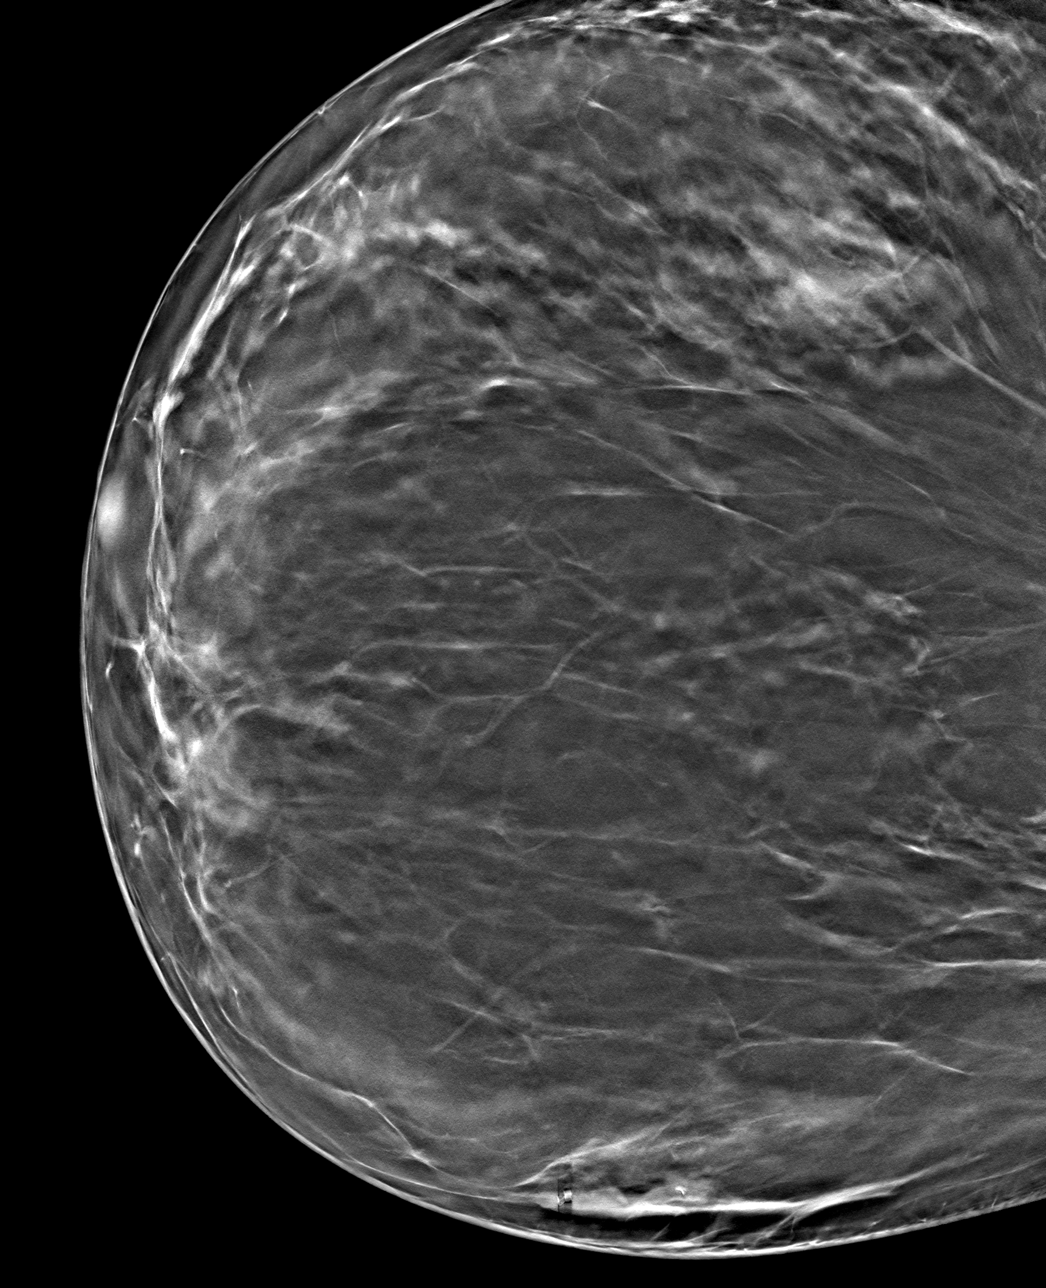

[R ML tomo · tomo slice 53/106.0]
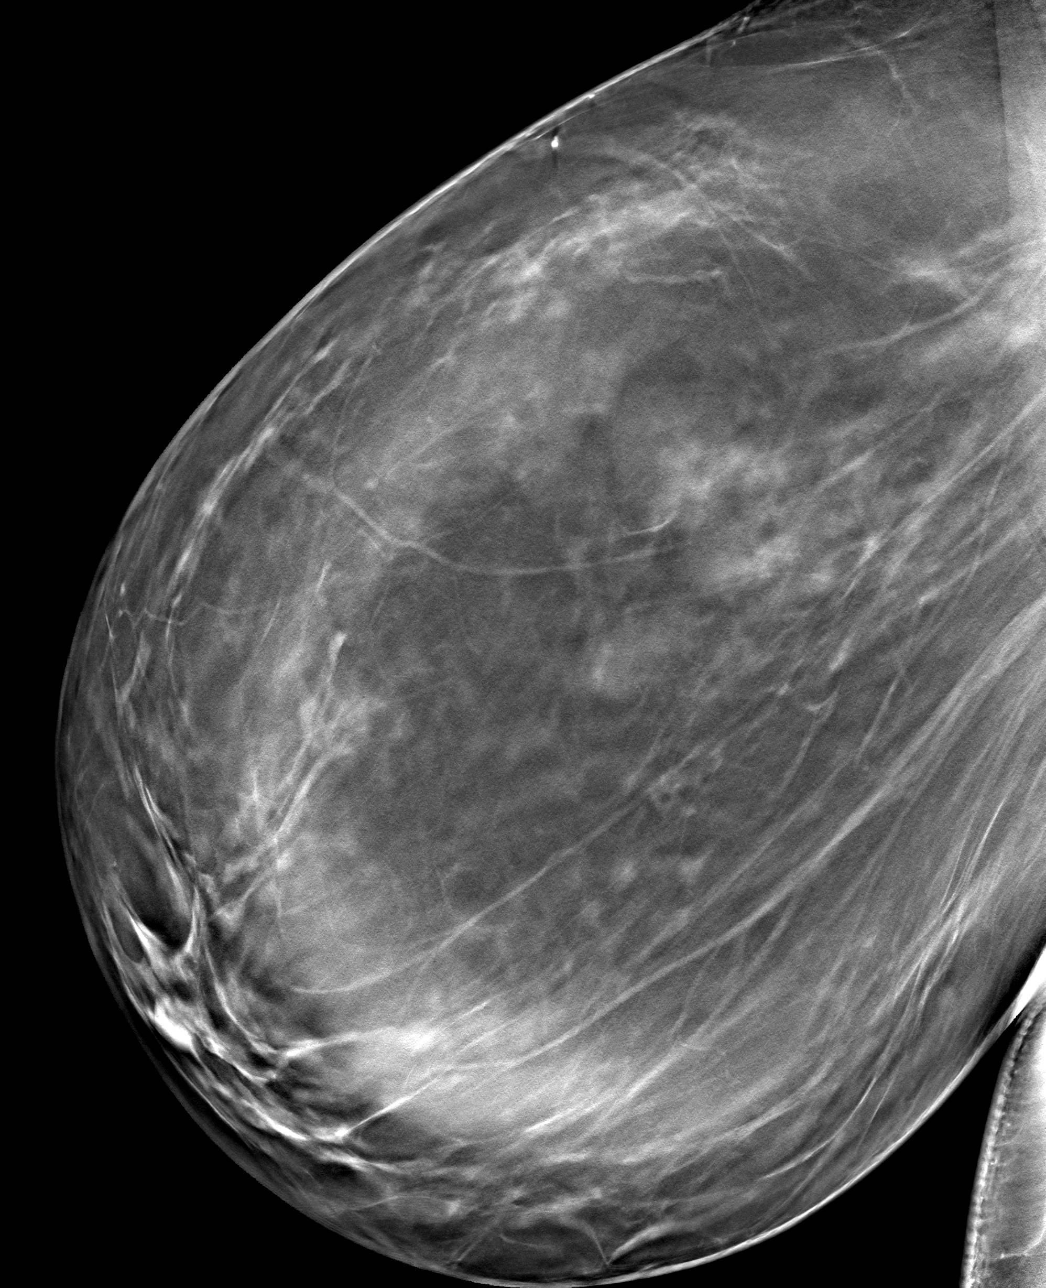

[4 of 12 positions shown; findings below may reference images not displayed]

FINDINGS: Mammographic images were obtained following ultrasound guided biopsy
of two adjacent right breast masses 3 o'clock position:

Site 1: Right breast mass 3 o'clock position 12 cm from the nipple:
Ribbon shaped clip: In appropriate position.

Site 2: Right breast mass 3 o'clock position 12 cm from nipple: Coil
shaped clip: In appropriate position.
IMPRESSION: Appropriate position biopsy marking clip status post
ultrasound-guided core needle biopsy two adjacent right breast
masses 3 o'clock position.

Final Assessment: Post Procedure Mammograms for Marker Placement

## 2022-03-15 ENCOUNTER — Encounter: Payer: Self-pay | Admitting: *Deleted

## 2022-03-17 NOTE — Progress Notes (Signed)
Agree with assessment/plan.  Raj Alek Poncedeleon, MD [facility] [phone]  

## 2022-04-01 ENCOUNTER — Telehealth: Payer: Self-pay | Admitting: Physician Assistant

## 2022-04-01 MED ORDER — NA SULFATE-K SULFATE-MG SULF 17.5-3.13-1.6 GM/177ML PO SOLN: 1.0000 | Freq: Once | ORAL | 0 refills | Status: AC

## 2022-04-01 NOTE — Telephone Encounter (Signed)
Patient called states [facility] does not have her prep medication order please complete and send. She asked for a call once done so she knows she can go again.

## 2022-04-01 NOTE — Telephone Encounter (Signed)
Left message for patient to return my call.

## 2022-04-01 NOTE — Telephone Encounter (Signed)
Clarified prescription needs to be sent to [facility] in [place]. Prescription for Suprep resent to pharmacy.

## 2022-04-07 ENCOUNTER — Encounter: Payer: Self-pay | Admitting: Gastroenterology

## 2022-04-07 ENCOUNTER — Ambulatory Visit: Payer: Managed Care, Other (non HMO) | Admitting: Gastroenterology

## 2022-04-07 VITALS — BP 136/85 | HR 80 | Temp 98.2°F | Resp 19 | Ht 63.0 in | Wt 235.0 lb

## 2022-04-07 DIAGNOSIS — K219 Gastro-esophageal reflux disease without esophagitis: Secondary | ICD-10-CM

## 2022-04-07 DIAGNOSIS — K317 Polyp of stomach and duodenum: Secondary | ICD-10-CM | POA: Diagnosis not present

## 2022-04-07 DIAGNOSIS — K625 Hemorrhage of anus and rectum: Secondary | ICD-10-CM

## 2022-04-07 DIAGNOSIS — R197 Diarrhea, unspecified: Secondary | ICD-10-CM

## 2022-04-07 DIAGNOSIS — K649 Unspecified hemorrhoids: Secondary | ICD-10-CM | POA: Diagnosis not present

## 2022-04-07 MED ORDER — SODIUM CHLORIDE 0.9 % IV SOLN: 500.0000 mL | Freq: Once | INTRAVENOUS | Status: DC

## 2022-04-07 NOTE — Patient Instructions (Addendum)
Await pathology results from the biopsies taken today.  Repeat colonoscopy in 10 years for screening purposes.   Use preparation H twice daily after bowel movements for 7-10 days as needed.  Resume previous diet and medications. Can try reducing omeprazole to every other day.    Avoid NSAIDS (Aspirin, Ibuprofen, Aleve, Naproxen), for 5 days you may use Tylenol as needed.  YOU HAD AN ENDOSCOPIC PROCEDURE TODAY AT [facility]:   Refer to the procedure report that was given to you for any specific questions about what was found during the examination.  If the procedure report does not answer your questions, please call your gastroenterologist to clarify.  If you requested that your care partner not be given the details of your procedure findings, then the procedure report has been included in a sealed envelope for you to review at your convenience later.  YOU SHOULD EXPECT: Some feelings of bloating in the abdomen. Passage of more gas than usual.  Walking can help get rid of the air that was put into your GI tract during the procedure and reduce the bloating. If you had a lower endoscopy (such as a colonoscopy or flexible sigmoidoscopy) you may notice spotting of blood in your stool or on the toilet paper. If you underwent a bowel prep for your procedure, you may not have a normal bowel movement for a few days.  Please Note:  You might notice some irritation and congestion in your nose or some drainage.  This is from the oxygen used during your procedure.  There is no need for concern and it should clear up in a day or so.  SYMPTOMS TO REPORT IMMEDIATELY:  Following lower endoscopy (colonoscopy or flexible sigmoidoscopy):  Excessive amounts of blood in the stool  Significant tenderness or worsening of abdominal pains  Swelling of the abdomen that is new, acute  Fever of 100F or higher  Following upper endoscopy (EGD)  Vomiting of blood or coffee ground material  New chest  pain or pain under the shoulder blades  Painful or persistently difficult swallowing  New shortness of breath  Fever of 100F or higher  Black, tarry-looking stools  For urgent or emergent issues, a gastroenterologist can be reached at any hour by calling [phone]. Do not use MyChart messaging for urgent concerns.    DIET:  We do recommend a small meal at first, but then you may proceed to your regular diet.  Drink plenty of fluids but you should avoid alcoholic beverages for 24 hours.  ACTIVITY:  You should plan to take it easy for the rest of today and you should NOT DRIVE or use heavy machinery until tomorrow (because of the sedation medicines used during the test).    FOLLOW UP: Our staff will call the number listed on your records the next business day following your procedure.  We will call around 7:15- 8:00 am to check on you and address any questions or concerns that you may have regarding the information given to you following your procedure. If we do not reach you, we will leave a message.     If any biopsies were taken you will be contacted by phone or by letter within the next 1-3 weeks.  Please call us at [phone] if you have not heard about the biopsies in 3 weeks.    SIGNATURES/CONFIDENTIALITY: You and/or your care partner have signed paperwork which will be entered into your electronic medical record.  These signatures attest to  the fact that that the information above on your After Visit Summary has been reviewed and is understood.  Full responsibility of the confidentiality of this discharge information lies with you and/or your care-partner.

## 2022-04-07 NOTE — Op Note (Signed)
[place] Patient Name: Teresa Bright Procedure Date: 04/07/2022 1:43 PM MRN: [id number] Endoscopist: Jackquline Denmark , MD Age: 36 Referring MD:  Date of Birth: 01-21-1986 Gender: Female Account #: [id number] Procedure:                Upper GI endoscopy Indications:              Heartburn Medicines:                Monitored Anesthesia Care Procedure:                Pre-Anesthesia Assessment:                           - Prior to the procedure, a History and Physical                            was performed, and patient medications and                            allergies were reviewed. The patient's tolerance of                            previous anesthesia was also reviewed. The risks                            and benefits of the procedure and the sedation                            options and risks were discussed with the patient.                            All questions were answered, and informed consent                            was obtained. Prior Anticoagulants: The patient has                            taken no previous anticoagulant or antiplatelet                            agents. ASA Grade Assessment: II - A patient with                            mild systemic disease. After reviewing the risks                            and benefits, the patient was deemed in                            satisfactory condition to undergo the procedure.                           After obtaining informed consent, the endoscope was  passed under direct vision. Throughout the                            procedure, the patient's blood pressure, pulse, and                            oxygen saturations were monitored continuously. The                            GIF HQ190 #[id number] was introduced through the                            mouth, and advanced to the second part of duodenum.                            The upper GI endoscopy was accomplished  without                            difficulty. The patient tolerated the procedure                            well. Scope In: Scope Out: Findings:                 The examined esophagus was normal with well-defined                            Z-line at 32 cm, examined by NBI. The GE junction                            was somewhat patulous. No hiatal hernias were                            noted. Biopsies were obtained from the proximal and                            distal esophagus with cold forceps for histology of                            suspected eosinophilic esophagitis.                           Multiple (15-20) 6 to 10 mm semi-sessile polyps                            with no stigmata of recent bleeding were found in                            the gastric body. Three polyps were removed with a                            hot snare. Resection and retrieval (via Jabier Mutton  basket) were complete. Multiple biopsies obtained                            from the antrum and body to rule out H. pylori.                           The examined duodenum was normal. Biopsies for                            histology were taken with a cold forceps for                            evaluation of celiac disease. Complications:            No immediate complications. Estimated Blood Loss:     Estimated blood loss: none. Impression:               - Normal esophagus. Biopsied.                           - Multiple gastric polyps. Resected and retrieved x                            3.                           - Normal examined duodenum. Biopsied. Recommendation:           - Patient has a contact number available for                            emergencies. The signs and symptoms of potential                            delayed complications were discussed with the                            patient. Return to normal activities tomorrow.                            Written discharge  instructions were provided to the                            patient.                           - Resume previous diet.                           - Continue present medications. Can try reducing                            omeprazole to every other day.                           - Nonpharmacologic means of reflux control.                           -  Await pathology results.                           - No aspirin, ibuprofen, naproxen, or other                            non-steroidal anti-inflammatory drugs for 5 days                            after polyp removal.                           - The findings and recommendations were discussed                            with the patient's family. Jackquline Denmark, MD 04/07/2022 2:25:14 PM This report has been signed electronically.

## 2022-04-07 NOTE — Op Note (Signed)
[place] Patient Name: Teresa Bright Procedure Date: 04/07/2022 1:43 PM MRN: [id number] Endoscopist: Jackquline Denmark , MD Age: 36 Referring MD:  Date of Birth: 12/24/1985 Gender: Female Account #: [id number] Procedure:                Colonoscopy Indications:              Rectal bleeding. Intermittent diarrhea Medicines:                Monitored Anesthesia Care Procedure:                Pre-Anesthesia Assessment:                           - Prior to the procedure, a History and Physical                            was performed, and patient medications and                            allergies were reviewed. The patient's tolerance of                            previous anesthesia was also reviewed. The risks                            and benefits of the procedure and the sedation                            options and risks were discussed with the patient.                            All questions were answered, and informed consent                            was obtained. Prior Anticoagulants: The patient has                            taken no previous anticoagulant or antiplatelet                            agents. ASA Grade Assessment: II - A patient with                            mild systemic disease. After reviewing the risks                            and benefits, the patient was deemed in                            satisfactory condition to undergo the procedure.                           After obtaining informed consent, the colonoscope  was passed under direct vision. Throughout the                            procedure, the patient's blood pressure, pulse, and                            oxygen saturations were monitored continuously. The                            [id number]-HQ190L Colonoscope was introduced through the                            anus and advanced to the 2 cm into the ileum. The                            colonoscopy was  performed without difficulty. The                            patient tolerated the procedure well. The quality                            of the bowel preparation was good. The terminal                            ileum, ileocecal valve, appendiceal orifice, and                            rectum were photographed. Scope In: 1:59:16 PM Scope Out: 2:11:00 PM Scope Withdrawal Time: 0 hours 9 minutes 2 seconds  Total Procedure Duration: 0 hours 11 minutes 44 seconds  Findings:                 The colon (entire examined portion) appeared                            normal. Biopsies for histology were taken with a                            cold forceps from the entire colon for evaluation                            of microscopic colitis.                           Non-bleeding internal hemorrhoids were found during                            retroflexion and during perianal exam. The                            hemorrhoids were small and Grade I (internal                            hemorrhoids that do not prolapse).  The terminal ileum appeared normal.                           The exam was otherwise without abnormality on                            direct and retroflexion views. Complications:            No immediate complications. Estimated Blood Loss:     Estimated blood loss: none. Impression:               - The entire examined colon is normal. Biopsied.                           - Non-bleeding internal hemorrhoids.                           - The examined portion of the ileum was normal.                           - The examination was otherwise normal on direct                            and retroflexion views. Recommendation:           - Patient has a contact number available for                            emergencies. The signs and symptoms of potential                            delayed complications were discussed with the                            patient.  Return to normal activities tomorrow.                            Written discharge instructions were provided to the                            patient.                           - Resume previous diet.                           - Continue present medications.                           - Await pathology results.                           - Repeat colonoscopy in 10 years for screening                            purposes. Earlier, if with any new problems or  change in family history.                           - Use Preparation H 1 twice daily after the bowel                            movement for 7 to 10 days as needed. If still with                            problems, let us know.                           - The findings and recommendations were discussed                            with the patient's family. Jackquline Denmark, MD 04/07/2022 2:15:47 PM This report has been signed electronically.

## 2022-04-07 NOTE — Progress Notes (Signed)
Called to room to assist during endoscopic procedure.  Patient ID and intended procedure confirmed with present staff. Received instructions for my participation in the procedure from the performing physician.  

## 2022-04-07 NOTE — Progress Notes (Signed)
VSS, transported to PACU °

## 2022-04-07 NOTE — Progress Notes (Signed)
03/03/2022 Teresa Bright [id number] Nov 14, 1985   Referring provider: Inda Coke, PA Primary GI doctor: Dr. Lyndel Safe   ASSESSMENT AND PLAN:    Assessment: 36 y.o. female here for assessment of the following: 1. Gastroesophageal reflux disease, unspecified whether esophagitis present   2. Loose stools   3. Rectal bleeding   4. Class 2 obesity due to excess calories without serious comorbidity with body mass index (BMI) of 37.0 to 37.9 in adult     Has had some specks of black and BRB in the stools in the last 2-3 weeks, no improvement in symptoms with medications, very mild elevation of ESR, normal CRP. After discussion with the patient she would like to proceed with endoscopic evaluation,  I think this is reasonable.  Negative celiac, negative H pylori.  Body mass index is 41.37 kg/m.    Plan: Continue PPI daily We have discussed the risks of bleeding, infection, perforation, medication reactions, and remote risk of death associated with colonoscopy. All questions were answered and the patient acknowledges these risk and wishes to proceed. -Patient has been advised to make an attempt to improve diet and exercise patterns to aid in weight loss.         Meds ordered this encounter  Medications   omeprazole (PRILOSEC) 40 MG capsule      Sig: Take 1 capsule (40 mg total) by mouth daily before breakfast.      Dispense:  90 capsule      Refill:  0            Patient Care Team: Inda Coke, [place] as PCP - General (Physician Assistant)   HISTORY OF PRESENT ILLNESS: 36 y.o. female with a past medical history of insulin resistance, morbid obesity, abdomen D deficiency, ovarian cyst and others listed below presents for evaluation of GERD, excessive bowel movements, and abdominal pain.    12/18/2021 office visit with myself for GERD, abdominal pain and frequent bowel movements. Patient had negative celiac, negative H. pylori, CRP negative sed rate 36. Patient told to add  on Benefiber, avoid lactulose given FODMAP diet.  Given Prilosec 40 mg daily.  Consider endoscopic evaluation.   Having some help with beano and lactate tablets that helps.  She has been on the pantoprazole and continues to have GERD.  She states she has had some black specs in her stools, no iron, no pepto. She has noticed BRB several weeks ago, in the poop a few weeks ago as well. Small volume.   She has alternating loose stools and formed stools, occ can feel she has a hard time getting the stool out.  She reports nausea intermittent, has had some improved with the PPI, worse if she does not take it  without vomiting.  She denies nocturnal symptoms.   She reports NSAID use, Ibuprofen 2 pills once a week.  She denies ETOH use.   She denies family history of gallbladder issues.      Current Medications:              Current Outpatient Medications (Other):    busPIRone (BUSPAR) 5 MG tablet, Take 1 tablet (5 mg total) by mouth 3 (three) times daily.   Cholecalciferol (VITAMIN D3 PO), Take 1,000 mg by mouth.   omeprazole (PRILOSEC) 40 MG capsule, Take 1 capsule (40 mg total) by mouth daily before breakfast.   Medical History:      Past Medical History:  Diagnosis Date   GERD (gastroesophageal reflux disease)  History of chickenpox     Migraine     Pregnant 08/20/2014    Allergies: No Known Allergies    Surgical History:  She  has a past surgical history that includes Wisdom tooth extraction; Oophorectomy (Right); cysterectomy; Laparoscopic ovarian cystectomy (Right, 03/03/2016); and Breast reduction surgery (Bilateral, 10/09/2020). Family History:  Her family history includes Aneurysm in her maternal grandfather; Anxiety disorder in her mother; Breast cancer in her paternal grandmother; Cancer in her maternal grandmother and paternal grandmother; Dementia in her maternal grandmother; Diabetes in her maternal grandmother; Healthy in her brother and father; Hyperlipidemia in her  mother; Hypertension in her mother; Mental illness in her maternal uncle; Stroke in her maternal grandfather and maternal uncle. Social History:   reports that she has never smoked. She has never used smokeless tobacco. She reports current alcohol use. She reports that she does not use drugs.   REVIEW OF SYSTEMS  : All other systems reviewed and negative except where noted in the History of Present Illness.     PHYSICAL EXAM: BP 122/78   Pulse 97   Ht 5' 3.25" (1.607 m) Comment: wihtout shoes  Wt 235 lb 6 oz (106.8 kg)   LMP 02/04/2022   BMI 41.37 kg/m  General:   Pleasant, well developed female in no acute distress Heart:   regular rate and rhythm Pulm:  Clear anteriorly; no wheezing Abdomen:   Soft, Obese AB, Active bowel sounds. Mild RLQ tenderness, no rebound , No organomegaly appreciated. Rectal: Not evaluated Extremities:  Without edema. Msk: Symmetrical without gross deformities. Peripheral pulses intact.  Neurologic:  Alert and  oriented x4;  No focal deficits.  Skin:   Dry and intact without significant lesions or rashes. Psychiatric:  Cooperative. Normal mood and affect.       Vladimir Crofts, PA-C    Attending physician's note   I have taken history, reviewed the chart and examined the patient. I performed a substantive portion of this encounter, including complete performance of at least one of the key components, in conjunction with the APP. I agree with the Advanced Practitioner's note, impression and recommendations.    Carmell Austria, MD [facility] [phone]

## 2022-04-07 NOTE — Progress Notes (Signed)
Pt's states no medical or surgical changes since previsit or office visit. 

## 2022-04-08 ENCOUNTER — Telehealth: Payer: Self-pay

## 2022-04-08 NOTE — Telephone Encounter (Signed)
  Follow up Call-     04/07/2022   12:51 PM  Call back number  Post procedure Call Back phone  # [phone]  Permission to leave phone message Yes     Patient questions:  Do you have a fever, pain , or abdominal swelling? No. Pain Score  0 *  Have you tolerated food without any problems? Yes.    Have you been able to return to your normal activities? Yes.    Do you have any questions about your discharge instructions: Diet   No. Medications  No. Follow up visit  No.  Do you have questions or concerns about your Care? No.  Actions: * If pain score is 4 or above: No action needed, pain <4.

## 2022-04-18 ENCOUNTER — Encounter: Payer: Self-pay | Admitting: Gastroenterology

## 2022-04-19 ENCOUNTER — Ambulatory Visit: Payer: Managed Care, Other (non HMO) | Admitting: Adult Health

## 2022-04-21 ENCOUNTER — Ambulatory Visit: Payer: Managed Care, Other (non HMO) | Admitting: Adult Health

## 2022-04-21 ENCOUNTER — Encounter: Payer: Self-pay | Admitting: Adult Health

## 2022-04-21 VITALS — BP 135/98 | HR 77 | Ht 64.0 in | Wt 236.5 lb

## 2022-04-21 DIAGNOSIS — F419 Anxiety disorder, unspecified: Secondary | ICD-10-CM | POA: Diagnosis not present

## 2022-04-21 MED ORDER — BUSPIRONE HCL 7.5 MG PO TABS: 7.5000 mg | ORAL_TABLET | Freq: Three times a day (TID) | ORAL | 6 refills | Status: DC

## 2022-04-21 NOTE — Progress Notes (Signed)
  Subjective:     Patient ID: Teresa Bright, female   DOB: Apr 05, 1986, 36 y.o.   MRN: [id number]  HPI Teresa Bright is a 36 year old white female, married, G2P1001, back in follow up after starting Buspar 5 mg tid for anxiety and feels better.  Last pap was negative HPV and Malignancy, 02/17/22.  PCP is Gwynneth Munson, PA  Review of Systems Feels better,not as anxious Reviewed past medical,surgical, social and family history. Reviewed medications and allergies.     Objective:   Physical Exam BP (!) 135/98 (BP Location: Left Arm, Patient Position: Sitting, Cuff Size: Normal)   Pulse 77   Ht '5\' 4"'$  (1.626 m)   Wt 236 lb 8 oz (107.3 kg)   LMP 04/09/2022 (Approximate)   BMI 40.60 kg/m     Skin warm and dry. Lungs: clear to ausculation bilaterally. Cardiovascular: regular rate and rhythm.  Fall risk is low  Upstream - 04/21/22 1009       Pregnancy Intention Screening   Does the patient want to become pregnant in the next year? Ok Either Way    Does the patient's partner want to become pregnant in the next year? Ok Either Way    Would the patient like to discuss contraceptive options today? No      Contraception Wrap Up   Current Method Female Condom    End Method Female Condom                04/21/2022   10:11 AM 02/17/2022   10:04 AM 12/08/2021    8:19 AM  Depression screen PHQ 2/9  Decreased Interest 0 0 0  Down, Depressed, Hopeless 0 0 0  PHQ - 2 Score 0 0 0  Altered sleeping 1 2   Tired, decreased energy 1 3   Change in appetite 1 0   Feeling bad or failure about yourself  0 0   Trouble concentrating 1 1   Moving slowly or fidgety/restless 0 0   Suicidal thoughts 0 0   PHQ-9 Score 4 6   Difficult doing work/chores Somewhat difficult         04/21/2022   10:12 AM 02/17/2022   10:04 AM 12/08/2021    9:12 AM  GAD 7 : Generalized Anxiety Score  Nervous, Anxious, on Edge '1 1 2  '$ Control/stop worrying 0 1 2  Worry too much - different things '1 1 2  '$ Trouble relaxing '1 1 3   '$ Restless 0 0 0  Easily annoyed or irritable '1 2 2  '$ Afraid - awful might happen 0 1 2  Total GAD 7 Score '4 7 13  '$ Anxiety Difficulty Somewhat difficult  Somewhat difficult     Assessment:     1. Anxiety Feels better on Buspar But will increase to 7.5 mg 1 tid Meds ordered this encounter  Medications   busPIRone (BUSPAR) 7.5 MG tablet    Sig: Take 1 tablet (7.5 mg total) by mouth 3 (three) times daily.    Dispense:  90 tablet    Refill:  6    Order Specific Question:   Supervising Provider    Answer:   Florian Buff [[id number]]       Plan:     Follow up in 4 months or sooner if needed

## 2022-06-03 ENCOUNTER — Encounter: Payer: Self-pay | Admitting: *Deleted

## 2022-06-08 ENCOUNTER — Other Ambulatory Visit: Payer: Self-pay | Admitting: Surgical

## 2022-06-08 ENCOUNTER — Telehealth: Payer: Self-pay

## 2022-06-08 ENCOUNTER — Ambulatory Visit: Payer: Managed Care, Other (non HMO) | Admitting: Surgical

## 2022-06-08 DIAGNOSIS — N6320 Unspecified lump in the left breast, unspecified quadrant: Secondary | ICD-10-CM

## 2022-06-08 DIAGNOSIS — N6323 Unspecified lump in the left breast, lower outer quadrant: Secondary | ICD-10-CM | POA: Diagnosis not present

## 2022-06-08 DIAGNOSIS — Z9889 Other specified postprocedural states: Secondary | ICD-10-CM

## 2022-06-08 DIAGNOSIS — N62 Hypertrophy of breast: Secondary | ICD-10-CM

## 2022-06-08 NOTE — Telephone Encounter (Signed)
Patient has appointment at [facility] on 2/20 at 7:40 for US of the left breast and BL digital MMG. On waiting list to get in sooner with Dr. Marla Roe to discuss results

## 2022-06-08 NOTE — Progress Notes (Addendum)
   Referring Provider Inda Coke, [address] [place],  [place] [address]   CC:  Chief Complaint  Patient presents with   Follow-up      Teresa Bright is an 36 y.o. female.  HPI: Patient is a 36 year old female here for follow-up after bilateral breast reduction on 10/09/2020 with Dr. Marla Roe.  She is 1 year and 8 months postop, reports that she has had some firmness in the left breast just adjacent to the nipple areola, reports she has been massaging this area but has had minimal improvement.  She denies any nipple areolar discharge.  She reports no personal history of breast cancer, reports family history of breast cancer: Paternal grandmother.  She reports that the left breast is the breast that postoperatively had some concerns for nipple areolar necrosis.  She reports the area is tender with palpation or when wearing compressive garments.   Review of Systems General: No fevers or chills  Physical Exam    04/21/2022   10:06 AM 04/07/2022    2:35 PM 04/07/2022    2:25 PM  Vitals with BMI  Height '5\' 4"'$     Weight 236 lbs 8 oz    BMI 02.40    Systolic 973 532 992  Diastolic 98 85 82  Pulse 77 80 86    General:  No acute distress,  Alert and oriented, Non-Toxic, Normal speech and affect Breast: Bilateral breast incisions are intact,There is no erythema or cellulitic changes of either breast.  No subcutaneous fluid collections.  The left breast has 2 firm areas, one 6 x 3 cm from approximately 1-4 o'clock just adjacent to the NAC.  She has another area inferior to this that is 3 x 3 cm.  The area is firm, tender to palpation.  No overlying skin changes.  Assessment/Plan 36 year old female status post bilateral breast reduction with Dr. Marla Roe 1 year and 8 months ago.  She has healed well, however presents today with concerns of firmness to the left lateral breast, tenderness with palpation.    Given that she had a breast reduction and the length of time the  firmness has been present (approximately 1 year), I suspect that this is likely fat necrosis that is to large to be dissolved and may need to go undergo excision in the operating room or office pending provider and patient preference.  However, lets plan for ultrasound to evaluate the area.  Teresa Bright 06/08/2022, 3:14 PM

## 2022-06-08 NOTE — Addendum Note (Signed)
Addended byRoetta Sessions on: 06/08/2022 04:08 PM   Modules accepted: Orders

## 2022-06-09 ENCOUNTER — Encounter: Payer: Self-pay | Admitting: Surgical

## 2022-06-16 NOTE — Telephone Encounter (Signed)
Close  

## 2022-07-09 ENCOUNTER — Ambulatory Visit: Payer: Managed Care, Other (non HMO) | Admitting: Plastic Surgery

## 2022-08-10 ENCOUNTER — Ambulatory Visit
Admission: RE | Admit: 2022-08-10 | Discharge: 2022-08-10 | Disposition: A | Discharge status: Home / Self Care | Payer: Managed Care, Other (non HMO) | Source: Ambulatory Visit | Attending: Surgical | Admitting: Surgical

## 2022-08-10 ENCOUNTER — Other Ambulatory Visit: Payer: Self-pay | Admitting: Surgical

## 2022-08-10 DIAGNOSIS — N6489 Other specified disorders of breast: Secondary | ICD-10-CM

## 2022-08-10 DIAGNOSIS — Z9889 Other specified postprocedural states: Secondary | ICD-10-CM

## 2022-08-10 DIAGNOSIS — N62 Hypertrophy of breast: Secondary | ICD-10-CM

## 2022-08-10 DIAGNOSIS — N6310 Unspecified lump in the right breast, unspecified quadrant: Secondary | ICD-10-CM

## 2022-08-10 DIAGNOSIS — N6320 Unspecified lump in the left breast, unspecified quadrant: Secondary | ICD-10-CM

## 2022-08-19 ENCOUNTER — Ambulatory Visit
Admission: RE | Admit: 2022-08-19 | Discharge: 2022-08-19 | Disposition: A | Discharge status: Home / Self Care | Payer: Managed Care, Other (non HMO) | Source: Ambulatory Visit | Attending: Surgical | Admitting: Surgical

## 2022-08-19 DIAGNOSIS — N6320 Unspecified lump in the left breast, unspecified quadrant: Secondary | ICD-10-CM

## 2022-08-19 DIAGNOSIS — N6310 Unspecified lump in the right breast, unspecified quadrant: Secondary | ICD-10-CM

## 2022-08-19 HISTORY — PX: BREAST BIOPSY: SHX20

## 2022-08-20 ENCOUNTER — Ambulatory Visit: Payer: Managed Care, Other (non HMO) | Admitting: Adult Health

## 2022-08-20 ENCOUNTER — Encounter: Payer: Self-pay | Admitting: Adult Health

## 2022-08-20 VITALS — BP 129/86 | HR 94 | Ht 64.0 in | Wt 237.8 lb

## 2022-08-20 DIAGNOSIS — F419 Anxiety disorder, unspecified: Secondary | ICD-10-CM

## 2022-08-20 MED ORDER — BUSPIRONE HCL 7.5 MG PO TABS: 7.5000 mg | ORAL_TABLET | Freq: Three times a day (TID) | ORAL | 12 refills | Status: DC

## 2022-08-20 NOTE — Progress Notes (Signed)
  Subjective:     Patient ID: Teresa Bright, female   DOB: 1986-01-25, 37 y.o.   MRN: [id number]  HPI Teresa Bright is a 37 year old white female, married, G2P1001, back in follow up on taking Buspar 7.5 mg 1 tid and doing good she says. She had breast biopsy yesterday.   Last pap was negative HPV,NILM 02/17/22  PCP is Gwynneth Munson PA   Review of Systems Feels less anxious on Buspar Reviewed past medical,surgical, social and family history. Reviewed medications and allergies.     Objective:   Physical Exam BP 129/86 (BP Location: Right Arm, Patient Position: Sitting, Cuff Size: Normal)   Pulse 94   Ht '5\' 4"'$  (1.626 m)   Wt 237 lb 12.8 oz (107.9 kg)   LMP 08/18/2022   BMI 40.82 kg/m      Skin warm and dry. Lungs: clear to ausculation bilaterally. Cardiovascular: regular rate and rhythm.     08/20/2022    8:51 AM 04/21/2022   10:11 AM 02/17/2022   10:04 AM  Depression screen PHQ 2/9  Decreased Interest 0 0 0  Down, Depressed, Hopeless 0 0 0  PHQ - 2 Score 0 0 0  Altered sleeping '2 1 2  '$ Tired, decreased energy '2 1 3  '$ Change in appetite 0 1 0  Feeling bad or failure about yourself  0 0 0  Trouble concentrating 0 1 1  Moving slowly or fidgety/restless 0 0 0  Suicidal thoughts 0 0 0  PHQ-9 Score '4 4 6  '$ Difficult doing work/chores  Somewhat difficult        08/20/2022    8:52 AM 04/21/2022   10:12 AM 02/17/2022   10:04 AM 12/08/2021    9:12 AM  GAD 7 : Generalized Anxiety Score  Nervous, Anxious, on Edge 0 '1 1 2  '$ Control/stop worrying 0 0 1 2  Worry too much - different things '1 1 1 2  '$ Trouble relaxing 0 '1 1 3  '$ Restless 0 0 0 0  Easily annoyed or irritable 0 '1 2 2  '$ Afraid - awful might happen 1 0 1 2  Total GAD 7 Score '2 4 7 13  '$ Anxiety Difficulty  Somewhat difficult  Somewhat difficult    Upstream - 08/20/22 0853       Pregnancy Intention Screening   Does the patient want to become pregnant in the next year? Ok Either Way    Does the patient's partner want to become pregnant  in the next year? Ok Either Way    Would the patient like to discuss contraceptive options today? No      Contraception Wrap Up   Current Method Female Condom    End Method Female Condom    Contraception Counseling Provided No               Assessment:     1. Anxiety Feels better on Buspar 7.5 mg 1 tid Will continue Buspar Meds ordered this encounter  Medications   busPIRone (BUSPAR) 7.5 MG tablet    Sig: Take 1 tablet (7.5 mg total) by mouth 3 (three) times daily.    Dispense:  90 tablet    Refill:  12    Order Specific Question:   Supervising Provider    Answer:   Florian Buff [[id number]]       Plan:     Follow up in 6 months with me for ROS or sooner if needed

## 2022-09-10 ENCOUNTER — Ambulatory Visit: Payer: Managed Care, Other (non HMO) | Admitting: Plastic Surgery

## 2022-09-10 ENCOUNTER — Encounter: Payer: Self-pay | Admitting: Plastic Surgery

## 2022-09-10 VITALS — BP 134/88 | HR 74

## 2022-09-10 DIAGNOSIS — N641 Fat necrosis of breast: Secondary | ICD-10-CM | POA: Insufficient documentation

## 2022-09-10 NOTE — Progress Notes (Signed)
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  Consent was confirmed and the patient understands the risks and benefits.  The potential complications and alternatives were explained and the patient consents.  The patient expressed understanding the option of not having the procedure and the risks of a scar.  Time out was called and all information was confirmed to be correct.    The area was prepped and drapped.  Lidocaine 1% with epinephrine 0.1 cc was mixed with Kenalog 50/5 mg 0.4 cc.  This was injected in the 4 x 3 cm area of the left breast around the 3 o'clock position to the areola.  A dressing was applied.  The patient was given instructions on how to care for the area and a follow up appointment.  Teresa Bright tolerated the procedure well and there were no complications.

## 2022-09-29 ENCOUNTER — Other Ambulatory Visit: Payer: Self-pay | Admitting: Physician Assistant

## 2022-09-29 DIAGNOSIS — K219 Gastro-esophageal reflux disease without esophagitis: Secondary | ICD-10-CM

## 2022-10-13 ENCOUNTER — Ambulatory Visit: Payer: Managed Care, Other (non HMO) | Admitting: Family

## 2022-10-13 VITALS — BP 119/87 | HR 94 | Temp 97.7°F | Ht 64.0 in | Wt 238.5 lb

## 2022-10-13 DIAGNOSIS — Z20818 Contact with and (suspected) exposure to other bacterial communicable diseases: Secondary | ICD-10-CM

## 2022-10-13 LAB — POCT RAPID STREP A (OFFICE): Rapid Strep A Screen: NEGATIVE

## 2022-10-13 MED ORDER — AMOXICILLIN 500 MG PO CAPS: 500.0000 mg | ORAL_CAPSULE | Freq: Two times a day (BID) | ORAL | 0 refills | Status: AC

## 2022-10-13 NOTE — Progress Notes (Signed)
Patient ID: Teresa Bright, female    DOB: 1985/09/01, 37 y.o.   MRN: [id number]  Chief Complaint  Patient presents with   Sore Throat    Pt c/o sore throat and Nasal congestion since Saturday, Has tried benadryl which did help congestion and sleep. Son and husband tested positive for strep.    HPI:      Sore throat:  sx started 4d ago, denies fever, but did have chills, her son & husband tested positive for strep. Also reports nasal congestion but no postnasal drip or nasal drainage.   Assessment & Plan:  Exposure to strep throat -  both son & husband positive for strep. Attempted twice to get a good POCT sample, but pt has a strong tongue/gag reflex and unable to get her tongue to relax. Will go ahead and treat due to exposure and current sx. Advised on use & SE of AMOX, take Ibuprofen or Aleve for throat pain.   Plan: POCT rapid strep A, amoxicillin (AMOXIL) 500 MG capsule,   Subjective:    Outpatient Medications Prior to Visit  Medication Sig Dispense Refill   busPIRone (BUSPAR) 7.5 MG tablet Take 1 tablet (7.5 mg total) by mouth 3 (three) times daily. 90 tablet 12   omeprazole (PRILOSEC) 40 MG capsule TAKE 1 CAPSULE BY MOUTH ONCE DAILY BEFORE BREAKFAST 90 capsule 0   No facility-administered medications prior to visit.   Past Medical History:  Diagnosis Date   Anxiety    GERD (gastroesophageal reflux disease)    History of chickenpox    Migraine    Pregnant 08/20/2014   Past Surgical History:  Procedure Laterality Date   BREAST BIOPSY Right 2021   x2   BREAST BIOPSY Right 08/19/2022   [place] RT BREAST BX W LOC DEV 1ST LESION IMG BX SPEC US GUIDE 08/19/2022 [facility]   BREAST BIOPSY Left 08/19/2022   MM LT BREAST BX W LOC DEV 1ST LESION IMAGE BX SPEC STEREO GUIDE 08/19/2022 [facility]   BREAST REDUCTION SURGERY Bilateral 10/09/2020   Procedure: BREAST REDUCTION;  Surgeon: Peggye Form, DO;  Location: [facility];  Service: Plastics;   Laterality: Bilateral;  3 hours   COLONOSCOPY     cysterectomy     LAPAROSCOPIC OVARIAN CYSTECTOMY Right 03/03/2016   Procedure: LAPAROSCOPIC RIGHT OVARIAN CYSTECTOMY AND RIGHT OOPHORECTOMY;  Surgeon: Lazaro Arms, MD;  Location: [facility];  Service: Gynecology;  Laterality: Right;   OOPHORECTOMY Right    REDUCTION MAMMAPLASTY Bilateral 09/2020   UPPER GI ENDOSCOPY     WISDOM TOOTH EXTRACTION     No Known Allergies    Objective:    Physical Exam Vitals and nursing note reviewed.  Constitutional:      Appearance: Normal appearance.  HENT:     Mouth/Throat:     Mouth: Mucous membranes are moist.     Pharynx: Posterior oropharyngeal erythema (mild) present. No pharyngeal swelling, oropharyngeal exudate or uvula swelling.  Cardiovascular:     Rate and Rhythm: Normal rate and regular rhythm.  Pulmonary:     Effort: Pulmonary effort is normal.     Breath sounds: Normal breath sounds.  Musculoskeletal:        General: Normal range of motion.  Lymphadenopathy:     Head:     Right side of head: No submental, submandibular, preauricular, posterior auricular or occipital adenopathy.     Left side of head: No submental, submandibular, preauricular, posterior auricular or occipital adenopathy.  Cervical: No cervical adenopathy.  Skin:    General: Skin is warm and dry.  Neurological:     Mental Status: She is alert.  Psychiatric:        Mood and Affect: Mood normal.        Behavior: Behavior normal.    BP 119/87   Pulse 94   Temp 97.7 F (36.5 C) (Temporal)   Ht  (1.626 m)   Wt 238 lb 8 oz (108.2 kg)   SpO2 100%   BMI 40.94 kg/m  Wt Readings from Last 3 Encounters:  10/13/22 238 lb 8 oz (108.2 kg)  08/20/22 237 lb 12.8 oz (107.9 kg)  04/21/22 236 lb 8 oz (107.3 kg)      Dulce Sellar, NP

## 2022-10-14 ENCOUNTER — Ambulatory Visit: Payer: Managed Care, Other (non HMO) | Admitting: Physician Assistant

## 2022-11-09 ENCOUNTER — Encounter: Payer: Self-pay | Admitting: Plastic Surgery

## 2022-11-09 ENCOUNTER — Ambulatory Visit: Payer: Managed Care, Other (non HMO) | Admitting: Plastic Surgery

## 2022-11-09 VITALS — BP 139/98 | HR 86

## 2022-11-09 DIAGNOSIS — N641 Fat necrosis of breast: Secondary | ICD-10-CM

## 2022-11-09 DIAGNOSIS — N62 Hypertrophy of breast: Secondary | ICD-10-CM

## 2022-11-09 NOTE — Progress Notes (Signed)
Procedure Note  Preoperative Dx: Fat necrosis left breast  Postoperative Dx: Same  Procedure: Kenalog injection to left breast fat necrosis 3.9 x 3 cm  Description of Procedure: Risks and complications were explained to the patient.  Consent was confirmed and the patient understands the risks and benefits.  The potential complications and alternatives were explained and the patient consents.  The patient expressed understanding the option of not having the procedure and the risks of a scar.  Time out was called and all information was confirmed to be correct.    The area was prepped.  Lidocaine 1% with epinephrine 0.1 cc was mixed with 0.2 cc of Kenalog 50/5 mg.  The lesion was injected with a mixture.  The patient states that the area improved a little bit from the last time it was injected.  A dressing was applied.  The patient was given instructions on how to care for the area and a follow up appointment.  Gusta tolerated the procedure well and there were no complications.  Will plan to see her back in 2 months.

## 2023-01-19 NOTE — Progress Notes (Signed)
Teresa Bright is a 37 y.o. female here for a new problem.  History of Present Illness:   Chief Complaint  Patient presents with   Menstrual Problem    Pt c/o irregular cycles x 6 months off and on. Bloating, last cycle 7/31.   Insomnia    Pt c/o trouble sleeping x several months.    HPI  Irregular Menses Menstrual cycles have been abnormally far apart, up to 40-50 days. This has happened several times since 2023. Has not missed any cycles. Denies concerns for pregnancy. Has never had concerns for PCOS in the past. History of right oophorectomy 2017, laparoscopic right ovarian cystectomy 2017. Had subsequent follow-up imaging of [place] and CT alternating every 6 months up until 2023. Follow-up imaging has been normal. No unusual hair growth.  Abscess Has a boil on right inner thigh- intermittently painful, no drainage. Has improved with warm compress but still present.   Anxiety Compliant with buspirone 7.5 mg daily. Experiencing some but not total relief with this. Has been having some abdominal bloating that she thinks could be related to anxiety. Has been taking omeprazole 40 mg three times weekly but has stopped completely this week to determine if she still has symptoms without it. Has been having trouble staying asleep. Drinks coffee throughout the day. Her sleep schedule is regular. Uses phone shortly before sleeping. Blood pressure elevated today at 140/90.  Past Medical History:  Diagnosis Date   Anxiety    GERD (gastroesophageal reflux disease)    History of chickenpox    Migraine    Pregnant 08/20/2014     Social History   Tobacco Use   Smoking status: Never   Smokeless tobacco: Never  Vaping Use   Vaping status: Never Used  Substance Use Topics   Alcohol use: Yes    Comment: occassionally    Drug use: No    Past Surgical History:  Procedure Laterality Date   BREAST BIOPSY Right 2021   x2   BREAST BIOPSY Right 08/19/2022   [place] RT BREAST BX W  LOC DEV 1ST LESION IMG BX SPEC US GUIDE 08/19/2022 [facility]   BREAST BIOPSY Left 08/19/2022   MM LT BREAST BX W LOC DEV 1ST LESION IMAGE BX SPEC STEREO GUIDE 08/19/2022 [facility]   BREAST REDUCTION SURGERY Bilateral 10/09/2020   Procedure: BREAST REDUCTION;  Surgeon: Peggye Form, DO;  Location: [facility];  Service: Plastics;  Laterality: Bilateral;  3 hours   COLONOSCOPY     cysterectomy     LAPAROSCOPIC OVARIAN CYSTECTOMY Right 03/03/2016   Procedure: LAPAROSCOPIC RIGHT OVARIAN CYSTECTOMY AND RIGHT OOPHORECTOMY;  Surgeon: Lazaro Arms, MD;  Location: [facility];  Service: Gynecology;  Laterality: Right;   OOPHORECTOMY Right    REDUCTION MAMMAPLASTY Bilateral 09/2020   UPPER GI ENDOSCOPY     WISDOM TOOTH EXTRACTION      Family History  Problem Relation Age of Onset   Hypertension Mother    Hyperlipidemia Mother    Anxiety disorder Mother    Healthy Father    Healthy Brother    Cancer Maternal Grandmother        skin ; also intestinal malignancy in 82s   Diabetes Maternal Grandmother    Dementia Maternal Grandmother    Stroke Maternal Grandfather    Aneurysm Maternal Grandfather        brain   Cancer Paternal Grandmother        bone and breast   Breast cancer Paternal Grandmother  Mental illness Maternal Uncle    Stroke Maternal Uncle     No Known Allergies  Current Medications:   Current Outpatient Medications:    busPIRone (BUSPAR) 7.5 MG tablet, Take 1 tablet (7.5 mg total) by mouth 3 (three) times daily., Disp: 90 tablet, Rfl: 12   omeprazole (PRILOSEC) 40 MG capsule, TAKE 1 CAPSULE BY MOUTH ONCE DAILY BEFORE BREAKFAST, Disp: 90 capsule, Rfl: 0   Review of Systems:   Review of Systems  Constitutional:  Negative for fever and malaise/fatigue.  HENT:  Negative for congestion.   Eyes:  Negative for blurred vision.  Respiratory:  Negative for cough and shortness of breath.   Cardiovascular:  Negative for chest pain,  palpitations and leg swelling.  Gastrointestinal:  Positive for abdominal pain (Bloating). Negative for vomiting.  Musculoskeletal:  Negative for back pain.  Skin:  Negative for rash.       (+) Boil right inner thigh  Neurological:  Negative for loss of consciousness and headaches.  Psychiatric/Behavioral:  The patient is nervous/anxious and has insomnia.     Vitals:   Vitals:   01/26/23 0938  BP: (!) 140/90  Pulse: 76  Temp: (!) 97.1 F (36.2 C)  TempSrc: Temporal  SpO2: 96%  Weight: 234 lb 4 oz (106.3 kg)  Height: 5\' 4"  (1.626 m)     Body mass index is 40.21 kg/m.  Physical Exam:   Physical Exam Vitals and nursing note reviewed.  Constitutional:      General: She is not in acute distress.    Appearance: She is well-developed. She is not ill-appearing or toxic-appearing.  Cardiovascular:     Rate and Rhythm: Normal rate and regular rhythm.     Pulses: Normal pulses.     Heart sounds: Normal heart sounds, S1 normal and S2 normal.  Pulmonary:     Effort: Pulmonary effort is normal.     Breath sounds: Normal breath sounds.  Skin:    General: Skin is warm and dry.     Comments: Approximately 1 cm raised erythematous pustule to right inner thigh with fluctuance but no induration; slight tenderness to palpation   Neurological:     Mental Status: She is alert.     GCS: GCS eye subscore is 4. GCS verbal subscore is 5. GCS motor subscore is 6.  Psychiatric:        Speech: Speech normal.        Behavior: Behavior normal. Behavior is cooperative.     Assessment and Plan:   Irregular periods Recommend close follow-up with obstetrics-gynecology I have reviewed her chart and reached out to gynecology about her ongoing neoplasm follow-up Recommend continued tracking of periods We will update blood work next month and I recommended follow-up with gynecology for any hormonal testing needs  Anxiety Uncontrolled Insomnia is impacting this Recommend adding trazodone 50 mg  nightly or as needed  Continue buspar Follow-up in 1 month for Comprehensive Physical Exam (CPE) preventive care annual visit and follow-up on trazodone use  Abscess No red flags or signs of systemic illness Recommend oral doxycycline  If lack of improvement or worsening, recommend close follow-up with [place] for possible I&D     I,Alexander Ruley,acting as a scribe for [employer], PA.,have documented all relevant documentation on the behalf of Jarold Motto, PA,as directed by  Jarold Motto, PA while in the presence of Jarold Motto, [place].  I, Jarold Motto, [place], have reviewed all documentation for this visit. The documentation on 01/26/23 for the exam,  diagnosis, procedures, and orders are all accurate and complete.   Jarold Motto, PA-C

## 2023-01-26 ENCOUNTER — Ambulatory Visit: Payer: Managed Care, Other (non HMO) | Admitting: Physician Assistant

## 2023-01-26 ENCOUNTER — Encounter: Payer: Self-pay | Admitting: Physician Assistant

## 2023-01-26 VITALS — BP 140/90 | HR 76 | Temp 97.1°F | Ht 64.0 in | Wt 234.2 lb

## 2023-01-26 DIAGNOSIS — N926 Irregular menstruation, unspecified: Secondary | ICD-10-CM | POA: Diagnosis not present

## 2023-01-26 DIAGNOSIS — F419 Anxiety disorder, unspecified: Secondary | ICD-10-CM

## 2023-01-26 DIAGNOSIS — L0291 Cutaneous abscess, unspecified: Secondary | ICD-10-CM

## 2023-01-26 MED ORDER — TRAZODONE HCL 50 MG PO TABS: 25.0000 mg | ORAL_TABLET | Freq: Every evening | ORAL | 1 refills | Status: DC | PRN

## 2023-01-26 MED ORDER — DOXYCYCLINE HYCLATE 100 MG PO TABS: 100.0000 mg | ORAL_TABLET | Freq: Two times a day (BID) | ORAL | 0 refills | Status: DC

## 2023-01-26 NOTE — Patient Instructions (Signed)
It was great to see you!  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 (2) Get up from bed at the same time each day. (3) Get regular exercise each day, preferably in the morning.  There is goof evidence that regular exercise improves restful sleep.  This includes stretching and aerobic exercise. (4) Get regular exposure to outdoor or bright lights, especially in the late afternoon. (5) Keep the temperature in your bedroom comfortable. (6) Keep the bedroom quiet when sleeping. (7) Keep the bedroom dark enough to facilitate sleep. (8) Use your bed only for sleep and sex. (9) Take medications as directed.  It is helpful to take prescribed sleeping pills 1 hour before bedtime, so they are causing drowsiness when you lie down, or 10 hours before getting up, to avoid daytime drowsiness. (10) Use a relaxation exercise just before going to sleep -- imagery, massage, warm bath. (11) Keep your feet and hands warm.  Wear warm socks and/or mittens or gloves to bed.  Don't: (1) Exercise just before going to bed. (2) Engage in stimulating activity just before bed, such as playing a competitive game, watching an exciting program on television, or having an important discussion with a loved one. (3) Have caffeine in the evening (coffee, teas, chocolate, sodas, etc.) (4) Read or watch television in bed. (5) Use alcohol to help you sleep. (6) Go to bed too hungry or too full. (7) Take another person's sleeping pills. (8) Take over-the-counter sleeping pills, without your doctor's knowledge.  Tolerance can develop rapidly with these medications.  Diphenhydramine can have serious side effects for elderly  patients. (9) Take daytime naps. (10) Command yourself to go to sleep.  This only makes your mind and body more alert.  If you lie awake for more than 20-30 minutes, get up, go to a different room, participate in a quiet activity (Ex - non-excitable reading or television), and then return to bed when you feel sleepy.  Do this as many times during the night as needed.  This may cause you to have a night or two of poor sleep but it will train your brain to know when it is time for sleep.     Take care,  Jarold Motto PA-C

## 2023-02-01 ENCOUNTER — Encounter: Payer: Self-pay | Admitting: Plastic Surgery

## 2023-02-01 ENCOUNTER — Encounter: Payer: Self-pay | Admitting: Physician Assistant

## 2023-02-01 ENCOUNTER — Ambulatory Visit: Payer: Managed Care, Other (non HMO) | Admitting: Plastic Surgery

## 2023-02-01 DIAGNOSIS — N641 Fat necrosis of breast: Secondary | ICD-10-CM

## 2023-02-01 NOTE — Progress Notes (Signed)
   Subjective:    Patient ID: Teresa Bright, female    DOB: Dec 13, 1985, 37 y.o.   MRN: [id number]  HPI The patient is a 37 year old female here for follow-up on her breast surgery.  He underwent bilateral breast reduction in April 2022.  Teresa Bright had over 1700 g removed from both breasts.  Teresa Bright noticed the little bit of fat necrosis in the left breast around the 2 to 3 o'clock position.  We did Kenalog injection about 3 months ago.  It has seemed to have made a difference and soften the area.  We will do a another 1 today.   Review of Systems  Constitutional: Negative.   Eyes: Negative.   Respiratory: Negative.    Cardiovascular: Negative.   Gastrointestinal: Negative.   Endocrine: Negative.   Genitourinary: Negative.        Objective:   Physical Exam Cardiovascular:     Rate and Rhythm: Normal rate.     Pulses: Normal pulses.  Skin:    Capillary Refill: Capillary refill takes less than 2 seconds.     Coloration: Skin is not jaundiced.     Findings: Lesion present. No bruising.  Neurological:     Mental Status: Teresa Bright is alert and oriented to person, place, and time.  Psychiatric:        Mood and Affect: Mood normal.        Behavior: Behavior normal.        Thought Content: Thought content normal.        Judgment: Judgment normal.         Assessment & Plan:     ICD-10-CM   1. Fat necrosis (segmental) of breast  N64.1        We did another Kenalog injection with the 50/5 mg 0.2 cc mixed with lidocaine with epinephrine 0.2 cc.  This was injected into the fat necrosis at the 2-3 o'clock position.  Will plan for follow-up in 4 months.

## 2023-02-03 ENCOUNTER — Encounter (INDEPENDENT_AMBULATORY_CARE_PROVIDER_SITE_OTHER): Payer: Self-pay

## 2023-02-16 NOTE — Progress Notes (Signed)
Subjective:    Teresa Bright is a 37 y.o. female and is here for a comprehensive physical exam.  HPI  There are no preventive care reminders to display for this patient.  Acute Concerns: None  Chronic Issues: Anxiety; Insomnia Treated with buspirone 7.5 mg three times daily, trazodone 25-50 mg at bedtime. Buspirone has minimal effects on anxiety. Trazodone is helping her stay asleep without negative side effects.  Irregular periods Had an ovarian cyst removed in 2017 and subsequent right ovary removal. She is not sure whether she will have more children. Discussed the option of removing her remaining ovary -- per gynecology it is recommended to consider removal of her tubes and ovaries once she is done having children. Denies changes in menstrual cycles.  Labs are pending.  Wears sunscreen regularly.  Denies swelling in ankles.  Health Maintenance: Immunizations -- UTD on tetanus vaccine. Administered flu vaccine today. Colonoscopy -- N/A Mammogram -- N/A PAP -- Last completed 02/17/22. Negative for intraepithelial lesion or malignancy. Bone Density -- N/A Diet -- Healthy Exercise -- She has a watch with activity reminders but believes she should walk more than she does.  Sleep habits -- Stable Mood -- Stable  UTD with dentist? - Yes UTD with eye doctor? - Yes  Weight history: Wt Readings from Last 10 Encounters:  02/23/23 234 lb 4 oz (106.3 kg)  01/26/23 234 lb 4 oz (106.3 kg)  10/13/22 238 lb 8 oz (108.2 kg)  08/20/22 237 lb 12.8 oz (107.9 kg)  04/21/22 236 lb 8 oz (107.3 kg)  04/07/22 235 lb (106.6 kg)  03/03/22 235 lb 6 oz (106.8 kg)  02/17/22 233 lb (105.7 kg)  12/18/21 236 lb (107 kg)  12/08/21 236 lb (107 kg)   Body mass index is 40.21 kg/m. Patient's last menstrual period was 02/15/2023 (exact date).  Alcohol use:  reports current alcohol use.  Tobacco use:  Tobacco Use: Low Risk  (02/23/2023)   Patient History    Smoking Tobacco Use: Never     Smokeless Tobacco Use: Never    Passive Exposure: Not on file   Eligible for lung cancer screening? No     02/23/2023    8:04 AM  Depression screen PHQ 2/9  Decreased Interest 1  Down, Depressed, Hopeless 0  PHQ - 2 Score 1  Altered sleeping 1  Tired, decreased energy 1  Change in appetite 0  Feeling bad or failure about yourself  0  Trouble concentrating 0  Moving slowly or fidgety/restless 0  Suicidal thoughts 0  PHQ-9 Score 3  Difficult doing work/chores Somewhat difficult     Other providers/specialists: Patient Care Team: Jarold Motto, [place] as PCP - General (Physician Assistant)    PMHx, SurgHx, SocialHx, Medications, and Allergies were reviewed in the Visit Navigator and updated as appropriate.   Past Medical History:  Diagnosis Date   Anxiety    GERD (gastroesophageal reflux disease)    History of chickenpox    Migraine    Pregnant 08/20/2014     Past Surgical History:  Procedure Laterality Date   BREAST BIOPSY Right 2021   x2   BREAST BIOPSY Right 08/19/2022   [place] RT BREAST BX W LOC DEV 1ST LESION IMG BX SPEC US GUIDE 08/19/2022 [facility]   BREAST BIOPSY Left 08/19/2022   MM LT BREAST BX W LOC DEV 1ST LESION IMAGE BX SPEC STEREO GUIDE 08/19/2022 [facility]   BREAST REDUCTION SURGERY Bilateral 10/09/2020   Procedure: BREAST REDUCTION;  Surgeon: Ulice Bold,  Alena Bills, DO;  Location: [facility];  Service: Plastics;  Laterality: Bilateral;  3 hours   COLONOSCOPY     cysterectomy     LAPAROSCOPIC OVARIAN CYSTECTOMY Right 03/03/2016   Procedure: LAPAROSCOPIC RIGHT OVARIAN CYSTECTOMY AND RIGHT OOPHORECTOMY;  Surgeon: Lazaro Arms, MD;  Location: [facility];  Service: Gynecology;  Laterality: Right;   OOPHORECTOMY Right    REDUCTION MAMMAPLASTY Bilateral 09/2020   UPPER GI ENDOSCOPY     WISDOM TOOTH EXTRACTION       Family History  Problem Relation Age of Onset   Hypertension Mother    Hyperlipidemia Mother    Anxiety disorder  Mother    Healthy Father    Healthy Brother    Mental illness Maternal Uncle    Stroke Maternal Uncle    Lymphoma Paternal Aunt    Cancer Maternal Grandmother        skin ; also intestinal malignancy in 71s   Diabetes Maternal Grandmother    Dementia Maternal Grandmother    Stroke Maternal Grandfather    Aneurysm Maternal Grandfather        brain   Cancer Paternal Grandmother        bone and breast   Breast cancer Paternal Grandmother     Social History   Tobacco Use   Smoking status: Never   Smokeless tobacco: Never  Vaping Use   Vaping status: Never Used  Substance Use Topics   Alcohol use: Yes    Comment: occassionally    Drug use: No    Review of Systems:   Review of Systems  Constitutional:  Negative for chills, fever, malaise/fatigue and weight loss.  HENT:  Negative for hearing loss, sinus pain and sore throat.   Respiratory:  Negative for cough, hemoptysis and shortness of breath.   Cardiovascular:  Negative for chest pain, palpitations, leg swelling and PND.  Gastrointestinal:  Negative for abdominal pain, constipation, diarrhea, heartburn, nausea and vomiting.  Genitourinary:  Negative for dysuria, frequency and urgency.  Musculoskeletal:  Negative for back pain, myalgias and neck pain.  Skin:  Negative for itching and rash.  Neurological:  Negative for dizziness, tingling, seizures and headaches.  Endo/Heme/Allergies:  Negative for polydipsia.  Psychiatric/Behavioral:  Negative for depression. The patient is not nervous/anxious.     Objective:   BP 130/84 (BP Location: Left Arm, Patient Position: Sitting, Cuff Size: Large)   Pulse 71   Temp (!) 97.5 F (36.4 C) (Temporal)   Ht 5\' 4"  (1.626 m)   Wt 234 lb 4 oz (106.3 kg)   LMP 02/15/2023 (Exact Date)   SpO2 97%   BMI 40.21 kg/m  Body mass index is 40.21 kg/m.   General Appearance:    Alert, cooperative, no distress, appears stated age  Head:    Normocephalic, without obvious abnormality,  atraumatic  Eyes:    PERRL, conjunctiva/corneas clear, EOM's intact, fundi    benign, both eyes  Ears:    Normal TM's and external ear canals, both ears  Nose:   Nares normal, septum midline, mucosa normal, no drainage    or sinus tenderness  Throat:   Lips, mucosa, and tongue normal; teeth and gums normal  Neck:   Supple, symmetrical, trachea midline, no adenopathy;    thyroid:  no enlargement/tenderness/nodules; no carotid   bruit or JVD  Back:     Symmetric, no curvature, ROM normal, no CVA tenderness  Lungs:     Clear to auscultation bilaterally, respirations unlabored  Chest Wall:  No tenderness or deformity   Heart:    Regular rate and rhythm, S1 and S2 normal, no murmur, rub or gallop  Breast Exam:    Deferred   Abdomen:     Soft, non-tender, bowel sounds active all four quadrants,    no masses, no organomegaly  Genitalia:    Deferred  Extremities:   Extremities normal, atraumatic, no cyanosis or edema  Pulses:   2+ and symmetric all extremities  Skin:   Skin color, texture, turgor normal, no rashes or lesions  Lymph nodes:   Cervical, supraclavicular, and axillary nodes normal  Neurologic:   CNII-XII intact, normal strength, sensation and reflexes    throughout    Assessment/Plan:   Routine physical examination Today patient counseled on age appropriate routine health concerns for screening and prevention, each reviewed and up to date or declined. Immunizations reviewed and up to date or declined. Labs ordered and reviewed. Risk factors for depression reviewed and negative. Hearing function and visual acuity are intact. ADLs screened and addressed as needed. Functional ability and level of safety reviewed and appropriate. Education, counseling and referrals performed based on assessed risks today. Patient provided with a copy of personalized plan for preventive services.  Encounter for immunization Update flu shot today  Anxiety Overall stable but room for  improvement Increase buspar to 15 mg twice daily to help with better control of anxiety and compliance of medication with less frequent dosing Follow-up in 3-6 month(s), sooner if concerns  Irregular periods Recommend close follow-up with gynecology -- she is planning to request hormonal testing at follow-up Will update blood work today to rule out organic cause Continue to track periods  Obesity, unspecified classification, unspecified obesity type, unspecified whether serious comorbidity present Continue efforts at healthy lifestyle   I,Alexander Ruley,acting as a Neurosurgeon for [employer], PA.,have documented all relevant documentation on the behalf of Jarold Motto, PA,as directed by  Jarold Motto, PA while in the presence of Jarold Motto, [place].  I, Jarold Motto, [place], have reviewed all documentation for this visit. The documentation on 02/23/23 for the exam, diagnosis, procedures, and orders are all accurate and complete.  Jarold Motto, PA-C  [facility]

## 2023-02-23 ENCOUNTER — Encounter: Payer: Self-pay | Admitting: Physician Assistant

## 2023-02-23 ENCOUNTER — Ambulatory Visit (INDEPENDENT_AMBULATORY_CARE_PROVIDER_SITE_OTHER): Payer: Managed Care, Other (non HMO) | Admitting: Physician Assistant

## 2023-02-23 VITALS — BP 130/84 | HR 71 | Temp 97.5°F | Ht 64.0 in | Wt 234.2 lb

## 2023-02-23 DIAGNOSIS — Z23 Encounter for immunization: Secondary | ICD-10-CM | POA: Diagnosis not present

## 2023-02-23 DIAGNOSIS — E669 Obesity, unspecified: Secondary | ICD-10-CM | POA: Diagnosis not present

## 2023-02-23 DIAGNOSIS — Z0000 Encounter for general adult medical examination without abnormal findings: Secondary | ICD-10-CM

## 2023-02-23 DIAGNOSIS — N926 Irregular menstruation, unspecified: Secondary | ICD-10-CM

## 2023-02-23 DIAGNOSIS — F419 Anxiety disorder, unspecified: Secondary | ICD-10-CM

## 2023-02-23 LAB — COMPREHENSIVE METABOLIC PANEL
ALT: 17 U/L (ref 0–35)
AST: 13 U/L (ref 0–37)
Albumin: 4 g/dL (ref 3.5–5.2)
Alkaline Phosphatase: 65 U/L (ref 39–117)
BUN: 14 mg/dL (ref 6–23)
CO2: 27 meq/L (ref 19–32)
Calcium: 9 mg/dL (ref 8.4–10.5)
Chloride: 106 meq/L (ref 96–112)
Creatinine, Ser: 0.74 mg/dL (ref 0.40–1.20)
GFR: 103.05 mL/min (ref >60.00)
Glucose, Bld: 95 mg/dL (ref 70–99)
Potassium: 3.8 meq/L (ref 3.5–5.1)
Sodium: 139 meq/L (ref 135–145)
Total Bilirubin: 0.9 mg/dL (ref 0.2–1.2)
Total Protein: 6.8 g/dL (ref 6.0–8.3)

## 2023-02-23 LAB — CBC WITH DIFFERENTIAL/PLATELET
Basophils Absolute: 0.1 10*3/uL (ref 0.0–0.1)
Basophils Relative: 1 % (ref 0.0–3.0)
Eosinophils Absolute: 0.1 10*3/uL (ref 0.0–0.7)
Eosinophils Relative: 1.5 % (ref 0.0–5.0)
HCT: 40.4 % (ref 36.0–46.0)
Hemoglobin: 13.3 g/dL (ref 12.0–15.0)
Lymphocytes Relative: 37.3 % (ref 12.0–46.0)
Lymphs Abs: 2.3 10*3/uL (ref 0.7–4.0)
MCHC: 32.8 g/dL (ref 30.0–36.0)
MCV: 86.5 fl (ref 78.0–100.0)
Monocytes Absolute: 0.5 10*3/uL (ref 0.1–1.0)
Monocytes Relative: 8.7 % (ref 3.0–12.0)
Neutro Abs: 3.2 10*3/uL (ref 1.4–7.7)
Neutrophils Relative %: 51.5 % (ref 43.0–77.0)
Platelets: 341 10*3/uL (ref 150.0–400.0)
RBC: 4.67 Mil/uL (ref 3.87–5.11)
RDW: 13.4 % (ref 11.5–15.5)
WBC: 6.1 10*3/uL (ref 4.0–10.5)

## 2023-02-23 LAB — LIPID PANEL
Cholesterol: 195 mg/dL (ref 0–200)
HDL: 42.4 mg/dL (ref >39.00)
LDL Cholesterol: 129 mg/dL — ABNORMAL HIGH (ref 0–99)
NonHDL: 152.4
Total CHOL/HDL Ratio: 5
Triglycerides: 116 mg/dL (ref 0.0–149.0)
VLDL: 23.2 mg/dL (ref 0.0–40.0)

## 2023-02-23 LAB — HEMOGLOBIN A1C: Hgb A1c MFr Bld: 5.8 % (ref 4.6–6.5)

## 2023-02-23 LAB — TSH: TSH: 1.04 u[IU]/mL (ref 0.35–5.50)

## 2023-02-23 MED ORDER — BUSPIRONE HCL 15 MG PO TABS: 15.0000 mg | ORAL_TABLET | Freq: Two times a day (BID) | ORAL | 2 refills | Status: DC

## 2023-02-23 NOTE — Patient Instructions (Addendum)
It was great to see you!  Change your buspar to 15 mg twice daily   Please go to the lab for blood work.   Our office will call you with your results unless you have chosen to receive results via MyChart.  If your blood work is normal we will follow-up each year for physicals and as scheduled for chronic medical problems.  If anything is abnormal we will treat accordingly and get you in for a follow-up.  Take care,  Lelon Mast

## 2023-04-29 ENCOUNTER — Other Ambulatory Visit: Payer: Self-pay | Admitting: Physician Assistant

## 2023-04-29 DIAGNOSIS — K219 Gastro-esophageal reflux disease without esophagitis: Secondary | ICD-10-CM

## 2023-06-01 ENCOUNTER — Telehealth: Payer: Self-pay | Admitting: Plastic Surgery

## 2023-06-01 NOTE — Telephone Encounter (Signed)
called and lft vmail 06-01-23 to r/s provider out of office, needs to be moved to different day Wed afternoon or sat morning.

## 2023-06-03 ENCOUNTER — Ambulatory Visit: Payer: Managed Care, Other (non HMO) | Admitting: Plastic Surgery

## 2023-06-04 ENCOUNTER — Ambulatory Visit: Payer: Managed Care, Other (non HMO) | Admitting: Plastic Surgery

## 2023-06-04 ENCOUNTER — Encounter: Payer: Self-pay | Admitting: Plastic Surgery

## 2023-06-04 VITALS — BP 143/105 | HR 85 | Ht 64.0 in

## 2023-06-04 DIAGNOSIS — N641 Fat necrosis of breast: Secondary | ICD-10-CM

## 2023-06-04 NOTE — Progress Notes (Signed)
Procedure Note  Preoperative Dx: fat necrosis left breast upper outer quadrant 2.5 cm  Postoperative Dx: Same  Procedure: kenalog injection to left breast fat necrosis  Description of Procedure: Risks and complications were explained to the patient.  Consent was confirmed and the patient understands the risks and benefits.  The potential complications and alternatives were explained and the patient consents.  The patient expressed understanding the option of not having the procedure and the risks of a scar.  Time out was called and all information was confirmed to be correct.    The area was prepped and drapped.  Lidocaine 1% with epinephrine 0.1 cc was mixed with kenalog 50/5 mg 0.3 cc.  The mixture was injected into the firm area.  The patient was given instructions on how to care for the area and a follow up appointment.  Teresa Bright tolerated the procedure well and there were no complications.

## 2023-06-28 ENCOUNTER — Encounter: Payer: Self-pay | Admitting: Physician Assistant

## 2023-06-28 ENCOUNTER — Ambulatory Visit: Payer: Managed Care, Other (non HMO) | Admitting: Physician Assistant

## 2023-06-28 VITALS — BP 140/100 | HR 72 | Temp 97.4°F | Ht 64.0 in | Wt 227.4 lb

## 2023-06-28 DIAGNOSIS — R030 Elevated blood-pressure reading, without diagnosis of hypertension: Secondary | ICD-10-CM

## 2023-06-28 DIAGNOSIS — F419 Anxiety disorder, unspecified: Secondary | ICD-10-CM

## 2023-06-28 MED ORDER — AMLODIPINE BESYLATE 5 MG PO TABS: 5.0000 mg | ORAL_TABLET | Freq: Every day | ORAL | 1 refills | Status: DC

## 2023-06-28 NOTE — Patient Instructions (Signed)
 It was great to see you!  Work on limiting salt/caffeine/stress  Let me know if you have concerns/suspicions of possible obstructive sleep apnea -- we can see this with patients with high diastolic (bottom number) blood pressure  Start amlodipine  5 mg daily  Please continue to monitor your blood pressure   Continue to use condoms, this medication is not safe for pregnancy  Message me or follow-up in office in about 1 month with readings -- sooner if concerns  Take care,  Lucie Buttner PA-

## 2023-06-28 NOTE — Progress Notes (Signed)
 Teresa Bright is a 38 y.o. female here for a new problem.  History of Present Illness:   Chief Complaint  Patient presents with   c/o Elevated blood pressure    Pt c/o blood pressure elevated for the past few weeks, systolic 130-140, diastolic 100's    HPI  Elevated Blood Pressure Readings  She complains today of elevated blood pressure readings that has persisted for the past few weeks.  Her at home blood pressure readings are around 130-140/100s.  Currently not taking any medications.  She denies any chest pain, SOB, blurred vision, dizziness, unusual headaches, lower leg swelling. Patient is not on medication. Denies excessive caffeine intake, stimulant usage, excessive alcohol intake, or increase in salt consumption. She has been more aware of her sodium and water intake. She typically goes on walks but has stopped due to the cold weather.  She does report having a daily intake of coffee and caffeinated drinks. She typically has 2-3 caffeinated drinks a day.  She's unsure if she exhibits any sleep apnea symptoms but states that she has started using an air purifier. She typically sleeps with multiple pillows to elevate her head.    She denies being on any birth control at this time.   Anxiety  She does report being anxious about her elevated blood pressure readings and the recent holidays. She does report compliance with Buspar  15 mg but states that she wasn't take it regularly around the holidays.  She's unsure if the medication is improving her symptoms.     Past Medical History:  Diagnosis Date   Anxiety    GERD (gastroesophageal reflux disease)    History of chickenpox    Migraine    Pregnant 08/20/2014     Social History   Tobacco Use   Smoking status: Never   Smokeless tobacco: Never  Vaping Use   Vaping status: Never Used  Substance Use Topics   Alcohol use: Yes    Comment: social; weekend intake no binge drinking   Drug use: No    Past Surgical History:   Procedure Laterality Date   BREAST BIOPSY Right 2021   x2   BREAST BIOPSY Right 08/19/2022   US  RT BREAST BX W LOC DEV 1ST LESION IMG BX SPEC US  GUIDE 08/19/2022 [facility]   BREAST BIOPSY Left 08/19/2022   MM LT BREAST BX W LOC DEV 1ST LESION IMAGE BX SPEC STEREO GUIDE 08/19/2022 [facility]   BREAST REDUCTION SURGERY Bilateral 10/09/2020   Procedure: BREAST REDUCTION;  Surgeon: Lowery Estefana RAMAN, DO;  Location: [facility];  Service: Plastics;  Laterality: Bilateral;  3 hours   COLONOSCOPY     cysterectomy     LAPAROSCOPIC OVARIAN CYSTECTOMY Right 03/03/2016   Procedure: LAPAROSCOPIC RIGHT OVARIAN CYSTECTOMY AND RIGHT OOPHORECTOMY;  Surgeon: Vonn VEAR Inch, MD;  Location: [facility];  Service: Gynecology;  Laterality: Right;   OOPHORECTOMY Right    REDUCTION MAMMAPLASTY Bilateral 09/2020   UPPER GI ENDOSCOPY     WISDOM TOOTH EXTRACTION      Family History  Problem Relation Age of Onset   Hypertension Mother    Hyperlipidemia Mother    Anxiety disorder Mother    Healthy Father    Healthy Brother    Cancer Maternal Grandmother        skin ; also intestinal malignancy in 62s   Diabetes Maternal Grandmother    Dementia Maternal Grandmother    Stroke Maternal Grandfather    Aneurysm Maternal Grandfather  brain   Cancer Paternal Grandmother        bone and breast   Breast cancer Paternal Grandmother        dx at >27 yo   Mental illness Maternal Uncle    Stroke Maternal Uncle    Lymphoma Paternal Aunt 40    No Known Allergies  Current Medications:   Current Outpatient Medications:    amLODipine  (NORVASC ) 5 MG tablet, Take 1 tablet (5 mg total) by mouth daily., Disp: 30 tablet, Rfl: 1   busPIRone  (BUSPAR ) 15 MG tablet, Take 1 tablet (15 mg total) by mouth 2 (two) times daily., Disp: 180 tablet, Rfl: 2   omeprazole  (PRILOSEC) 40 MG capsule, TAKE 1 CAPSULE BY MOUTH ONCE DAILY BEFORE BREAKFAST, Disp: 90 capsule, Rfl: 0   traZODone  (DESYREL ) 50  MG tablet, Take 0.5-1 tablets (25-50 mg total) by mouth at bedtime as needed for sleep., Disp: 30 tablet, Rfl: 1   Review of Systems:   Review of Systems  Eyes:  Negative for blurred vision.  Respiratory:  Negative for shortness of breath.   Cardiovascular:  Negative for leg swelling.  Neurological:  Negative for dizziness and headaches.  Psychiatric/Behavioral:  The patient is nervous/anxious.     Vitals:   Vitals:   06/28/23 0854 06/28/23 0923  BP: (!) 130/100 (!) 140/100  Pulse: 72   Temp: (!) 97.4 F (36.3 C)   TempSrc: Temporal   SpO2: 98%   Weight: 227 lb 6.1 oz (103.1 kg)   Height: 5' 4 (1.626 m)      Body mass index is 39.03 kg/m.  Physical Exam:   Physical Exam Vitals and nursing note reviewed.  Constitutional:      General: She is not in acute distress.    Appearance: She is well-developed. She is not ill-appearing or toxic-appearing.  Cardiovascular:     Rate and Rhythm: Normal rate and regular rhythm.     Pulses: Normal pulses.     Heart sounds: Normal heart sounds, S1 normal and S2 normal.  Pulmonary:     Effort: Pulmonary effort is normal.     Breath sounds: Normal breath sounds.  Skin:    General: Skin is warm and dry.  Neurological:     Mental Status: She is alert.     GCS: GCS eye subscore is 4. GCS verbal subscore is 5. GCS motor subscore is 6.  Psychiatric:        Speech: Speech normal.        Behavior: Behavior normal. Behavior is cooperative.     Assessment and Plan:   Elevated blood pressure reading Above goal today No evidence of end-organ damage on my exam Recommend patient monitor home blood pressure at least a few times weekly Start amlodipine  5 mg daily -- discussed risks/benefits/side effect(s) to medications If home monitoring shows consistent elevation, or any symptom(s) develop, recommend reach out to us  for further advice on next steps Asked for her to send MyChart message or follow-up in office in 1  month(s)  Anxiety Overall stable per patient Declines need for change in regimen Continue buspar  15 mg twice daily Follow-up annually, sooner if concerns   I,Safa M Kadhim,acting as a scribe for [employer], PA.,have documented all relevant documentation on the behalf of Lucie Buttner, PA,as directed by  Lucie Buttner, PA while in the presence of Lucie Buttner, [place].   I, Lucie Buttner, [place], have reviewed all documentation for this visit. The documentation on 06/28/23 for the exam, diagnosis, procedures,  and orders are all accurate and complete.   Lucie Buttner, PA-C

## 2023-07-25 ENCOUNTER — Other Ambulatory Visit: Payer: Self-pay | Admitting: Physician Assistant

## 2023-07-25 DIAGNOSIS — K219 Gastro-esophageal reflux disease without esophagitis: Secondary | ICD-10-CM

## 2023-07-26 ENCOUNTER — Other Ambulatory Visit: Payer: Self-pay | Admitting: Physician Assistant

## 2023-07-26 DIAGNOSIS — Z1231 Encounter for screening mammogram for malignant neoplasm of breast: Secondary | ICD-10-CM

## 2023-08-01 ENCOUNTER — Ambulatory Visit: Payer: Managed Care, Other (non HMO) | Admitting: Physician Assistant

## 2023-08-02 ENCOUNTER — Ambulatory Visit: Payer: Managed Care, Other (non HMO) | Admitting: Physician Assistant

## 2023-08-09 ENCOUNTER — Ambulatory Visit: Payer: Managed Care, Other (non HMO) | Admitting: Physician Assistant

## 2023-08-09 ENCOUNTER — Encounter: Payer: Self-pay | Admitting: Physician Assistant

## 2023-08-09 VITALS — BP 124/88 | HR 74 | Temp 97.0°F | Ht 64.0 in | Wt 223.2 lb

## 2023-08-09 DIAGNOSIS — R1310 Dysphagia, unspecified: Secondary | ICD-10-CM

## 2023-08-09 DIAGNOSIS — I10 Essential (primary) hypertension: Secondary | ICD-10-CM

## 2023-08-09 NOTE — Patient Instructions (Signed)
It was great to see you!  SnoreLab app for snoring -- try it out and report back  HOLD your amlodipine tonight Start increased dose of amlodipine to 10 mg tomorrow morning  If swallowing issues with pills persist, let me know and I will refer to gastroenterology   Your blood pressure is elevated in our office today.  I recommend that you monitor this at home.  Your goal blood pressure should be around < 130/80, unless you are over 39 years old, your goal may be closer to 140-150/90. Please note if you have been given other goals from a cardiologist or other healthcare provider, please defer to their recommendations.  When preparing to take your blood pressure: Plan ahead. Don't smoke, drink caffeine or exercise within 30 minutes before taking your blood pressure. Empty your bladder. Don't take the measurement over clothes. Remove the clothing over the arm that will be used to measure blood pressure. You can use either arm unless otherwise told by a healthcare provider. Usually there is not a big difference between readings on them. Be still. Allow at least five minutes of quiet rest before measurements. Don't talk or use the phone. Sit correctly. Sit with your back straight and supported (on a dining chair, rather than a sofa). Your feet should be flat on the floor. Do not cross your legs. Support your arm on a flat surface. The middle of the cuff should be placed on the upper arm at heart level.  Measure at the same time of the day. Take multiple readings and record the results. Each time you measure, take two readings one minute apart. Record the results and bring in to your next office visit.  In order to know how well the medication is working, I would like you to take your readings 1-2 hours after taking your blood pressure medication if possible. Take your blood pressure measurements and record 2-3 days per week. Message me in 1 month your readings OR make an appointment.  If you get  a high blood pressure reading: A single high reading is not an immediate cause for alarm. If you get a reading that is higher than normal, take your blood pressure a second time. Write down the results of both measurements. Check with your health care professional to see if there's a health concern or whether there may be problems with your monitor. If your blood pressure readings are suddenly higher than 180/120 mm Hg, wait at least one minute and test again. If your readings are still very high, contact your health care professional immediately. You could be having a hypertensive crisis. Call 911 if your blood pressure is higher than 180/120 mm Hg and if you are having new signs or symptoms that may include: Chest pain Shortness of breath Back pain Numbness Weakness Change in vision Difficulty speaking Confusion Dizziness Vomiting  Take care,  Jarold Motto PA-C

## 2023-08-09 NOTE — Progress Notes (Signed)
Teresa Bright is a 38 y.o. female here for a follow up of a pre-existing problem.  History of Present Illness:   Chief Complaint  Patient presents with   f/u elevated blood pressure    Pt has been checking blood pressure at home systolic 130-140, diastolic 90's.    HPI  Elevated Blood Pressure BP today is 122/90. At home BP readings have been in the 130-140's (mostly ) / 90-95's range. Patient reports tracking her caloric and sodium intake and working on adjusting her food choices.  Reports compliance and good tolerance of Norvasc 5 mg nightly. She is agreeable to a dose increase at this time.  Exercise levels have not increased since her last office visit.  She unsure if she's experiencing any sleep apnea symptoms but states that she tried wearing her smart watch when sleeping did not indicate any sleep disturbances.   Pill dysphagia She also reports taking omeprazole daily but states she would open capsule to take medication as she's unable to swallow it. Not taking medication causes her to experience chest pain most likely due to heartburn.  Denies any dysphagia with solids and/or liquids.   Past Medical History:  Diagnosis Date   Anxiety    GERD (gastroesophageal reflux disease)    History of chickenpox    Migraine    Pregnant 08/20/2014     Social History   Tobacco Use   Smoking status: Never   Smokeless tobacco: Never  Vaping Use   Vaping status: Never Used  Substance Use Topics   Alcohol use: Yes    Comment: social; weekend intake no binge drinking   Drug use: No    Past Surgical History:  Procedure Laterality Date   BREAST BIOPSY Right 2021   x2   BREAST BIOPSY Right 08/19/2022   [place] RT BREAST BX W LOC DEV 1ST LESION IMG BX SPEC US GUIDE 08/19/2022 [facility]   BREAST BIOPSY Left 08/19/2022   MM LT BREAST BX W LOC DEV 1ST LESION IMAGE BX SPEC STEREO GUIDE 08/19/2022 [facility]   BREAST REDUCTION SURGERY Bilateral 10/09/2020   Procedure:  BREAST REDUCTION;  Surgeon: Peggye Form, DO;  Location: [facility];  Service: Plastics;  Laterality: Bilateral;  3 hours   COLONOSCOPY     cysterectomy     LAPAROSCOPIC OVARIAN CYSTECTOMY Right 03/03/2016   Procedure: LAPAROSCOPIC RIGHT OVARIAN CYSTECTOMY AND RIGHT OOPHORECTOMY;  Surgeon: Lazaro Arms, MD;  Location: [facility];  Service: Gynecology;  Laterality: Right;   OOPHORECTOMY Right    REDUCTION MAMMAPLASTY Bilateral 09/2020   UPPER GI ENDOSCOPY     WISDOM TOOTH EXTRACTION      Family History  Problem Relation Age of Onset   Hypertension Mother    Hyperlipidemia Mother    Anxiety disorder Mother    Healthy Father    Healthy Brother    Cancer Maternal Grandmother        skin ; also intestinal malignancy in 61s   Diabetes Maternal Grandmother    Dementia Maternal Grandmother    Stroke Maternal Grandfather    Aneurysm Maternal Grandfather        brain   Cancer Paternal Grandmother        bone and breast   Breast cancer Paternal Grandmother        dx at >9 yo   Mental illness Maternal Uncle    Stroke Maternal Uncle    Lymphoma Paternal Aunt 18    No Known Allergies  Current  Medications:   Current Outpatient Medications:    amLODipine (NORVASC) 5 MG tablet, Take 1 tablet (5 mg total) by mouth daily., Disp: 30 tablet, Rfl: 1   busPIRone (BUSPAR) 15 MG tablet, Take 1 tablet (15 mg total) by mouth 2 (two) times daily., Disp: 180 tablet, Rfl: 2   omeprazole (PRILOSEC) 40 MG capsule, TAKE 1 CAPSULE BY MOUTH ONCE DAILY BEFORE BREAKFAST, Disp: 90 capsule, Rfl: 0   traZODone (DESYREL) 50 MG tablet, Take 0.5-1 tablets (25-50 mg total) by mouth at bedtime as needed for sleep., Disp: 30 tablet, Rfl: 1   Review of Systems:   Review of Systems  Gastrointestinal:  Positive for heartburn.    Vitals:   Vitals:   08/09/23 0834 08/09/23 0912  BP: (!) 122/90 124/88  Pulse: 74   Temp: (!) 97 F (36.1 C)   TempSrc: Temporal   SpO2: 97%   Weight: 223 lb  4 oz (101.3 kg)   Height: 5\' 4"  (1.626 m)      Body mass index is 38.32 kg/m.  Physical Exam:   Physical Exam Vitals and nursing note reviewed.  Constitutional:      General: She is not in acute distress.    Appearance: She is well-developed. She is not ill-appearing or toxic-appearing.  Cardiovascular:     Rate and Rhythm: Normal rate and regular rhythm.     Pulses: Normal pulses.     Heart sounds: Normal heart sounds, S1 normal and S2 normal.  Pulmonary:     Effort: Pulmonary effort is normal.     Breath sounds: Normal breath sounds.  Skin:    General: Skin is warm and dry.  Neurological:     Mental Status: She is alert.     GCS: GCS eye subscore is 4. GCS verbal subscore is 5. GCS motor subscore is 6.  Psychiatric:        Speech: Speech normal.        Behavior: Behavior normal. Behavior is cooperative.     Assessment and Plan:   Primary hypertension Above goal today No evidence of end-organ damage on my exam Recommend patient monitor home blood pressure at least a few times weekly Increase amlodipine to 10 mg daily Recommend considering of obstructive sleep apnea evaluation (discussed use of [employer] app) If home monitoring shows consistent elevation, or any symptom(s) develop, recommend reach out to [place] for further advice on next steps Message in 1 month average readings, or follow up for in-office visit  Pill dysphagia New issue Not consistently happening Discussed opening capsule and putting in applesauce (for her omeprazole) If symptom(s) worsen or persist, will likely need gastroenterology evaluation   Jarold Motto, PA-C  Ladona Mow Hewitt Shorts as a scribe for Jarold Motto, PA.,have documented all relevant documentation on the behalf of Jarold Motto, PA,as directed by  Jarold Motto, PA while in the presence of Jarold Motto, [place].   I, Jarold Motto, [place], have reviewed all documentation for this visit. The documentation on 08/09/23 for the  exam, diagnosis, procedures, and orders are all accurate and complete.

## 2023-08-18 ENCOUNTER — Other Ambulatory Visit: Payer: Self-pay | Admitting: Physician Assistant

## 2023-08-18 ENCOUNTER — Encounter: Payer: Self-pay | Admitting: Physician Assistant

## 2023-08-18 ENCOUNTER — Other Ambulatory Visit: Payer: Self-pay

## 2023-08-18 MED ORDER — AMLODIPINE BESYLATE 10 MG PO TABS: 10.0000 mg | ORAL_TABLET | Freq: Every day | ORAL | 0 refills | Status: DC

## 2023-08-23 ENCOUNTER — Ambulatory Visit
Admission: RE | Admit: 2023-08-23 | Discharge: 2023-08-23 | Disposition: A | Discharge status: Home / Self Care | Payer: Managed Care, Other (non HMO) | Source: Ambulatory Visit

## 2023-08-23 DIAGNOSIS — Z1231 Encounter for screening mammogram for malignant neoplasm of breast: Secondary | ICD-10-CM

## 2023-08-25 ENCOUNTER — Encounter: Payer: Self-pay | Admitting: Physician Assistant

## 2023-09-20 ENCOUNTER — Encounter: Payer: Self-pay | Admitting: Physician Assistant

## 2023-09-20 ENCOUNTER — Ambulatory Visit: Admitting: Physician Assistant

## 2023-09-20 VITALS — BP 122/80 | HR 115 | Temp 98.2°F | Ht 64.0 in | Wt 219.2 lb

## 2023-09-20 DIAGNOSIS — I10 Essential (primary) hypertension: Secondary | ICD-10-CM | POA: Diagnosis not present

## 2023-09-20 DIAGNOSIS — J029 Acute pharyngitis, unspecified: Secondary | ICD-10-CM

## 2023-09-20 LAB — POC INFLUENZA A&B (BINAX/QUICKVUE)
Influenza A, POC: NEGATIVE
Influenza B, POC: NEGATIVE

## 2023-09-20 LAB — POC COVID19 BINAXNOW: SARS Coronavirus 2 Ag: NEGATIVE

## 2023-09-20 MED ORDER — AMLODIPINE BESYLATE 10 MG PO TABS: 10.0000 mg | ORAL_TABLET | Freq: Every day | ORAL | 1 refills | Status: DC

## 2023-09-20 MED ORDER — AZITHROMYCIN 250 MG PO TABS: ORAL_TABLET | ORAL | 0 refills | Status: AC

## 2023-09-20 NOTE — Progress Notes (Signed)
 Teresa Bright is a 38 y.o. female here for a new problem.  History of Present Illness:   Chief Complaint  Patient presents with   Cough    Pt c/o cough, headache, body chills, fever last night 101.    Hypertension    HPI   Cough: Pt complains of cough, headaches, sinus pressure/ "head pressure", chills, and fever, starting last yesterday.  She reports a fever of 101 last night.  Took a Tylenol yesterday morning and last night  Has been staying well hydrated.  Denies any diarrhea or other GI symptoms.   She reports recent exposure to illness from her husband.  Her husband was negative for strep at the [facility], but has a hx of strep and believes he may be a carrier.   Hypertension: Pt is on Amlodipine 10 mg daily with good compliance and tolerance.  She has been monitoring her BP regularly.  Reports an average BP of 116/79 at home.  Denies any BLE edema.   Past Medical History:  Diagnosis Date   Anxiety    GERD (gastroesophageal reflux disease)    History of chickenpox    Migraine    Pregnant 08/20/2014     Social History   Tobacco Use   Smoking status: Never   Smokeless tobacco: Never  Vaping Use   Vaping status: Never Used  Substance Use Topics   Alcohol use: Yes    Comment: social; weekend intake no binge drinking   Drug use: No    Past Surgical History:  Procedure Laterality Date   BREAST BIOPSY Right 2021   x2   BREAST BIOPSY Right 08/19/2022   [place] RT BREAST BX W LOC DEV 1ST LESION IMG BX SPEC US GUIDE 08/19/2022 [facility]   BREAST BIOPSY Left 08/19/2022   MM LT BREAST BX W LOC DEV 1ST LESION IMAGE BX SPEC STEREO GUIDE 08/19/2022 [facility]   BREAST REDUCTION SURGERY Bilateral 10/09/2020   Procedure: BREAST REDUCTION;  Surgeon: Peggye Form, DO;  Location: [facility];  Service: Plastics;  Laterality: Bilateral;  3 hours   COLONOSCOPY     cysterectomy     LAPAROSCOPIC OVARIAN CYSTECTOMY Right 03/03/2016    Procedure: LAPAROSCOPIC RIGHT OVARIAN CYSTECTOMY AND RIGHT OOPHORECTOMY;  Surgeon: Lazaro Arms, MD;  Location: [facility];  Service: Gynecology;  Laterality: Right;   OOPHORECTOMY Right    REDUCTION MAMMAPLASTY Bilateral 09/2020   UPPER GI ENDOSCOPY     WISDOM TOOTH EXTRACTION      Family History  Problem Relation Age of Onset   Hypertension Mother    Hyperlipidemia Mother    Anxiety disorder Mother    Healthy Father    Healthy Brother    Cancer Maternal Grandmother        skin ; also intestinal malignancy in 72s   Diabetes Maternal Grandmother    Dementia Maternal Grandmother    Stroke Maternal Grandfather    Aneurysm Maternal Grandfather        brain   Cancer Paternal Grandmother        bone and breast   Breast cancer Paternal Grandmother        dx at >22 yo   Mental illness Maternal Uncle    Stroke Maternal Uncle    Lymphoma Paternal Aunt 44    No Known Allergies  Current Medications:   Current Outpatient Medications:    azithromycin (ZITHROMAX) 250 MG tablet, Take 2 tablets on day 1, then 1 tablet daily on days  2 through 5, Disp: 6 tablet, Rfl: 0   busPIRone (BUSPAR) 15 MG tablet, Take 1 tablet (15 mg total) by mouth 2 (two) times daily., Disp: 180 tablet, Rfl: 2   omeprazole (PRILOSEC) 40 MG capsule, TAKE 1 CAPSULE BY MOUTH ONCE DAILY BEFORE BREAKFAST, Disp: 90 capsule, Rfl: 0   traZODone (DESYREL) 50 MG tablet, Take 0.5-1 tablets (25-50 mg total) by mouth at bedtime as needed for sleep., Disp: 30 tablet, Rfl: 1   amLODipine (NORVASC) 10 MG tablet, Take 1 tablet (10 mg total) by mouth daily., Disp: 90 tablet, Rfl: 1   Review of Systems:   Negative unless otherwise specified per HPI.  Vitals:   Vitals:   09/20/23 1124  BP: 122/80  Pulse: (!) 115  Temp: 98.2 F (36.8 C)  TempSrc: Temporal  SpO2: 97%  Weight: 219 lb 4 oz (99.5 kg)  Height: 5\' 4"  (1.626 m)     Body mass index is 37.63 kg/m.  Physical Exam:   Physical Exam Vitals and nursing note  reviewed.  Constitutional:      General: She is not in acute distress.    Appearance: She is well-developed. She is not ill-appearing or toxic-appearing.  HENT:     Head: Normocephalic and atraumatic.     Right Ear: Tympanic membrane, ear canal and external ear normal. Tympanic membrane is not erythematous, retracted or bulging.     Left Ear: Tympanic membrane, ear canal and external ear normal. Tympanic membrane is not erythematous, retracted or bulging.     Nose: Nose normal.     Right Sinus: No maxillary sinus tenderness or frontal sinus tenderness.     Left Sinus: No maxillary sinus tenderness or frontal sinus tenderness.     Mouth/Throat:     Pharynx: Uvula midline. Posterior oropharyngeal erythema present.  Eyes:     General: Lids are normal.     Conjunctiva/sclera: Conjunctivae normal.  Neck:     Trachea: Trachea normal.  Cardiovascular:     Rate and Rhythm: Regular rhythm. Tachycardia present.     Pulses: Normal pulses.     Heart sounds: Normal heart sounds, S1 normal and S2 normal.  Pulmonary:     Effort: Pulmonary effort is normal.     Breath sounds: Normal breath sounds. No decreased breath sounds, wheezing, rhonchi or rales.  Lymphadenopathy:     Cervical: No cervical adenopathy.  Skin:    General: Skin is warm and dry.  Neurological:     Mental Status: She is alert.     GCS: GCS eye subscore is 4. GCS verbal subscore is 5. GCS motor subscore is 6.  Psychiatric:        Speech: Speech normal.        Behavior: Behavior normal. Behavior is cooperative.    Results for orders placed or performed in visit on 09/20/23  POC Influenza A&B(BINAX/QUICKVUE)  Result Value Ref Range   Influenza A, POC Negative Negative   Influenza B, POC Negative Negative  POC COVID-19  Result Value Ref Range   SARS Coronavirus 2 Ag Negative Negative     Assessment and Plan:   1. Sore throat (Primary) No red flags on exam.   Unable to perform strep test due to gag reflex Will  initiate azithromycin for presumed strep per orders.  Discussed taking medications as prescribed.  Reviewed return precautions including new or worsening fever, SOB, new or worsening cough or other concerns.  Push fluids and rest.  I recommend that patient follow-up if  symptoms worsen or persist despite treatment x 7-10 days, sooner if needed. - POC Influenza A&B(BINAX/QUICKVUE) - POC COVID-19  2. Primary hypertension Normotensive Continue amlodipine 10 mg daily Follow-up in September when due for CPE, sooner if concerns  I, Isabelle Course, acting as a Neurosurgeon for Jarold Motto, [place]., have documented all relevant documentation on the behalf of Jarold Motto, [place], as directed by  Jarold Motto, PA while in the presence of Jarold Motto, [place].  I, Jarold Motto, [place], have reviewed all documentation for this visit. The documentation on 09/20/23 for the exam, diagnosis, procedures, and orders are all accurate and complete.  Jarold Motto, PA-C

## 2023-09-21 ENCOUNTER — Ambulatory Visit: Admitting: Physician Assistant

## 2024-02-06 ENCOUNTER — Other Ambulatory Visit: Payer: Self-pay | Admitting: Physician Assistant

## 2024-03-14 ENCOUNTER — Encounter: Payer: Self-pay | Admitting: Physician Assistant

## 2024-03-14 ENCOUNTER — Ambulatory Visit (INDEPENDENT_AMBULATORY_CARE_PROVIDER_SITE_OTHER): Admitting: Physician Assistant

## 2024-03-14 VITALS — BP 110/76 | HR 66 | Temp 97.6°F | Ht 64.0 in | Wt 221.0 lb

## 2024-03-14 DIAGNOSIS — F419 Anxiety disorder, unspecified: Secondary | ICD-10-CM

## 2024-03-14 DIAGNOSIS — E88819 Insulin resistance, unspecified: Secondary | ICD-10-CM

## 2024-03-14 DIAGNOSIS — N939 Abnormal uterine and vaginal bleeding, unspecified: Secondary | ICD-10-CM

## 2024-03-14 DIAGNOSIS — Z23 Encounter for immunization: Secondary | ICD-10-CM

## 2024-03-14 DIAGNOSIS — Z136 Encounter for screening for cardiovascular disorders: Secondary | ICD-10-CM

## 2024-03-14 DIAGNOSIS — Z0000 Encounter for general adult medical examination without abnormal findings: Secondary | ICD-10-CM

## 2024-03-14 DIAGNOSIS — L989 Disorder of the skin and subcutaneous tissue, unspecified: Secondary | ICD-10-CM

## 2024-03-14 DIAGNOSIS — I10 Essential (primary) hypertension: Secondary | ICD-10-CM | POA: Diagnosis not present

## 2024-03-14 DIAGNOSIS — Z13220 Encounter for screening for lipoid disorders: Secondary | ICD-10-CM | POA: Diagnosis not present

## 2024-03-14 LAB — LIPID PANEL
Cholesterol: 216 mg/dL — ABNORMAL HIGH (ref 0–200)
HDL: 53.4 mg/dL (ref >39.00)
LDL Cholesterol: 142 mg/dL — ABNORMAL HIGH (ref 0–99)
NonHDL: 162.55
Total CHOL/HDL Ratio: 4
Triglycerides: 104 mg/dL (ref 0.0–149.0)
VLDL: 20.8 mg/dL (ref 0.0–40.0)

## 2024-03-14 LAB — COMPREHENSIVE METABOLIC PANEL WITH GFR
ALT: 19 U/L (ref 0–35)
AST: 17 U/L (ref 0–37)
Albumin: 4.3 g/dL (ref 3.5–5.2)
Alkaline Phosphatase: 60 U/L (ref 39–117)
BUN: 11 mg/dL (ref 6–23)
CO2: 28 meq/L (ref 19–32)
Calcium: 9.3 mg/dL (ref 8.4–10.5)
Chloride: 104 meq/L (ref 96–112)
Creatinine, Ser: 0.7 mg/dL (ref 0.40–1.20)
GFR: 109.34 mL/min (ref >60.00)
Glucose, Bld: 96 mg/dL (ref 70–99)
Potassium: 4 meq/L (ref 3.5–5.1)
Sodium: 138 meq/L (ref 135–145)
Total Bilirubin: 0.8 mg/dL (ref 0.2–1.2)
Total Protein: 6.9 g/dL (ref 6.0–8.3)

## 2024-03-14 LAB — CBC WITH DIFFERENTIAL/PLATELET
Basophils Absolute: 0 K/uL (ref 0.0–0.1)
Basophils Relative: 0.7 % (ref 0.0–3.0)
Eosinophils Absolute: 0.1 K/uL (ref 0.0–0.7)
Eosinophils Relative: 1.4 % (ref 0.0–5.0)
HCT: 40.2 % (ref 36.0–46.0)
Hemoglobin: 13.2 g/dL (ref 12.0–15.0)
Lymphocytes Relative: 32.5 % (ref 12.0–46.0)
Lymphs Abs: 1.9 K/uL (ref 0.7–4.0)
MCHC: 32.8 g/dL (ref 30.0–36.0)
MCV: 85.6 fl (ref 78.0–100.0)
Monocytes Absolute: 0.5 K/uL (ref 0.1–1.0)
Monocytes Relative: 8.3 % (ref 3.0–12.0)
Neutro Abs: 3.4 K/uL (ref 1.4–7.7)
Neutrophils Relative %: 57.1 % (ref 43.0–77.0)
Platelets: 345 K/uL (ref 150.0–400.0)
RBC: 4.69 Mil/uL (ref 3.87–5.11)
RDW: 13.3 % (ref 11.5–15.5)
WBC: 5.9 K/uL (ref 4.0–10.5)

## 2024-03-14 LAB — CORTISOL: Cortisol, Plasma: 4.8 ug/dL

## 2024-03-14 LAB — TSH: TSH: 0.6 u[IU]/mL (ref 0.35–5.50)

## 2024-03-14 LAB — HEMOGLOBIN A1C: Hgb A1c MFr Bld: 6.1 % (ref 4.6–6.5)

## 2024-03-14 MED ORDER — CITALOPRAM HYDROBROMIDE 10 MG PO TABS: 10.0000 mg | ORAL_TABLET | Freq: Every day | ORAL | 0 refills | Status: DC

## 2024-03-14 NOTE — Patient Instructions (Signed)
 It was great to see you!  Start celexa  10 mg daily Stop buspar   Message me in 4-6 weeks to update me on celexa , sooner if concerns  Let's follow-up in 1 year, sooner if you have concerns.  Take care,  Lucie Buttner PA-C

## 2024-03-14 NOTE — Progress Notes (Signed)
 Subjective:    Teresa Bright is a 38 y.o. female and is here for a comprehensive physical exam.  HPI  Health Maintenance Due  Topic Date Due   Influenza Vaccine  01/20/2024    Teresa Bright is a 38 year old female who presents with concerns about perimenopausal symptoms and anxiety.  She experiences irregular menstrual cycles, with spotting last week, which is atypical for her. Her cycles are usually regular every 30 days. She feels anxious, describing a constant state of tension and lack of relaxation. She takes Buspar  daily in the mornings and occasionally uses trazodone , which causes grogginess. She is considering other medication options for anxiety.  She manages her blood pressure with amlodipine  and monitors it at home without experiencing leg swelling. She has a history of benign breast cysts and fat necrosis, monitored with mammograms, with the latest imaging in March.   She takes omeprazole  for acid reflux on a stable regimen, though certain foods can trigger symptoms. Her mood is generally low, and she acknowledges insufficient exercise. Alcohol consumption is rare and social, and she does not smoke.  Health Maintenance: Immunizations -- utd Colonoscopy -- completed in 2023 for problem issue; start screening in  Mammogram -- UpToDate -- due at age 74 PAP -- UpToDate  Bone Density -- N/A  Diet -- variable diet Exercise -- not regularly  Sleep habits -- not great -- trazodone  makes her groggy  Mood -- taking buspar ; not super effective  UTD with dentist? - yes UTD with eye doctor? - yes  Weight history: Wt Readings from Last 10 Encounters:  03/14/24 221 lb (100.2 kg)  09/20/23 219 lb 4 oz (99.5 kg)  08/09/23 223 lb 4 oz (101.3 kg)  06/28/23 227 lb 6.1 oz (103.1 kg)  02/23/23 234 lb 4 oz (106.3 kg)  01/26/23 234 lb 4 oz (106.3 kg)  10/13/22 238 lb 8 oz (108.2 kg)  08/20/22 237 lb 12.8 oz (107.9 kg)  04/21/22 236 lb 8 oz (107.3 kg)  04/07/22 235 lb (106.6 kg)    Body mass index is 37.93 kg/m. Patient's last menstrual period was 02/26/2024 (exact date).  Alcohol use:  reports current alcohol use.  Tobacco use:  Tobacco Use: Low Risk  (03/14/2024)   Patient History    Smoking Tobacco Use: Never    Smokeless Tobacco Use: Never    Passive Exposure: Not on file   Eligible for lung cancer screening? no     03/14/2024    8:50 AM  Depression screen PHQ 2/9  Decreased Interest 0  Down, Depressed, Hopeless 0  PHQ - 2 Score 0     Other providers/specialists: Patient Care Team: Job Lukes, [place] as PCP - General (Physician Assistant)    PMHx, SurgHx, SocialHx, Medications, and Allergies were reviewed in the Visit Navigator and updated as appropriate.   Past Medical History:  Diagnosis Date   Anxiety    GERD (gastroesophageal reflux disease)    History of chickenpox    Migraine    Pregnant 08/20/2014     Past Surgical History:  Procedure Laterality Date   BREAST BIOPSY Right 2021   x2   BREAST BIOPSY Right 08/19/2022   US  RT BREAST BX W LOC DEV 1ST LESION IMG BX SPEC US  GUIDE 08/19/2022 [facility]   BREAST BIOPSY Left 08/19/2022   MM LT BREAST BX W LOC DEV 1ST LESION IMAGE BX SPEC STEREO GUIDE 08/19/2022 [facility]   BREAST REDUCTION SURGERY Bilateral 10/09/2020   Procedure: BREAST REDUCTION;  Surgeon: Lowery Estefana RAMAN, DO;  [facility];  Service: Plastics;  Laterality: Bilateral;  3 hours   COLONOSCOPY     cysterectomy     LAPAROSCOPIC OVARIAN CYSTECTOMY Right 03/03/2016   Procedure: LAPAROSCOPIC RIGHT OVARIAN CYSTECTOMY AND RIGHT OOPHORECTOMY;  Surgeon: Vonn VEAR Inch, MD;  Location: [facility];  Service: Gynecology;  Laterality: Right;   OOPHORECTOMY Right    REDUCTION MAMMAPLASTY Bilateral 09/2020   UPPER GI ENDOSCOPY     WISDOM TOOTH EXTRACTION       Family History  Problem Relation Age of Onset   Hypertension Mother    Hyperlipidemia Mother    Anxiety disorder Mother     Healthy Father    Healthy Brother    Cancer Maternal Grandmother        skin ; also intestinal malignancy in 48s   Diabetes Maternal Grandmother    Dementia Maternal Grandmother    Stroke Maternal Grandfather    Aneurysm Maternal Grandfather        brain   Cancer Paternal Grandmother        bone and breast   Breast cancer Paternal Grandmother        dx at >9 yo   Mental illness Maternal Uncle    Stroke Maternal Uncle    Lymphoma Paternal Aunt 44    Social History   Tobacco Use   Smoking status: Never   Smokeless tobacco: Never  Vaping Use   Vaping status: Never Used  Substance Use Topics   Alcohol use: Yes    Comment: social; weekend intake no binge drinking   Drug use: No    Review of Systems:   Review of Systems  Constitutional:  Negative for chills, fever, malaise/fatigue and weight loss.  HENT:  Negative for hearing loss, sinus pain and sore throat.   Respiratory:  Negative for cough and hemoptysis.   Cardiovascular:  Negative for chest pain, palpitations, leg swelling and PND.  Gastrointestinal:  Negative for abdominal pain, constipation, diarrhea, heartburn, nausea and vomiting.  Genitourinary:  Negative for dysuria, frequency and urgency.  Musculoskeletal:  Negative for back pain, myalgias and neck pain.  Skin:  Negative for itching and rash.  Neurological:  Negative for dizziness, tingling, seizures and headaches.  Endo/Heme/Allergies:  Negative for polydipsia.  Psychiatric/Behavioral:  Negative for depression. The patient is nervous/anxious.     Objective:   BP 110/76 (BP Location: Left Arm, Patient Position: Sitting, Cuff Size: Large)   Pulse 66   Temp 97.6 F (36.4 C) (Temporal)   Ht 5' 4 (1.626 m)   Wt 221 lb (100.2 kg)   LMP 02/26/2024 (Exact Date)   SpO2 97%   BMI 37.93 kg/m  Body mass index is 37.93 kg/m.   General Appearance:    Alert, cooperative, no distress, appears stated age  Head:    Normocephalic, without obvious abnormality,  atraumatic  Eyes:    PERRL, conjunctiva/corneas clear, EOM's intact, fundi    benign, both eyes  Ears:    Normal TM's and external ear canals, both ears  Nose:   Nares normal, septum midline, mucosa normal, no drainage    or sinus tenderness  Throat:   Lips, mucosa, and tongue normal; teeth and gums normal  Neck:   Supple, symmetrical, trachea midline, no adenopathy;    thyroid :  no enlargement/tenderness/nodules; no carotid   bruit or JVD  Back:     Symmetric, no curvature, ROM normal, no CVA tenderness  Lungs:  Clear to auscultation bilaterally, respirations unlabored  Chest Wall:    No tenderness or deformity   Heart:    Regular rate and rhythm, S1 and S2 normal, no murmur, rub or gallop  Breast Exam:    Deferred  Abdomen:     Soft, non-tender, bowel sounds active all four quadrants,    no masses, no organomegaly  Genitalia:    Deferred  Extremities:   Extremities normal, atraumatic, no cyanosis or edema  Pulses:   2+ and symmetric all extremities  Skin:   Skin color, texture, turgor normal, no rashes or lesions  Lymph nodes:   Cervical, supraclavicular, and axillary nodes normal  Neurologic:   CNII-XII intact, normal strength, sensation and reflexes    throughout    Assessment/Plan:   Comprehensive Physical Exam (CPE) preventive care annual visit  Routine wellness visit with no significant changes. Alcohol consumption is light and social. No smoking. Exercise and diet could be improved. Sleep and mood are suboptimal. - Perform routine blood work including thyroid , cholesterol, and A1c. - Ensure flu vaccination is up to date. - Encourage tracking of menstrual cycles.  Anxiety  Experiencing anxiety, particularly social anxiety. Discussed transitioning to Celexa , an SSRI, for anxiety and menopausal symptoms. Celexa  expected to have fewer side effects and requires consistent daily use. Initial side effects may include nausea, diarrhea, or headache, resolving in a few days.  Effects may take up to four weeks. - Discontinue Buspar . - Start Celexa  daily. - Monitor for side effects and effectiveness, report back in 4-6 weeks.  Abnormal uterine bleeding Experiencing spotting outside of regular menstrual periods. Discussed early perimenopause and importance of tracking menstrual cycles. - Track menstrual cycles using an app or other method.  Essential hypertension Blood pressure well-controlled on amlodipine . Home monitoring shows normal readings. - Continue amlodipine  10 mg daily - Monitor blood pressure at home.  Insulin resistance Previously identified at the very end of the prediabetes range. - Check A1c as part of blood work.  Skin abnormalities Presence of a spot on the nose and a skin tag on the arm. Long wait times for dermatology expected. - Refer to dermatology for evaluation and possible removal of skin lesions.  Encounter for lipid screening Update blood work today and provide recommendations   Lucie Buttner, PA-C [facility]

## 2024-03-15 ENCOUNTER — Ambulatory Visit: Payer: Self-pay | Admitting: Physician Assistant

## 2024-03-20 ENCOUNTER — Encounter: Payer: Self-pay | Admitting: Physician Assistant

## 2024-03-20 ENCOUNTER — Ambulatory Visit: Admitting: Physician Assistant

## 2024-03-20 VITALS — BP 110/72 | HR 71 | Temp 98.0°F | Ht 64.0 in | Wt 221.0 lb

## 2024-03-20 DIAGNOSIS — E669 Obesity, unspecified: Secondary | ICD-10-CM | POA: Diagnosis not present

## 2024-03-20 DIAGNOSIS — E88819 Insulin resistance, unspecified: Secondary | ICD-10-CM | POA: Diagnosis not present

## 2024-03-20 MED ORDER — METFORMIN HCL ER 500 MG PO TB24: 500.0000 mg | ORAL_TABLET | Freq: Every day | ORAL | 1 refills | Status: AC

## 2024-03-20 NOTE — Progress Notes (Signed)
 Teresa Bright is a 38 y.o. female here for a follow up of a pre-existing problem.  History of Present Illness:   Chief Complaint  Patient presents with   Lab results    Pt is here to discuss lab results from 03/14/2024.    Discussed the use of AI scribe software for clinical note transcription with the patient, who gave verbal consent to proceed.  History of Present Illness   Teresa Bright is a 38 year old female who presents for discussion about starting medication for prediabetes.  Her A1c level is 6.1, and she is considering starting metformin, understanding its potential side effects like stomach cramping and diarrhea. She has a family history of diabetes, with her grandmother having had the condition. She is not following a specific diet for blood sugar management and lacks knowledge about foods that affect her glucose levels. She is interested in weight loss but has not taken active steps. She acknowledges that even minimal exercise, such as walking for ten minutes daily, can be beneficial.  She is currently taking omeprazole  for recent indigestion and has started Celexa  without adverse effects. She is also on amlodipine  for blood pressure management.        Past Medical History:  Diagnosis Date   Anxiety    GERD (gastroesophageal reflux disease)    History of chickenpox    Migraine    Pregnant 08/20/2014     Social History   Tobacco Use   Smoking status: Never   Smokeless tobacco: Never  Vaping Use   Vaping status: Never Used  Substance Use Topics   Alcohol use: Yes    Comment: social; weekend intake no binge drinking   Drug use: No    Past Surgical History:  Procedure Laterality Date   BREAST BIOPSY Right 2021   x2   BREAST BIOPSY Right 08/19/2022   US  RT BREAST BX W LOC DEV 1ST LESION IMG BX SPEC US  GUIDE 08/19/2022 [facility]   BREAST BIOPSY Left 08/19/2022   MM LT BREAST BX W LOC DEV 1ST LESION IMAGE BX SPEC STEREO GUIDE 08/19/2022 [facility]    BREAST REDUCTION SURGERY Bilateral 10/09/2020   Procedure: BREAST REDUCTION;  Surgeon: Lowery Estefana RAMAN, DO;  Location: [facility];  Service: Plastics;  Laterality: Bilateral;  3 hours   COLONOSCOPY     cysterectomy     LAPAROSCOPIC OVARIAN CYSTECTOMY Right 03/03/2016   Procedure: LAPAROSCOPIC RIGHT OVARIAN CYSTECTOMY AND RIGHT OOPHORECTOMY;  Surgeon: Vonn VEAR Inch, MD;  Location: [facility];  Service: Gynecology;  Laterality: Right;   OOPHORECTOMY Right    REDUCTION MAMMAPLASTY Bilateral 09/2020   UPPER GI ENDOSCOPY     WISDOM TOOTH EXTRACTION      Family History  Problem Relation Age of Onset   Hypertension Mother    Hyperlipidemia Mother    Anxiety disorder Mother    Healthy Father    Healthy Brother    Cancer Maternal Grandmother        skin ; also intestinal malignancy in 17s   Diabetes Maternal Grandmother    Dementia Maternal Grandmother    Stroke Maternal Grandfather    Aneurysm Maternal Grandfather        brain   Cancer Paternal Grandmother        bone and breast   Breast cancer Paternal Grandmother        dx at >19 yo   Mental illness Maternal Uncle    Stroke Maternal Uncle    Heart disease  Maternal Uncle    Lymphoma Paternal Aunt 68   Heart disease Maternal Uncle     No Known Allergies  Current Medications:   Current Outpatient Medications:    amLODipine  (NORVASC ) 10 MG tablet, Take 1 tablet (10 mg total) by mouth daily., Disp: 90 tablet, Rfl: 1   citalopram  (CELEXA ) 10 MG tablet, Take 1 tablet (10 mg total) by mouth daily., Disp: 90 tablet, Rfl: 0   Multiple Vitamins-Minerals (ONE-A-DAY WOMENS PO), Take 1 tablet by mouth daily as needed., Disp: , Rfl:    omeprazole  (PRILOSEC) 40 MG capsule, TAKE 1 CAPSULE BY MOUTH ONCE DAILY BEFORE BREAKFAST, Disp: 90 capsule, Rfl: 0   traZODone  (DESYREL ) 50 MG tablet, Take 0.5-1 tablets (25-50 mg total) by mouth at bedtime as needed for sleep., Disp: 30 tablet, Rfl: 1   Review of Systems:   Negative  unless otherwise specified per HPI.  Vitals:   Vitals:   03/20/24 1320  BP: 110/72  Pulse: 71  Temp: 98 F (36.7 C)  TempSrc: Temporal  SpO2: 98%  Weight: 221 lb (100.2 kg)  Height: 5' 4 (1.626 m)     Body mass index is 37.93 kg/m.  Physical Exam:   Physical Exam Constitutional:      Appearance: Normal appearance. She is well-developed.  HENT:     Head: Normocephalic and atraumatic.  Eyes:     General: Lids are normal.     Extraocular Movements: Extraocular movements intact.     Conjunctiva/sclera: Conjunctivae normal.  Pulmonary:     Effort: Pulmonary effort is normal.  Musculoskeletal:        General: Normal range of motion.     Cervical back: Normal range of motion and neck supple.  Skin:    General: Skin is warm and dry.  Neurological:     Mental Status: She is alert and oriented to person, place, and time.  Psychiatric:        Attention and Perception: Attention and perception normal.        Mood and Affect: Mood normal.        Behavior: Behavior normal.        Thought Content: Thought content normal.        Judgment: Judgment normal.     Assessment and Plan:   Assessment and Plan    Insulin resistance A1c 6.1 indicates prediabetes with insulin resistance. Discussed metformin benefits, including blood sugar control and prevention of diabetes progression. Explained side effects and alternative treatments like weight loss injections. - Start metformin 500 mg once daily with food. - Provide dietary education on portion control and balanced meals. - Encourage regular exercise, such as walking for at least 10 minutes daily. - Recheck A1c in 3 months.  Obesity, unspecified class, unspecified obesity type, unspecified whether serious comorbidity present Discussed weight loss importance in managing prediabetes and insulin resistance. Emphasized dietary and exercise modifications. - Encourage dietary modifications focusing on portion control and balanced  meals. - Encourage regular exercise, such as walking for at least 10 minutes daily.    Lucie Buttner, PA-C

## 2024-05-11 ENCOUNTER — Telehealth: Admitting: Physician Assistant

## 2024-05-11 ENCOUNTER — Encounter: Payer: Self-pay | Admitting: Physician Assistant

## 2024-05-11 VITALS — Ht 64.0 in | Wt 215.0 lb

## 2024-05-11 DIAGNOSIS — J0110 Acute frontal sinusitis, unspecified: Secondary | ICD-10-CM | POA: Diagnosis not present

## 2024-05-11 MED ORDER — AMOXICILLIN-POT CLAVULANATE 875-125 MG PO TABS: 1.0000 | ORAL_TABLET | Freq: Two times a day (BID) | ORAL | 0 refills | Status: AC

## 2024-05-11 NOTE — Progress Notes (Signed)
 Virtual Visit via Video Note   I, Lucie Buttner, connected with  Teresa Bright  ([id number], 1986/03/06) on 05/11/24 at 11:20 AM EST by a video-enabled telemedicine application and verified that I am speaking with the correct person using two identifiers.  Location: Patient: Home Provider: [facility] [place] [facility] [place] office   I discussed the limitations of evaluation and management by telemedicine and the availability of in person appointments. The patient expressed understanding and agreed to proceed.    Discussed the use of AI scribe software for clinical note transcription with the patient, who gave verbal consent to proceed.  History of Present Illness   Teresa Bright is a 38 year old female who presents with persistent sinus congestion and cough.  Her symptoms began on November 8th with a runny nose. She initially took Claritin with a decongestant for three days, which provided some relief, but discontinued it due to concerns about blood pressure elevation. Her symptoms persisted, including congestion, runny nose, and watery eyes. She started using Flonase last week, which has helped dry up the congestion somewhat. Despite this, she continues to experience a productive cough with mucous production, affecting her sleep. She acknowledges inadequate water intake. Her son had similar symptoms prior, but her husband has remained well, taking zinc tablets. She has no known medication allergies.       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   Ovarian cyst, right 10/03/2014    Allergies: No Known Allergies Medications:  Current Outpatient Medications:    amLODipine  (NORVASC ) 10 MG tablet, Take 1 tablet (10 mg total) by mouth daily., Disp: 90 tablet, Rfl: 1   citalopram  (CELEXA ) 10 MG tablet, Take 1 tablet (10 mg total) by mouth daily., Disp: 90 tablet, Rfl: 0   metFORMIN  (GLUCOPHAGE -XR) 500 MG 24 hr tablet, Take 1 tablet (500 mg total) by mouth daily with breakfast., Disp: 90 tablet, Rfl: 1   Multiple Vitamins-Minerals (ONE-A-DAY WOMENS PO), Take 1 tablet by mouth daily as needed., Disp: , Rfl:    omeprazole  (PRILOSEC) 40 MG capsule, TAKE 1 CAPSULE BY MOUTH ONCE DAILY BEFORE BREAKFAST, Disp: 90 capsule, Rfl: 0   traZODone  (DESYREL ) 50 MG tablet, Take 0.5-1 tablets (25-50 mg total) by mouth at bedtime as needed for sleep., Disp: 30 tablet, Rfl: 1  Observations/Objective: Patient is well-developed, well-nourished in no acute distress.  Resting comfortably at home.  Head is normocephalic, atraumatic.  No labored breathing.  Speech is clear and coherent with logical content.  Patient is alert and oriented at baseline.   Assessment and Plan    Acute non-recurrent frontal sinusitis Symptoms persisted for three weeks with rhinorrhea, nasal congestion, epiphora, and productive cough. Flonase provided partial relief. Productive cough and sleep disturbance indicate bacterial sinus infection. - Prescribed augmentin  - Advised increased fluid intake. - Sent prescription to pharmacy.       Follow Up Instructions: I discussed the assessment and treatment plan with the patient. The patient was provided an opportunity to ask questions and all were answered.  The patient agreed with the plan and demonstrated an understanding of the instructions.  A copy of instructions were sent to the patient via MyChart unless otherwise noted below.   The patient was advised  to call back or seek an in-person evaluation if the symptoms worsen or if the condition fails to improve as anticipated.  Lucie Buttner, [place]

## 2024-05-16 ENCOUNTER — Other Ambulatory Visit: Payer: Self-pay | Admitting: Physician Assistant

## 2024-05-16 ENCOUNTER — Encounter: Payer: Self-pay | Admitting: Physician Assistant

## 2024-05-16 MED ORDER — BENZONATATE 100 MG PO CAPS: 100.0000 mg | ORAL_CAPSULE | Freq: Two times a day (BID) | ORAL | 0 refills | Status: AC | PRN

## 2024-05-16 MED ORDER — PROMETHAZINE-DM 6.25-15 MG/5ML PO SYRP: 5.0000 mL | ORAL_SOLUTION | Freq: Three times a day (TID) | ORAL | 0 refills | Status: AC | PRN

## 2024-05-23 ENCOUNTER — Other Ambulatory Visit: Payer: Self-pay | Admitting: Physician Assistant

## 2024-05-23 DIAGNOSIS — K219 Gastro-esophageal reflux disease without esophagitis: Secondary | ICD-10-CM

## 2024-05-28 ENCOUNTER — Other Ambulatory Visit: Payer: Self-pay | Admitting: Physician Assistant

## 2024-06-09 ENCOUNTER — Other Ambulatory Visit: Payer: Self-pay | Admitting: Physician Assistant

## 2024-06-12 ENCOUNTER — Ambulatory Visit: Admitting: Dermatology

## 2024-06-12 ENCOUNTER — Encounter: Payer: Self-pay | Admitting: Dermatology

## 2024-06-12 VITALS — BP 119/78 | HR 81

## 2024-06-12 DIAGNOSIS — W908XXA Exposure to other nonionizing radiation, initial encounter: Secondary | ICD-10-CM | POA: Diagnosis not present

## 2024-06-12 DIAGNOSIS — C4491 Basal cell carcinoma of skin, unspecified: Secondary | ICD-10-CM

## 2024-06-12 DIAGNOSIS — C44311 Basal cell carcinoma of skin of nose: Secondary | ICD-10-CM | POA: Diagnosis not present

## 2024-06-12 DIAGNOSIS — L578 Other skin changes due to chronic exposure to nonionizing radiation: Secondary | ICD-10-CM

## 2024-06-12 DIAGNOSIS — D489 Neoplasm of uncertain behavior, unspecified: Secondary | ICD-10-CM | POA: Diagnosis not present

## 2024-06-12 HISTORY — DX: Basal cell carcinoma of skin, unspecified: C44.91

## 2024-06-12 NOTE — Progress Notes (Signed)
" ° °  New Patient Visit   History of Present Illness Teresa Bright is a 38 year old female who presents with a spot on her nose.  She has had a spot on her nose for more than a year. The spot has changed over time, initially getting smaller after manipulation. It has bled when manipulated, but there is no associated itching or pain.  There is no history of similar spots or skin cancer. She does not recall any injury to her nose. She acknowledges a history of sun exposure over the years.  No itching or pain associated with the spot on her nose.   Patient states she has spot located at the nose that she would like to have examined. Patient reports the areas have been there for 1 year. She reports the areas are not bothersome. She states that the areas have not spread. Patient reports she has not previously been treated for these areas. Patient denies Hx of bx. Patient admits family history of skin cancer(s), patient states her grandmother but she is unsure of what kind.  The following portions of the chart were reviewed this encounter and updated as appropriate: medications, allergies, medical history  Review of Systems:  No other skin or systemic complaints except as noted in HPI or Assessment and Plan.  Objective  Well appearing patient in no apparent distress; mood and affect are within normal limits.   A focused examination was performed of the following areas: face  Relevant exam findings are noted in the Assessment and Plan.   Dorsum of Nose 0.5 cm pink pearly papule    Assessment & Plan    NEOPLASM OF UNCERTAIN BEHAVIOR Dorsum of Nose - Skin / nail biopsy Type of biopsy: tangential   Informed consent: discussed and consent obtained   Timeout: patient name, date of birth, surgical site, and procedure verified   Procedure prep:  Patient was prepped and draped in usual sterile fashion Prep type:  Isopropyl alcohol Anesthesia: the lesion was anesthetized in a standard  fashion   Anesthetic:  1% lidocaine  w/ epinephrine  1-100,000 buffered w/ 8.4% NaHCO3 Instrument used: flexible razor blade   Hemostasis achieved with: pressure, aluminum chloride and electrodesiccation   Outcome: patient tolerated procedure well   Post-procedure details: sterile dressing applied and wound care instructions given   Dressing type: bandage and petrolatum    Specimen 1 - Surgical pathology Differential Diagnosis: R/O BCC  Check Margins: No   ACTINIC DAMAGE - chronic, secondary to cumulative UV radiation exposure/sun exposure over time - diffuse scaly erythematous macules with underlying dyspigmentation - Recommend daily broad spectrum sunscreen SPF 30+ to sun-exposed areas, reapply every 2 hours as needed.  - Recommend staying in the shade or wearing long sleeves, sun glasses (UVA+UVB protection) and wide brim hats (4-inch brim around the entire circumference of the hat). - Call for new or changing lesions.  Return for Schedule with brenda ASAP for skin tags.  I, Doyce Pan, CMA, am acting as scribe for RUFUS CHRISTELLA HOLY, MD.   Documentation: I have reviewed the above documentation for accuracy and completeness, and I agree with the above.  RUFUS CHRISTELLA HOLY, MD     "

## 2024-06-12 NOTE — Patient Instructions (Signed)

## 2024-06-18 LAB — SURGICAL PATHOLOGY

## 2024-06-22 ENCOUNTER — Ambulatory Visit: Payer: Self-pay | Admitting: Dermatology

## 2024-06-22 NOTE — Telephone Encounter (Signed)
Lm for pt to call for results

## 2024-06-22 NOTE — Telephone Encounter (Signed)
-----   Message from Rufus Holy, MD sent at 06/22/2024 12:03 PM EST ----- BCC- nose- Mohs  Please call patient to discuss diagnosis and schedule for Mohs surgery.

## 2024-06-25 NOTE — Progress Notes (Signed)
 Spoke with pt gave her results and recommendations

## 2024-07-24 ENCOUNTER — Encounter: Admitting: Dermatology

## 2024-07-25 ENCOUNTER — Encounter: Payer: Self-pay | Admitting: Dermatology

## 2024-07-26 ENCOUNTER — Encounter: Payer: Self-pay | Admitting: Dermatology

## 2024-07-26 ENCOUNTER — Ambulatory Visit: Admitting: Dermatology

## 2024-07-26 VITALS — BP 116/71 | HR 74 | Temp 97.9°F

## 2024-07-26 DIAGNOSIS — C44311 Basal cell carcinoma of skin of nose: Secondary | ICD-10-CM

## 2024-07-26 DIAGNOSIS — L578 Other skin changes due to chronic exposure to nonionizing radiation: Secondary | ICD-10-CM

## 2024-07-26 DIAGNOSIS — L814 Other melanin hyperpigmentation: Secondary | ICD-10-CM

## 2024-07-26 DIAGNOSIS — C4491 Basal cell carcinoma of skin, unspecified: Secondary | ICD-10-CM

## 2024-07-26 MED ORDER — MUPIROCIN 2 % EX OINT: 1.0000 | TOPICAL_OINTMENT | Freq: Two times a day (BID) | CUTANEOUS | 1 refills | Status: AC

## 2024-07-26 NOTE — Patient Instructions (Signed)

## 2024-07-26 NOTE — Progress Notes (Signed)
 "  Follow-Up Visit   Subjective  Teresa Bright is a 39 y.o. 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 Corey  The following portions of the chart were reviewed this encounter and updated as appropriate: medications, allergies, medical history  Review of Systems:  No other skin or systemic complaints except as noted in HPI or Assessment and Plan.  Objective  Well appearing patient in no apparent distress; mood and affect are within normal limits.  A focused examination was performed of the following areas: Dorsum of nose Relevant physical exam findings are noted in the Assessment and Plan.   Dorsum of Nose Pink nodule   Assessment & Plan   BASAL CELL CARCINOMA (BCC), UNSPECIFIED SITE Dorsum of Nose - Mohs surgery  Consent obtained: written  Anticoagulation: Is the patient taking prescription anticoagulant and/or aspirin prescribed/recommended by a physician? No   Was the anticoagulation regimen changed prior to Mohs? No    Anesthesia: Anesthesia method: local infiltration Local anesthetic: lidocaine  1% WITH epi  Procedure Details: Timeout: pre-procedure verification complete Procedure Prep: patient was prepped and draped in usual sterile fashion Prep type: chlorhexidine  Biopsy accession number: [id number] Pre-Op diagnosis: basal cell carcinoma BCC subtype: nodular Surgical site (from skin exam): Dorsum of Nose Pre-operative length (cm): 0.5 Pre-operative width (cm): 0.3  Micrographic Surgery Details: Post-operative length (cm): 1 Post-operative width (cm): 1 Number of Mohs stages: 1 Post surgery depth of defect: subcutaneous fat  Stage 1    Tumor features identified on Mohs section: no tumor identified  Reconstruction: Was the defect reconstructed? Yes   Was reconstruction performed by the same Mohs surgeon? Yes   Setting of reconstruction: outpatient office When was  reconstruction performed? same day Type of reconstruction: linear Linear reconstruction: complex  - Skin repair Complexity:  Complex Final length (cm):  3 Informed consent: discussed and consent obtained   Timeout: patient name, date of birth, surgical site, and procedure verified   Procedure prep:  Patient was prepped and draped in usual sterile fashion Prep type:  Chlorhexidine  Anesthesia: the lesion was anesthetized in a standard fashion   Anesthetic:  1% lidocaine  w/ epinephrine  1-100,000 buffered w/ 8.4% NaHCO3 Reason for type of repair: reduce tension to allow closure, allow closure of the large defect and preserve normal anatomy   Undermining: area extensively undermined   Subcutaneous layers (deep stitches):  Suture size:  5-0 Suture type: Vicryl (polyglactin 910)   Stitches:  Buried vertical mattress Fine/surface layer approximation (top stitches):  Suture size:  6-0 Suture type: fast-absorbing plain gut   Stitches: simple running   Hemostasis achieved with: suture, pressure and electrodesiccation Outcome: patient tolerated procedure well with no complications   Post-procedure details: sterile dressing applied and wound care instructions given   Dressing type: bandage and pressure dressing      Return in about 4 weeks (around 08/23/2024) for wound check.  LILLETTE Darice Smock, CMA, am acting as scribe for RUFUS CHRISTELLA COREY, MD.    07/26/2024  HISTORY OF PRESENT ILLNESS  Teresa Bright is seen in consultation at the request of Dr. 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 They note that the area has been present for about 6 months increasing in size with time.  There is no history of previous treatment.  Reports no other new or changing lesions and has no other complaints today.  Medications and allergies: see patient chart.  Review of  systems: Reviewed 8 systems and notable for the above skin cancer.  All other systems reviewed are  unremarkable/negative, unless noted in the HPI. Past medical history, surgical history, family history, social history were also reviewed and are noted in the chart/questionnaire.    PHYSICAL EXAMINATION  General: Well-appearing, in no acute distress, alert and oriented x 4. Vitals reviewed in chart (if available).   Skin: Exam reveals a 0.5 x 0.3 cm erythematous papule and biopsy scar on the nasal dorsum. There are rhytids, telangiectasias, and lentigines, consistent with photodamage.  Biopsy report(s) reviewed, confirming the diagnosis.   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 Due to location, size, histology, or recurrence and the likelihood of subclinical extension as well as the need to conserve normal surrounding tissue, the patient was deemed acceptable for Mohs micrographic surgery (MMS).  The nature and purpose of the procedure, associated benefits and risks including recurrence and scarring, possible complications such as pain, infection, and bleeding, and alternative methods of treatment if appropriate were discussed with the patient during consent. The lesion location was verified by the patient, by reviewing previous notes, pathology reports, and by photographs as well as angulation measurements if available.  Informed consent was reviewed and signed by the patient, and timeout was performed at 7:45 AM. See op note below.  2. For the photodamage and solar lentigines, sun protection discussed/information given on OTC sunscreens, and we recommend continued regular follow-up with primary dermatologist every 6 months or sooner for any growing, bleeding, or changing lesions. 3. Prognosis and future surveillance discussed. 4. Letter with treatment outcome sent to referring provider. 5. Pain acetaminophen /ibuprofen    MOHS MICROGRAPHIC SURGERY AND RECONSTRUCTION  Initial size:   0.5 x 0.3 cm Surgical defect/wound  size: 1.0 x 1.0 cm Anesthesia:    0.33% lidocaine  with 1:200,000 epinephrine  EBL:    <5 mL Complications:  None Repair type:   Complex SQ suture:   5-0 Vicryl Cutaneous suture:  6-0 Plain gut Final size of the repair: 3.0 cm   Stages: 1  STAGE I: Anesthesia achieved with 0.5% lidocaine  with 1:200,000 epinephrine . ChloraPrep applied. 1 section(s) excised using Mohs technique (this includes total peripheral and deep tissue margin excision and evaluation with frozen sections, excised and interpreted by the same physician). The tumor was first debulked and then excised with an approx. 2mm margin.  Hemostasis was achieved with electrocautery as needed.  The specimen was then oriented, subdivided/relaxed, inked, and processed using Mohs technique.    Frozen section analysis revealed a clear deep and peripheral margin.  Reconstruction  The surgical wound was then cleaned, prepped, and re-anesthetized as above. Wound edges were undermined extensively along at least one entire edge and at a distance equal to or greater than the width of the defect (see wound defect size above) in order to achieve closure and decrease wound tension and anatomic distortion. Redundant tissue repair including standing cone removal was performed. Hemostasis was achieved with electrocautery. Subcutaneous and epidermal tissues were approximated with the above sutures. The surgical site was then lightly scrubbed with sterile, saline-soaked gauze. The area was then bandaged using Vaseline ointment, non-adherent gauze, gauze pads, and tape to provide an adequate pressure dressing. The patient tolerated the procedure well, was given detailed written and verbal wound care instructions, and was discharged in good condition.   The patient will follow-up: 4 weeks.    Documentation: I have reviewed the above  documentation for accuracy and completeness, and I agree with the above.  RUFUS CHRISTELLA HOLY, MD  "

## 2024-08-28 ENCOUNTER — Ambulatory Visit: Admitting: Dermatology

## 2024-09-10 ENCOUNTER — Ambulatory Visit: Admitting: Physician Assistant
